# Patient Record
Sex: Female | Born: 1965
Health system: Southern US, Community
[De-identification: ages and names within clinical notes are randomized; demographics above are authoritative.]

## PROBLEM LIST (undated history)

## (undated) DIAGNOSIS — C50919 Malignant neoplasm of unspecified site of unspecified female breast: Secondary | ICD-10-CM

## (undated) DIAGNOSIS — D649 Anemia, unspecified: Secondary | ICD-10-CM

## (undated) DIAGNOSIS — F419 Anxiety disorder, unspecified: Secondary | ICD-10-CM

## (undated) DIAGNOSIS — R232 Flushing: Secondary | ICD-10-CM

## (undated) DIAGNOSIS — N760 Acute vaginitis: Principal | ICD-10-CM

## (undated) DIAGNOSIS — C801 Malignant (primary) neoplasm, unspecified: Secondary | ICD-10-CM

## (undated) DIAGNOSIS — T7840XA Allergy, unspecified, initial encounter: Secondary | ICD-10-CM

## (undated) DIAGNOSIS — Z9889 Other specified postprocedural states: Secondary | ICD-10-CM

## (undated) DIAGNOSIS — Z8619 Personal history of other infectious and parasitic diseases: Secondary | ICD-10-CM

## (undated) DIAGNOSIS — B019 Varicella without complication: Secondary | ICD-10-CM

## (undated) DIAGNOSIS — N390 Urinary tract infection, site not specified: Principal | ICD-10-CM

## (undated) DIAGNOSIS — Z8709 Personal history of other diseases of the respiratory system: Secondary | ICD-10-CM

## (undated) DIAGNOSIS — Z1501 Genetic susceptibility to malignant neoplasm of breast: Secondary | ICD-10-CM

## (undated) DIAGNOSIS — R112 Nausea with vomiting, unspecified: Secondary | ICD-10-CM

## (undated) DIAGNOSIS — Z0000 Encounter for general adult medical examination without abnormal findings: Secondary | ICD-10-CM

## (undated) DIAGNOSIS — G4700 Insomnia, unspecified: Secondary | ICD-10-CM

## (undated) DIAGNOSIS — Z1509 Genetic susceptibility to other malignant neoplasm: Secondary | ICD-10-CM

## (undated) DIAGNOSIS — Z9221 Personal history of antineoplastic chemotherapy: Secondary | ICD-10-CM

## (undated) DIAGNOSIS — M810 Age-related osteoporosis without current pathological fracture: Secondary | ICD-10-CM

## (undated) HISTORY — DX: Personal history of other diseases of the respiratory system: Z87.09

## (undated) HISTORY — DX: Other specified postprocedural states: R11.2

## (undated) HISTORY — DX: Nausea with vomiting, unspecified: Z98.890

## (undated) HISTORY — PX: VARICOSE VEIN SURGERY: SHX832

## (undated) HISTORY — DX: Flushing: R23.2

## (undated) HISTORY — DX: Insomnia, unspecified: G47.00

## (undated) HISTORY — DX: Anemia, unspecified: D64.9

## (undated) HISTORY — DX: Genetic susceptibility to other malignant neoplasm: Z15.09

## (undated) HISTORY — DX: Personal history of other infectious and parasitic diseases: Z86.19

## (undated) HISTORY — PX: POLYPECTOMY: SHX149

## (undated) HISTORY — PX: COLONOSCOPY: SHX174

## (undated) HISTORY — DX: Allergy, unspecified, initial encounter: T78.40XA

## (undated) HISTORY — DX: Age-related osteoporosis without current pathological fracture: M81.0

## (undated) HISTORY — DX: Personal history of antineoplastic chemotherapy: Z92.21

## (undated) HISTORY — DX: Acute vaginitis: N76.0

## (undated) HISTORY — DX: Genetic susceptibility to malignant neoplasm of breast: Z15.01

## (undated) HISTORY — DX: Urinary tract infection, site not specified: N39.0

## (undated) HISTORY — DX: Encounter for general adult medical examination without abnormal findings: Z00.00

## (undated) HISTORY — DX: Varicella without complication: B01.9

## (undated) HISTORY — PX: TUBAL LIGATION: SHX77

## (undated) HISTORY — DX: Anxiety disorder, unspecified: F41.9

## (undated) HISTORY — DX: Malignant neoplasm of unspecified site of unspecified female breast: C50.919

---

## 1995-01-29 HISTORY — PX: MOUTH SURGERY: SHX715

## 1997-11-04 ENCOUNTER — Inpatient Hospital Stay (HOSPITAL_COMMUNITY): Admission: AD | Admit: 1997-11-04 | Discharge: 1997-11-08 | Payer: Self-pay | Admitting: Obstetrics and Gynecology

## 2000-07-07 ENCOUNTER — Other Ambulatory Visit: Admission: RE | Admit: 2000-07-07 | Discharge: 2000-07-07 | Payer: Self-pay | Admitting: Obstetrics and Gynecology

## 2001-07-09 ENCOUNTER — Other Ambulatory Visit: Admission: RE | Admit: 2001-07-09 | Discharge: 2001-07-09 | Payer: Self-pay | Admitting: Obstetrics and Gynecology

## 2001-07-28 ENCOUNTER — Ambulatory Visit (HOSPITAL_COMMUNITY): Admission: RE | Admit: 2001-07-28 | Discharge: 2001-07-28 | Payer: Self-pay | Admitting: Obstetrics and Gynecology

## 2001-07-28 ENCOUNTER — Encounter: Payer: Self-pay | Admitting: Obstetrics and Gynecology

## 2002-07-13 ENCOUNTER — Other Ambulatory Visit: Admission: RE | Admit: 2002-07-13 | Discharge: 2002-07-13 | Payer: Self-pay | Admitting: Obstetrics and Gynecology

## 2003-11-28 ENCOUNTER — Other Ambulatory Visit: Admission: RE | Admit: 2003-11-28 | Discharge: 2003-11-28 | Payer: Self-pay | Admitting: Obstetrics and Gynecology

## 2004-11-29 ENCOUNTER — Other Ambulatory Visit: Admission: RE | Admit: 2004-11-29 | Discharge: 2004-11-29 | Payer: Self-pay | Admitting: Obstetrics and Gynecology

## 2005-12-03 ENCOUNTER — Other Ambulatory Visit: Admission: RE | Admit: 2005-12-03 | Discharge: 2005-12-03 | Payer: Self-pay | Admitting: Obstetrics and Gynecology

## 2006-10-02 ENCOUNTER — Ambulatory Visit (HOSPITAL_COMMUNITY): Admission: RE | Admit: 2006-10-02 | Discharge: 2006-10-02 | Payer: Self-pay | Admitting: Obstetrics and Gynecology

## 2006-12-04 ENCOUNTER — Other Ambulatory Visit: Admission: RE | Admit: 2006-12-04 | Discharge: 2006-12-04 | Payer: Self-pay | Admitting: Obstetrics and Gynecology

## 2007-01-29 LAB — HM MAMMOGRAPHY

## 2007-12-11 ENCOUNTER — Other Ambulatory Visit: Admission: RE | Admit: 2007-12-11 | Discharge: 2007-12-11 | Payer: Self-pay | Admitting: Obstetrics and Gynecology

## 2008-01-19 ENCOUNTER — Ambulatory Visit (HOSPITAL_COMMUNITY): Admission: RE | Admit: 2008-01-19 | Discharge: 2008-01-19 | Payer: Self-pay | Admitting: Obstetrics and Gynecology

## 2008-10-21 ENCOUNTER — Ambulatory Visit: Payer: Self-pay | Admitting: Family Medicine

## 2008-10-21 DIAGNOSIS — N3000 Acute cystitis without hematuria: Secondary | ICD-10-CM | POA: Insufficient documentation

## 2008-10-22 ENCOUNTER — Encounter: Payer: Self-pay | Admitting: Family Medicine

## 2008-12-11 ENCOUNTER — Other Ambulatory Visit: Admission: RE | Admit: 2008-12-11 | Discharge: 2008-12-11 | Payer: Self-pay | Admitting: Obstetrics and Gynecology

## 2010-11-17 ENCOUNTER — Encounter: Payer: Self-pay | Admitting: Emergency Medicine

## 2010-11-17 ENCOUNTER — Inpatient Hospital Stay (INDEPENDENT_AMBULATORY_CARE_PROVIDER_SITE_OTHER)
Admission: RE | Admit: 2010-11-17 | Discharge: 2010-11-17 | Disposition: A | Discharge status: Home / Self Care | Payer: BC Managed Care – PPO | Source: Ambulatory Visit | Attending: Emergency Medicine | Admitting: Emergency Medicine

## 2010-11-17 DIAGNOSIS — L2089 Other atopic dermatitis: Secondary | ICD-10-CM

## 2010-11-19 ENCOUNTER — Encounter: Payer: Self-pay | Admitting: Family Medicine

## 2010-12-03 ENCOUNTER — Encounter: Payer: Self-pay | Admitting: Family Medicine

## 2010-12-03 ENCOUNTER — Ambulatory Visit (INDEPENDENT_AMBULATORY_CARE_PROVIDER_SITE_OTHER): Payer: BC Managed Care – PPO | Admitting: Family Medicine

## 2010-12-03 DIAGNOSIS — T7840XA Allergy, unspecified, initial encounter: Secondary | ICD-10-CM

## 2010-12-03 DIAGNOSIS — Z0000 Encounter for general adult medical examination without abnormal findings: Secondary | ICD-10-CM

## 2010-12-03 DIAGNOSIS — L509 Urticaria, unspecified: Secondary | ICD-10-CM

## 2010-12-03 DIAGNOSIS — Z8709 Personal history of other diseases of the respiratory system: Secondary | ICD-10-CM

## 2010-12-03 DIAGNOSIS — Z8619 Personal history of other infectious and parasitic diseases: Secondary | ICD-10-CM | POA: Insufficient documentation

## 2010-12-03 NOTE — Patient Instructions (Signed)

## 2010-12-04 ENCOUNTER — Encounter: Payer: Self-pay | Admitting: Family Medicine

## 2010-12-04 DIAGNOSIS — T7840XA Allergy, unspecified, initial encounter: Secondary | ICD-10-CM

## 2010-12-04 DIAGNOSIS — Z0000 Encounter for general adult medical examination without abnormal findings: Secondary | ICD-10-CM

## 2010-12-04 HISTORY — DX: Encounter for general adult medical examination without abnormal findings: Z00.00

## 2010-12-04 HISTORY — DX: Allergy, unspecified, initial encounter: T78.40XA

## 2010-12-04 NOTE — Progress Notes (Signed)
Abigail [name] [id number] Oct 01, 1965 12/04/2010      Progress Note New Patient  Subjective  Chief Complaint  Chief Complaint  Patient presents with  . Establish Care    new patient    HPI  Patient is a 45 year old Caucasian female who is in today for new patient appointment. She has recently had an episode of significant urticaria. She was seen in urgent care and ultimately by an allergist. Was treated with Benadryl, Claritin, Zantac and ultimately high doses of prednisone taper before symptoms resolved. She denies having had any new foods or products. They have chosen not to proceed with allergy workup unless symptoms become recurrent. At present no rash or pruritus is noted. Part of this episode she reports having been in good health and has not had any recent fevers, chills, headache, chest pain, palpitations, shortness of breath, GI or GU complaints. She notes as a teen young adult she had several episodes of mononucleosis and strep throat but has not had any recent difficulties.  Past Medical History  Diagnosis Date  . Chicken pox as a child  . Allergy     seasonal- spring  . Urticaria   . History of streptococcal pharyngitis   . History of mononucleosis   . Anemia     mild  . Allergic state 12/04/2010  . Preventative health care 12/04/2010    Past Surgical History  Procedure Date  . Mouth surgery 1997    gum surgery  . Cesarean section 1999    X 1  . Varicose vein surgery     [place] [facility]    Family History  Problem Relation Age of Onset  . Adopted: Yes    History   Social History  . Marital Status: Married    Spouse Name: N/A    Number of Children: N/A  . Years of Education: N/A   Occupational History  . Not on file.   Social History Main Topics  . Smoking status: Never Smoker   . Smokeless tobacco: Never Used  . Alcohol Use: Yes     occasionally- social  . Drug Use: No  . Sexually Active: Not on file   Other Topics Concern  . Not  on file   Social History Narrative  . No narrative on file     Allergies  Allergen Reactions  . Penicillins     Review of Systems  Review of Systems  Constitutional: Negative for fever, chills and malaise/fatigue.  HENT: Negative for hearing loss, nosebleeds and congestion.   Eyes: Negative for discharge.  Respiratory: Negative for cough, sputum production, shortness of breath and wheezing.   Cardiovascular: Negative for chest pain, palpitations and leg swelling.  Gastrointestinal: Negative for heartburn, nausea, vomiting, abdominal pain, diarrhea, constipation and blood in stool.  Genitourinary: Negative for dysuria, urgency, frequency and hematuria.  Musculoskeletal: Negative for myalgias, back pain and falls.  Skin: Positive for itching and rash.  Neurological: Negative for dizziness, tremors, sensory change, focal weakness, loss of consciousness, weakness and headaches.  Endo/Heme/Allergies: Negative for polydipsia. Does not bruise/bleed easily.  Psychiatric/Behavioral: Negative for depression and suicidal ideas. The patient is not nervous/anxious and does not have insomnia.     Objective  BP 112/74  Pulse 67  Temp(Src) 98 F (36.7 C) (Oral)  Ht 5\' 5"  (1.651 m)  Wt 132 lb (59.875 kg)  BMI 21.97 kg/m2  SpO2 100%  LMP 11/19/2010  Physical Exam  Physical Exam  Constitutional: She is oriented to person, place,  and time and well-developed, well-nourished, and in no distress. No distress.  HENT:  Head: Normocephalic and atraumatic.  Right Ear: External ear normal.  Left Ear: External ear normal.  Nose: Nose normal.  Mouth/Throat: Oropharynx is clear and moist. No oropharyngeal exudate.  Eyes: Conjunctivae are normal. Pupils are equal, round, and reactive to light. Right eye exhibits no discharge. Left eye exhibits no discharge. No scleral icterus.  Neck: Normal range of motion. Neck supple. No thyromegaly present.  Cardiovascular: Normal rate, regular rhythm, normal  heart sounds and intact distal pulses.   No murmur heard. Pulmonary/Chest: Effort normal and breath sounds normal. No respiratory distress. She has no wheezes. She has no rales.  Abdominal: Soft. Bowel sounds are normal. She exhibits no distension and no mass. There is no tenderness.  Musculoskeletal: Normal range of motion. She exhibits no edema and no tenderness.  Lymphadenopathy:    She has no cervical adenopathy.  Neurological: She is alert and oriented to person, place, and time. She has normal reflexes. No cranial nerve deficit. Coordination normal.  Skin: Skin is warm and dry. No rash noted. She is not diaphoretic.  Psychiatric: Mood, memory and affect normal.       Assessment & Plan  Allergic state Previous history of mild seasonal allergies, encouraged to use OTC nonsedating antihistamines prn  Urticaria Had a recent episode of difficult to treat urticaria, requiring a hi dose of steroids before resolving. No concerns today. She did see a allergist in [facility] for this but they decided not to proceed with allergy testing unless this becomes recurrent. She has antihistamines on hand if symptoms recur. She denies any new products or foods before this occurred.  History of streptococcal pharyngitis Has not had any recent episodes but has had some episodes in adulthood.  Preventative health care We will request old records and and have her return in 2 months for her annual pap and fasting labs.

## 2010-12-04 NOTE — Assessment & Plan Note (Signed)
Had a recent episode of difficult to treat urticaria, requiring a hi dose of steroids before resolving. No concerns today. She did see a allergist in [facility] for this but they decided not to proceed with allergy testing unless this becomes recurrent. She has antihistamines on hand if symptoms recur. She denies any new products or foods before this occurred.

## 2010-12-04 NOTE — Assessment & Plan Note (Signed)
Previous history of mild seasonal allergies, encouraged to use OTC nonsedating antihistamines prn

## 2010-12-04 NOTE — Assessment & Plan Note (Signed)
Has not had any recent episodes but has had some episodes in adulthood.

## 2010-12-04 NOTE — Assessment & Plan Note (Signed)
We will request old records and and have her return in 2 months for her annual pap and fasting labs.

## 2010-12-31 NOTE — Progress Notes (Signed)
Summary: ALLERGIC REACTION(rm 3)   Vital Signs:  Patient Profile:   45 Years Old Female CC:      allergic reaction Height:     66 inches Weight:      132 pounds O2 Sat:      100 % O2 treatment:    Room Air Temp:     98.3 degrees F oral Pulse rate:   74 / minute Resp:     20 per minute BP sitting:   107 / 73  (left arm) Cuff size:   regular  Vitals Entered By: Linton Flemings RN (November 17, 2010 12:47 PM)                  Updated Prior Medication List: MULTIVITAMINS  CAPS (MULTIPLE VITAMIN) 1 qd Calcium Iron Current Allergies: ! PENICILLINHistory of Present Illness Chief Complaint: allergic reaction History of Present Illness: Allergic reaction and redness on skin all over body.  No new soaps, shampoos, detergents.  No spicy foods.  She is allergic to animals but hasn't touched any recently.  She is allergic to PCN but hasn't taken any.  Very itchy mostly on legs and torso.  REVIEW OF SYSTEMS Constitutional Symptoms      Denies fever, chills, night sweats, weight loss, weight gain, and fatigue.  Eyes       Denies change in vision, eye pain, eye discharge, glasses, contact lenses, and eye surgery. Ear/Nose/Throat/Mouth       Denies hearing loss/aids, change in hearing, ear pain, ear discharge, dizziness, frequent runny nose, frequent nose bleeds, sinus problems, sore throat, hoarseness, and tooth pain or bleeding.  Respiratory       Denies dry cough, productive cough, wheezing, shortness of breath, asthma, bronchitis, and emphysema/COPD.  Cardiovascular       Denies murmurs, chest pain, and tires easily with exhertion.    Gastrointestinal       Denies stomach pain, nausea/vomiting, diarrhea, constipation, blood in bowel movements, and indigestion. Genitourniary       Denies painful urination, kidney stones, and loss of urinary control. Neurological       Denies paralysis, seizures, and fainting/blackouts. Musculoskeletal       Denies muscle pain, joint pain, joint  stiffness, decreased range of motion, redness, swelling, muscle weakness, and gout.  Skin       Denies bruising, unusual mles/lumps or sores, and hair/skin or nail changes.  Psych       Denies mood changes, temper/anger issues, anxiety/stress, speech problems, depression, and sleep problems. Other Comments: rash noted last night   Family History: Reviewed history from 10/21/2008 and no changes required. Adopted, Family HX unk  Social History: Reviewed history from 10/21/2008 and no changes required. 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 Patient Level III [phone] Solumedrol up to 125mg  [J2930] Admin of Therapeutic Inj  intramuscular or subcutaneous [96372] Planning Comments:   Hypoallergenic soaps, etc.  Cool compresses.  Solumedrol 125 now, then pred pack starting tomorrow.  OTC antihistamines as well.  Follow-up with your primary care physician or allergist  if not improving or if getting worse.   The patient and/or caregiver has been counseled thoroughly with regard to medications prescribed including dosage, schedule, interactions, rationale for use, and possible side effects and they verbalize understanding.  Diagnoses and expected course of recovery discussed and will return if not improved as expected or if the condition worsens. Patient and/or caregiver verbalized understanding.  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 Patient Level III [56213] 2)  Solumedrol up to 125mg  [J2930] 3)  Admin of Therapeutic Inj  intramuscular or subcutaneous [08657]

## 2010-12-31 NOTE — Progress Notes (Signed)
Summary: Persistent Hives  Patient complains of persistent hives, not controlled with Benadryl and Pepcid.Does have appt. today with Allergy/Immunology in [place]. Reports taking Rx for Prednisone as directed.  Plan:   Switch to VISTARIL 50 MG CAPS (HYDROXYZINE PAMOATE) One by mouth q6 to 8hr as needed itching.  May continue Pepcid. Arrange referral to dermatologist. Donna Christen MD  November 19, 2010 9:18 AM      Prescriptions: VISTARIL 50 MG CAPS (HYDROXYZINE PAMOATE) One by mouth q6 to 8hr for itching  #30 x 1   Entered and Authorized by:   Donna Christen MD   Signed by:   Donna Christen MD on 11/19/2010   Method used:   Electronically to        [facility] (retail)       [address]       [place], [place]  [address]       Ph: [phone]       Fax: [phone]   RxID:   [id number]

## 2011-01-31 ENCOUNTER — Other Ambulatory Visit: Payer: Self-pay | Admitting: Family Medicine

## 2011-01-31 DIAGNOSIS — Z1231 Encounter for screening mammogram for malignant neoplasm of breast: Secondary | ICD-10-CM

## 2011-02-01 ENCOUNTER — Ambulatory Visit (HOSPITAL_COMMUNITY)
Admission: RE | Admit: 2011-02-01 | Discharge: 2011-02-01 | Disposition: A | Discharge status: Home / Self Care | Payer: BC Managed Care – PPO | Source: Ambulatory Visit | Attending: Family Medicine | Admitting: Family Medicine

## 2011-02-01 DIAGNOSIS — Z1231 Encounter for screening mammogram for malignant neoplasm of breast: Secondary | ICD-10-CM

## 2011-02-08 ENCOUNTER — Other Ambulatory Visit: Payer: Self-pay | Admitting: Family Medicine

## 2011-02-08 DIAGNOSIS — R928 Other abnormal and inconclusive findings on diagnostic imaging of breast: Secondary | ICD-10-CM

## 2011-02-11 ENCOUNTER — Encounter: Payer: Self-pay | Admitting: Family Medicine

## 2011-02-11 ENCOUNTER — Ambulatory Visit (INDEPENDENT_AMBULATORY_CARE_PROVIDER_SITE_OTHER): Payer: BC Managed Care – PPO | Admitting: Family Medicine

## 2011-02-11 ENCOUNTER — Other Ambulatory Visit (HOSPITAL_COMMUNITY)
Admission: RE | Admit: 2011-02-11 | Discharge: 2011-02-11 | Disposition: A | Discharge status: Home / Self Care | Payer: BC Managed Care – PPO | Source: Ambulatory Visit | Attending: Family Medicine | Admitting: Family Medicine

## 2011-02-11 VITALS — BP 104/68 | HR 64 | Temp 97.5°F | Ht 65.0 in | Wt 132.8 lb

## 2011-02-11 DIAGNOSIS — N760 Acute vaginitis: Secondary | ICD-10-CM | POA: Insufficient documentation

## 2011-02-11 DIAGNOSIS — F419 Anxiety disorder, unspecified: Secondary | ICD-10-CM

## 2011-02-11 DIAGNOSIS — Z0000 Encounter for general adult medical examination without abnormal findings: Secondary | ICD-10-CM

## 2011-02-11 DIAGNOSIS — Z23 Encounter for immunization: Secondary | ICD-10-CM

## 2011-02-11 DIAGNOSIS — G4700 Insomnia, unspecified: Secondary | ICD-10-CM | POA: Insufficient documentation

## 2011-02-11 DIAGNOSIS — G4726 Circadian rhythm sleep disorder, shift work type: Secondary | ICD-10-CM

## 2011-02-11 DIAGNOSIS — Z01419 Encounter for gynecological examination (general) (routine) without abnormal findings: Secondary | ICD-10-CM | POA: Insufficient documentation

## 2011-02-11 DIAGNOSIS — F411 Generalized anxiety disorder: Secondary | ICD-10-CM

## 2011-02-11 HISTORY — DX: Anxiety disorder, unspecified: F41.9

## 2011-02-11 HISTORY — DX: Insomnia, unspecified: G47.00

## 2011-02-11 LAB — RENAL FUNCTION PANEL
Creatinine, Ser: 0.8 mg/dL (ref 0.4–1.2)
GFR: 86.12 mL/min (ref >60.00)
Glucose, Bld: 90 mg/dL (ref 70–99)
Phosphorus: 3.1 mg/dL (ref 2.3–4.6)
Sodium: 141 mEq/L (ref 135–145)

## 2011-02-11 LAB — CBC
HCT: 36.9 % (ref 36.0–46.0)
MCV: 95.6 fl (ref 78.0–100.0)
RDW: 12.3 % (ref 11.5–14.6)
WBC: 4.6 10*3/uL (ref 4.5–10.5)

## 2011-02-11 LAB — LIPID PANEL
Cholesterol: 137 mg/dL (ref 0–200)
HDL: 63.7 mg/dL (ref >39.00)
VLDL: 10.2 mg/dL (ref 0.0–40.0)

## 2011-02-11 LAB — TSH: TSH: 0.44 u[IU]/mL (ref 0.35–5.50)

## 2011-02-11 LAB — HEPATIC FUNCTION PANEL: Total Bilirubin: 1 mg/dL (ref 0.3–1.2)

## 2011-02-11 MED ORDER — ZOLPIDEM TARTRATE 10 MG PO TABS: 10.0000 mg | ORAL_TABLET | Freq: Every evening | ORAL | Status: DC | PRN

## 2011-02-11 MED ORDER — LORAZEPAM 0.5 MG PO TABS: 0.5000 mg | ORAL_TABLET | Freq: Two times a day (BID) | ORAL | Status: AC | PRN

## 2011-02-11 MED ORDER — LORAZEPAM 0.5 MG PO TABS: 0.5000 mg | ORAL_TABLET | Freq: Two times a day (BID) | ORAL | Status: DC | PRN

## 2011-02-11 NOTE — Patient Instructions (Signed)

## 2011-02-11 NOTE — Assessment & Plan Note (Signed)
Given a small amount of Lorazepam to use prn for overwhelming episodes, is presently working, caring for her kids and taking care of her ailing mother, feels overwhelmed at times. If she finds the symptoms are worsening she may come back in for further evaluation

## 2011-02-11 NOTE — Assessment & Plan Note (Signed)
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

## 2011-02-11 NOTE — Progress Notes (Signed)
Patient ID: Abigail [name], female   DOB: Nov 04, 1965, 46 y.o.   MRN: [id number] Abigail [name] [id number] 1965/10/05 02/11/2011      Progress Note New Patient  Subjective  Chief Complaint  Chief Complaint  Patient presents with  . Annual Exam    physical  . Gynecologic Exam    pap    HPI  Patient is a 46 year old Caucasian female in today for annual GYN exam. She is generally in good health but does not appear concerned. this she had her mammogram on January 4 and has yet to hear her the results. She is notified them up today. No breast complaints, pain, discharge, skin changes or masses  She tried it at her first child but had that bout of mastitis early on and had to stop. Never nursed after that. Denies any history of abnormal Paps. He is under a great deal of stress as the uterus and also with 2 teenagers at home and now caring for an ailing mother. She's having some episodes occasionally of palpitations and diaphoresis she is aware these are anxiety related but they are occurring with some frequency. No chest pain, palpitations, GI or GU complaints. No fevers, chills or recent illness, no GU or GI c/o.  Past Medical History  Diagnosis Date  . Chicken pox as a child  . Allergy     seasonal- spring  . Urticaria   . History of streptococcal pharyngitis   . History of mononucleosis   . Anemia     mild  . Allergic state 12/04/2010  . Preventative health care 12/04/2010  . Anxiety 02/11/2011  . Insomnia 02/11/2011    Past Surgical History  Procedure Date  . Mouth surgery 1997    gum surgery  . Cesarean section 1999    X 1  . Varicose vein surgery     [facility]    Family History  Problem Relation Age of Onset  . Adopted: Yes    History   Social History  . Marital Status: Married    Spouse Name: N/A    Number of Children: N/A  . Years of Education: N/A   Occupational History  . Not on file.   Social History Main Topics  . Smoking status:  Never Smoker   . Smokeless tobacco: Never Used  . Alcohol Use: Yes     occasionally- social  . Drug Use: No  . Sexually Active: Not on file   Other Topics Concern  . Not on file   Social History Narrative  . No narrative on file    Current Outpatient Prescriptions on File Prior to Visit  Medication Sig Dispense Refill  . Calcium Carbonate-Vitamin D (CALCIUM + D PO) Take 1 tablet by mouth 2 (two) times daily.        . Multiple Vitamin (MULTIVITAMIN) tablet Take 1 tablet by mouth daily.          Allergies  Allergen Reactions  . Penicillins     Review of Systems  Review of Systems  Constitutional: Negative for fever, chills and malaise/fatigue.  HENT: Negative for hearing loss, nosebleeds and congestion.   Eyes: Negative for discharge.  Respiratory: Negative for cough, sputum production, shortness of breath and wheezing.   Cardiovascular: Negative for chest pain, palpitations and leg swelling.  Gastrointestinal: Negative for heartburn, nausea, vomiting, abdominal pain, diarrhea, constipation and blood in stool.  Genitourinary: Negative for dysuria, urgency, frequency and hematuria.  Musculoskeletal: Negative for myalgias, back pain  and falls.  Skin: Positive for rash.       Left cheek in rectal area occurs around her menses monthly  Neurological: Negative for dizziness, tremors, sensory change, focal weakness, loss of consciousness, weakness and headaches.  Endo/Heme/Allergies: Negative for polydipsia. Does not bruise/bleed easily.  Psychiatric/Behavioral: Negative for depression and suicidal ideas. The patient is nervous/anxious and has insomnia.     Objective  BP 104/68  Pulse 64  Temp(Src) 97.5 F (36.4 C) (Temporal)  Ht 5\' 5"  (1.651 m)  Wt 132 lb 12.8 oz (60.238 kg)  BMI 22.10 kg/m2  SpO2 98%  LMP 01/30/2011  Physical Exam  Physical Exam  Constitutional: She is oriented to person, place, and time and well-developed, well-nourished, and in no distress. No  distress.  HENT:  Head: Normocephalic and atraumatic.  Right Ear: External ear normal.  Left Ear: External ear normal.  Nose: Nose normal.  Mouth/Throat: Oropharynx is clear and moist. No oropharyngeal exudate.  Eyes: Conjunctivae are normal. Pupils are equal, round, and reactive to light. Right eye exhibits no discharge. Left eye exhibits no discharge. No scleral icterus.  Neck: Normal range of motion. Neck supple. No thyromegaly present.  Cardiovascular: Normal rate, regular rhythm, normal heart sounds and intact distal pulses.   No murmur heard. Pulmonary/Chest: Effort normal and breath sounds normal. No respiratory distress. She has no wheezes. She has no rales.  Abdominal: Soft. Bowel sounds are normal. She exhibits no distension and no mass. There is no tenderness.  Genitourinary: Uterus normal, cervix normal, right adnexa normal and left adnexa normal. Vaginal discharge found.       Thin, grayish vaginal discharge. Skin breakdown left rectal cheek, scabbing, no surrounding fluctuance or erythema  Musculoskeletal: Normal range of motion. She exhibits no edema and no tenderness.  Lymphadenopathy:    She has no cervical adenopathy.  Neurological: She is alert and oriented to person, place, and time. She has normal reflexes. No cranial nerve deficit. Coordination normal.  Skin: Skin is warm and dry. No rash noted. She is not diaphoretic.  Psychiatric: Mood, memory and affect normal.       Assessment & Plan  Preventative health care Patient in today for pap, did have MGM which was mildly abnormal on the left with some calcifications, patient needs further imaging and she is made aware, she works at [employer] and agrees to set up further imaging herself. Did have some rectal irritation that tends to occur each month at a certain time with chafing she is encouraged to cleanse area with [facility] Astringent and apply desitin with Zinc. If no resolution may need further  evaluation Tdap given today. 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 If she finds the symptoms are worsening she may come back in for further evaluation

## 2011-02-11 NOTE — Assessment & Plan Note (Addendum)
Patient in today for pap, did have MGM which was mildly abnormal on the left with some calcifications, patient needs further imaging and she is made aware, she works at [employer] and agrees to set up further imaging herself. Did have some rectal irritation that tends to occur each month at a certain time with chafing she is encouraged to cleanse area with [facility] Astringent and apply desitin with Zinc. If no resolution may need further evaluation Tdap given today. Fasting labs drawn at visit

## 2011-02-21 ENCOUNTER — Ambulatory Visit
Admission: RE | Admit: 2011-02-21 | Discharge: 2011-02-21 | Disposition: A | Discharge status: Home / Self Care | Payer: BC Managed Care – PPO | Source: Ambulatory Visit | Attending: Family Medicine | Admitting: Family Medicine

## 2011-02-21 ENCOUNTER — Other Ambulatory Visit: Payer: Self-pay | Admitting: Diagnostic Radiology

## 2011-02-21 ENCOUNTER — Other Ambulatory Visit: Payer: Self-pay | Admitting: Family Medicine

## 2011-02-21 DIAGNOSIS — R928 Other abnormal and inconclusive findings on diagnostic imaging of breast: Secondary | ICD-10-CM

## 2011-02-21 DIAGNOSIS — C801 Malignant (primary) neoplasm, unspecified: Secondary | ICD-10-CM

## 2011-02-21 HISTORY — DX: Malignant (primary) neoplasm, unspecified: C80.1

## 2011-02-22 ENCOUNTER — Ambulatory Visit
Admission: RE | Admit: 2011-02-22 | Discharge: 2011-02-22 | Disposition: A | Discharge status: Home / Self Care | Payer: BC Managed Care – PPO | Source: Ambulatory Visit | Attending: Family Medicine | Admitting: Family Medicine

## 2011-02-22 ENCOUNTER — Other Ambulatory Visit: Payer: Self-pay | Admitting: Family Medicine

## 2011-02-22 DIAGNOSIS — R928 Other abnormal and inconclusive findings on diagnostic imaging of breast: Secondary | ICD-10-CM

## 2011-02-22 DIAGNOSIS — C50912 Malignant neoplasm of unspecified site of left female breast: Secondary | ICD-10-CM

## 2011-02-25 ENCOUNTER — Telehealth: Payer: Self-pay | Admitting: *Deleted

## 2011-02-25 ENCOUNTER — Other Ambulatory Visit: Payer: Self-pay | Admitting: *Deleted

## 2011-02-25 DIAGNOSIS — C50219 Malignant neoplasm of upper-inner quadrant of unspecified female breast: Secondary | ICD-10-CM

## 2011-02-25 NOTE — Telephone Encounter (Signed)
Confirmed [facility] for 02/27/11 at 1215 .  Instructions and contact information given.  

## 2011-02-26 ENCOUNTER — Ambulatory Visit
Admission: RE | Admit: 2011-02-26 | Discharge: 2011-02-26 | Disposition: A | Discharge status: Home / Self Care | Payer: BC Managed Care – PPO | Source: Ambulatory Visit | Attending: Family Medicine | Admitting: Family Medicine

## 2011-02-26 DIAGNOSIS — C50912 Malignant neoplasm of unspecified site of left female breast: Secondary | ICD-10-CM

## 2011-02-26 MED ORDER — GADOBENATE DIMEGLUMINE 529 MG/ML IV SOLN: 12.0000 mL | Freq: Once | INTRAVENOUS | Status: AC | PRN

## 2011-02-27 ENCOUNTER — Other Ambulatory Visit (HOSPITAL_BASED_OUTPATIENT_CLINIC_OR_DEPARTMENT_OTHER): Payer: BC Managed Care – PPO

## 2011-02-27 ENCOUNTER — Encounter: Payer: Self-pay | Admitting: *Deleted

## 2011-02-27 ENCOUNTER — Ambulatory Visit (HOSPITAL_BASED_OUTPATIENT_CLINIC_OR_DEPARTMENT_OTHER): Payer: BC Managed Care – PPO | Admitting: Oncology

## 2011-02-27 ENCOUNTER — Ambulatory Visit: Payer: BC Managed Care – PPO | Attending: General Surgery | Admitting: Physical Therapy

## 2011-02-27 ENCOUNTER — Ambulatory Visit
Admission: RE | Admit: 2011-02-27 | Discharge: 2011-02-27 | Disposition: A | Discharge status: Home / Self Care | Payer: BC Managed Care – PPO | Source: Ambulatory Visit | Attending: Radiation Oncology | Admitting: Radiation Oncology

## 2011-02-27 ENCOUNTER — Encounter (INDEPENDENT_AMBULATORY_CARE_PROVIDER_SITE_OTHER): Payer: Self-pay | Admitting: General Surgery

## 2011-02-27 ENCOUNTER — Encounter: Payer: Self-pay | Admitting: Radiation Oncology

## 2011-02-27 ENCOUNTER — Ambulatory Visit (HOSPITAL_BASED_OUTPATIENT_CLINIC_OR_DEPARTMENT_OTHER): Payer: BC Managed Care – PPO | Admitting: General Surgery

## 2011-02-27 ENCOUNTER — Ambulatory Visit: Payer: BC Managed Care – PPO

## 2011-02-27 DIAGNOSIS — IMO0001 Reserved for inherently not codable concepts without codable children: Secondary | ICD-10-CM | POA: Insufficient documentation

## 2011-02-27 DIAGNOSIS — G5600 Carpal tunnel syndrome, unspecified upper limb: Secondary | ICD-10-CM | POA: Insufficient documentation

## 2011-02-27 DIAGNOSIS — C50219 Malignant neoplasm of upper-inner quadrant of unspecified female breast: Secondary | ICD-10-CM

## 2011-02-27 DIAGNOSIS — C50919 Malignant neoplasm of unspecified site of unspecified female breast: Secondary | ICD-10-CM | POA: Insufficient documentation

## 2011-02-27 DIAGNOSIS — R209 Unspecified disturbances of skin sensation: Secondary | ICD-10-CM | POA: Insufficient documentation

## 2011-02-27 LAB — COMPREHENSIVE METABOLIC PANEL
ALT: 14 U/L (ref 0–35)
BUN: 21 mg/dL (ref 6–23)
CO2: 30 mEq/L (ref 19–32)
Creatinine, Ser: 0.79 mg/dL (ref 0.50–1.10)
Total Bilirubin: 0.3 mg/dL (ref 0.3–1.2)

## 2011-02-27 LAB — CBC WITH DIFFERENTIAL/PLATELET
BASO%: 0.3 % (ref 0.0–2.0)
Basophils Absolute: 0 10*3/uL (ref 0.0–0.1)
EOS%: 3.2 % (ref 0.0–7.0)
HCT: 34.7 % — ABNORMAL LOW (ref 34.8–46.6)
LYMPH%: 30.5 % (ref 14.0–49.7)
MCH: 32.1 pg (ref 25.1–34.0)
MCHC: 33.7 g/dL (ref 31.5–36.0)
MONO#: 0.3 10*3/uL (ref 0.1–0.9)
NEUT%: 59.1 % (ref 38.4–76.8)
Platelets: 218 10*3/uL (ref 145–400)

## 2011-02-27 NOTE — Progress Notes (Signed)
[facility] Radiation Oncology NEW PATIENT EVALUATION  Name: Abigail [name] MRN: [id number]  Date: 02/27/2011  DOB: 24-Dec-1965  Status: outpatient   CC: Danise Edge, MD, MD  Robyne Askew, MD  GUS MAGRINAT MD   REFERRING PHYSICIAN: Robyne Askew, MD   DIAGNOSIS: The encounter diagnosis was Cancer of upper-inner quadrant of female breast. clinical T2 N0 M0, this is ER/PR positive HER-2/neu negative; Ki-67 30%; grade 2 invasive ductal carcinoma    HISTORY OF PRESENT ILLNESS:  Abigail [name] is a 46 y.o. female who reports that at age 89 she had her first mammogram due to breast engorgement several weeks after she stopped breast-feeding her first child. She had a subsequent mammogram with her second child when she experienced persistent breast engorgement as well. However she started having annual mammograms in her early 61s. She was found on screening mammogram most recently to have a area measuring 1.1 cm, consisting of a cluster of calcifications,in the upper inner quadrant of the left breast.the patient underwent biopsy of this region on 02/21/2011. This demonstrated the pathology as described above. 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 However there was also invasive ductal carcinoma with LVSI. This is ER/PR positive and HER-2/neu negative. Ki 67 was 30%. She subsequently underwent an MRI of her breasts on 02/26/2011 which we also reviewed at our tumor board conference this morning. It demonstrated a 2.7 x 2.4 x 1.7 cm oval enhancing mass was mildly ill-defined margins in the upper inner quadrant of the left breast. There were multiple small bilateral breast cysts. There was no evidence of adenopathy. There is no evidence of malignancy on the right side.  She is otherwise in her usual state of health. She acknowledges anxiety regarding her diagnosis.  PAST MEDICAL HISTORY:  has a past medical history of Chicken pox (as a child); Allergy; Urticaria;  History of streptococcal pharyngitis; History of mononucleosis; Anemia; Allergic state (12/04/2010); Preventative health care (12/04/2010); Anxiety (02/11/2011); and Insomnia (02/11/2011).     PAST SURGICAL HISTORY:  Past Surgical History  Procedure Date  . Mouth surgery 1997    gum surgery  . Cesarean section 1999    X 1  . Varicose vein surgery     [place] [facility]   Gynecologic history: The patient had her first menstrual period at age 24. She is still menstruating and her last period was on 02/23/2011. She has 2 children to term. Her age at first live birth was 23. She was on birth control pills from age [age]. She reports that she had problems with breast engorgement and mastitis with her first child and was miserable. She had her first mammogram at age 16 because of these issues.  FAMILY HISTORY: family history is not on file.  She is adopted.   SOCIAL HISTORY:  reports that she has never smoked. She has never used smokeless tobacco. She reports that she drinks about .6 ounces of alcohol per week. She reports that she does not use illicit drugs.   ALLERGIES: Penicillins   MEDICATIONS:  Current Outpatient Prescriptions  Medication Sig Dispense Refill  . Calcium Carbonate-Vitamin D (CALCIUM + D PO) Take 1 tablet by mouth 2 (two) times daily.        Marland Kitchen LORazepam (ATIVAN) 0.5 MG tablet       . Melatonin 3 MG TABS Take 1 tablet by mouth at bedtime as needed.      . Multiple Vitamin (MULTIVITAMIN) tablet Take 1 tablet  by mouth daily.        Marland Kitchen zolpidem (AMBIEN) 10 MG tablet Take 1 tablet (10 mg total) by mouth at bedtime as needed for sleep. Needs Ambien, did not respond to generic Zolpidem  30 tablet  1   No current facility-administered medications for this encounter.   Facility-Administered Medications Ordered in Other Encounters  Medication Dose Route Frequency Provider Last Rate Last Dose  . gadobenate dimeglumine (MULTIHANCE) injection 12 mL  12 mL Intravenous Once PRN  Medication Radiologist, MD          REVIEW OF SYSTEMS:  A 14 point review of systems as obtained and reviewed with the patient and placed in the patient's chart. It is notable for reading glasses gastric reflux anxiety heart palpitations poor appetite and a small rash on her buttocks.    PHYSICAL EXAM:  Wt Readings from Last 3 Encounters:  02/27/11 131 lb 3.2 oz (59.512 kg)  02/11/11 132 lb 12.8 oz (60.238 kg)  12/03/10 132 lb (59.875 kg)   Temp Readings from Last 3 Encounters:  02/27/11 98.4 F (36.9 C)   02/11/11 97.5 F (36.4 C) Temporal  12/03/10 98 F (36.7 C) Oral   BP Readings from Last 3 Encounters:  02/27/11 118/80  02/11/11 104/68  12/03/10 112/74   Pulse Readings from Last 3 Encounters:  02/27/11 80  02/11/11 64  12/03/10 67    General: Alert and oriented, in no acute distress HEENT: Head is normocephalic. Pupils are equally round and reactive to light. Extraocular movements are intact. Oropharynx is clear. Neck: Neck is supple, no palpable cervical or supraclavicular lymphadenopathy. Heart: Regular in rate and rhythm with no murmurs, rubs, or gallops. Chest: Clear to auscultation bilaterally, with no rhonchi, wheezes, or rales. Abdomen: Soft, nontender, nondistended, with no rigidity or guarding. Extremities: No cyanosis or edema. Lymphatics: No concerning lymphadenopathy. Skin: No concerning lesions. Musculoskeletal: symmetric strength and muscle tone throughout. Neurologic: Cranial nerves II through XII are grossly intact. No obvious focalities. Speech is fluent. Coordination is intact. Psychiatric: Judgment and insight are intact. Affect is appropriate. Breasts: Her breasts are pendulous. The left upper inner quadrant of the breast is notable for a palpable hematoma with overlying ecchymoses. Otherwise I cannot any palpable axillary adenopathy. Both breasts are diffusely lumpy consistent with the small cysts found on her MRI.     LABORATORY DATA:  Lab  Results  Component Value Date   WBC 4.2 02/27/2011   HGB 11.7 02/27/2011   HCT 34.7* 02/27/2011   MCV 95.3 02/27/2011   PLT 218 02/27/2011   CMP     Component Value Date/Time   NA 143 02/27/2011 1236   K 3.5 02/27/2011 1236   CL 104 02/27/2011 1236   CO2 30 02/27/2011 1236   GLUCOSE 74 02/27/2011 1236   BUN 21 02/27/2011 1236   CREATININE 0.79 02/27/2011 1236   CALCIUM 9.9 02/27/2011 1236   PROT 7.3 02/27/2011 1236   ALBUMIN 4.3 02/27/2011 1236   AST 19 02/27/2011 1236   ALT 14 02/27/2011 1236   ALKPHOS 44 02/27/2011 1236   BILITOT 0.3 02/27/2011 1236    PATHOLOGY: As above   RADIOLOGY: As above    IMPRESSION/PLAN: This is a very pleasant 46 year old woman. Because of her age we will refer her to genetic counseling. She has a clinical T2, N0, M0 left breast cancer. She is going to undergo neoadjuvant chemotherapy. Hopefully this will shrink down the size of her breast mass and allow her to pursue breast conservation.  I explained her that if she pursues breast conservation she would be a good candidate for adjuvant radiation to decrease her risk of local regional recurrence. I explained that radiotherapy would take place over about 6 weeks. We discussed the risks, benefits, and side effects of radiotherapy. No guarantees of treatment were given. I look forward to participating in the patient's care.

## 2011-02-27 NOTE — Progress Notes (Signed)
Mailed after appt letter to pt. 

## 2011-02-27 NOTE — Progress Notes (Signed)
Subjective:     Patient ID: Abigail [name], female   DOB: 11/15/1965, 46 y.o.   MRN: [id number]  HPI We're asked to see the patient in consultation by Dr. Jean Rosenthal to evaluate her for a left breast cancer. The patient is a 46 year old white female who recently went for a routine screening mammogram. Prior to this she was not having any breast pain or discharge from her nipple. She has otherwise been in good health. Her mammogram showed some abnormal calcifications. These were biopsied and the pathology showed an invasive ductal cancer. Her MRI estimated the size of the lesion to be 2.7 cm in the upper inner quadrant. She was ER PR positive and HER-2/neu negative. She otherwise has no complaints today.  Review of Systems  Constitutional: Negative.   HENT: Negative.   Eyes: Negative.   Respiratory: Negative.   Cardiovascular: Negative.   Gastrointestinal: Negative.   Genitourinary: Negative.   Musculoskeletal: Negative.   Skin: Negative.   Neurological: Negative.   Hematological: Negative.   Psychiatric/Behavioral: Negative.        Objective:   Physical Exam  Constitutional: She is oriented to person, place, and time. She appears well-developed and well-nourished.  HENT:  Head: Normocephalic and atraumatic.  Eyes: Conjunctivae and EOM are normal. Pupils are equal, round, and reactive to light.  Neck: Normal range of motion. Neck supple.  Cardiovascular: Normal rate, regular rhythm and normal heart sounds.   Pulmonary/Chest: Effort normal and breath sounds normal.       The patient has a palpable bruise in the upper portion of the left breast. No other palpable mass in either breast. No axillary supraclavicular or cervical lymphadenopathy  Abdominal: Soft. Bowel sounds are normal. She exhibits no mass. There is no tenderness.  Musculoskeletal: Normal range of motion.  Lymphadenopathy:    She has no cervical adenopathy.  Neurological: She is alert and oriented to person, place, and  time.  Skin: Skin is warm and dry.  Psychiatric: She has a normal mood and affect. Her behavior is normal.       Assessment:     The patient has a 2.7 cm invasive breast cancer in the upper inner left breast. At this point I think it would be difficult to do breast conservation and leave her with a fairly normal appearing breast because of the size of the lesion. I do think she would be a good candidate for neoadjuvant chemotherapy. If she has a good response then she may be able to have breast conservation versus mastectomy, whichever she would prefer. I've discussed this with her in detail and she is in agreement with this treatment plan. She will need a Port-A-Cath. I've discussed with her in detail the risks and benefits of the operation to place a port as well as some of the technical aspects and she understands and wishes to proceed    Plan:     Plan for Port-A-Cath placement and neoadjuvant chemotherapy

## 2011-02-28 NOTE — Progress Notes (Signed)
Abigail [name]  DOB: [birth year]  MR#: [id number]  CSN#: [id number]    History of present illness:   The patient is a 46 year old [place] woman who had screening mammography at [facility] 02/01/2011, showing calcifications in the left breast. She returned for diagnostic left mammography 02/21/2011, and this showed a 1.1 cm cluster of pleomorphic microcalcifications in the upper inner aspect of the left breast. Biopsy of this area was performed the same day, and showed ([id number])  An invasive ductal carcinoma, grade 2, which was estrogen receptor positive at 89%, progesterone receptor positive at 99%, with an MIB-1 of 30% and no HER-2 amplification. The patient was then set up for bilateral breast MRIs, performed 02/26/2011. There were multiple small cysts in both breasts. In the upper inner quadrant of the left breast there was a 2.7 cm enhancing mass with ill-defined margins. This contained a biopsy clip. There was a hematoma superior to this mass, measuring 2.2 cm. There were no other suspicious areas in either breast and no abnormal appearing lymph nodes.  With this information the patient's case was presented at the multidisciplinary breast cancer conference 02/27/2011, and she was seen in the [facility] clinic the same day.  Past medical history:      Past Medical History  Diagnosis Date  . Chicken pox as a child  . Allergy     seasonal- spring  . Urticaria   . History of streptococcal pharyngitis   . History of mononucleosis   . Anemia     mild  . Allergic state 12/04/2010  . Preventative health care 12/04/2010  . Anxiety 02/11/2011  . Insomnia 02/11/2011  GERD  Past surgical history:      Past Surgical History  Procedure Date  . Mouth surgery 1997    gum surgery  . Cesarean section 1999    X 1  . Varicose vein surgery     [place] [facility]    Family history:     Family History  Problem Relation Age of Onset  . Adopted: Yes   the patient has no information  regarding her biological family.  Gynecologic history: She had menarche age 22, is still having regular periods, most recently 02/23/2011. She used birth control pills for approximately 10 years, with no clotting or other complications. She is GX P2. Age at first live birth was 80.    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 Her husband [name] is in accounting. Son [name] (pronounced with a hard "g") is 15 and in the ninth grade, daughter [name] is 13-1/7 grade.     ADVANCED DIRECTIVES: Not in place  Health maintenance:       History  Substance Use Topics  . Smoking status: Never Smoker   . Smokeless tobacco: Never Used  . Alcohol Use: 0.6 oz/week    1 Glasses of wine per week     occasionally- social      Colonoscopy: Never  PAP: January 2013  Bone density: Never  Cholesterol  Review of systems:  She was entirely asymptomatic at the time of the mammography. She works nights a month and has to use Ambien to help her sleep. She recently started using reading glasses. She has had poor appetite, palpitations, and some reflux symptoms and she feels, likely correctly, that these may be related to stress associated with her adoptive father's death from pancreatic cancer 08-May-2010. In October of 2012 she had an  episode of pruritus, evaluated by an allergist, treated initially with a Medrol Dosepak with little effect, then with "a bucket full of medications," which resolved the issue within 48 hours. This has not recurred. A detailed review of systems was otherwise noncontributory.  Allergies:     Allergies  Allergen Reactions  . Penicillins     Medications:      Current Outpatient Prescriptions  Medication Sig Dispense Refill  . Calcium Carbonate-Vitamin D (CALCIUM + D PO) Take 1 tablet by mouth 2 (two) times daily.        Marland Kitchen LORazepam (ATIVAN) 0.5 MG tablet       . Melatonin 3 MG TABS Take 1 tablet by mouth at bedtime as needed.      .  Multiple Vitamin (MULTIVITAMIN) tablet Take 1 tablet by mouth daily.        Marland Kitchen zolpidem (AMBIEN) 10 MG tablet Take 1 tablet (10 mg total) by mouth at bedtime as needed for sleep. Needs Ambien, did not respond to generic Zolpidem  30 tablet  1    Physical exam:  Young appearing middle aged white woman who appears anxious    Filed Vitals:   02/27/11 1246  BP: 118/80  Pulse: 80  Temp: 98.4 F (36.9 C)     Body mass index is 21.18 kg/(m^2).  ECOG PS: 0  Sclerae unicteric Oropharynx clear No peripheral adenopathy Lungs no rales or rhonchi Heart regular rate and rhythm Abd benign MSK no focal spinal tenderness, no peripheral edema Neuro: nonfocal Breasts: The right breast is unremarkable. The left breast is status post recent biopsy, with a moderate ecchymosis present. There is no nipple retraction or evidence of skin involvement. I do not palpate a mass in the breast or in the left axilla.   Lab results:      CBC  Lab 02/27/11 1236  WBC 4.2  HGB 11.7  HCT 34.7*  PLT 218  MCV 95.3  MCH 32.1  MCHC 33.7  RDW 12.3  LYMPHSABS 1.3  MONOABS 0.3  EOSABS 0.1  BASOSABS 0.0  BANDABS --    Chemistries   Lab 02/27/11 1236  NA 143  K 3.5  CL 104  CO2 30  GLUCOSE 74  BUN 21  CREATININE 0.79  CALCIUM 9.9  MG --    GFR Estimated Creatinine Clearance: 83.1 ml/min (by C-G formula based on Cr of 0.79).  Coagulation profile No results found for this basename: INR:5,PROTIME:5 in the last 168 hours  Urine Studies No results found for this basename: UACOL:2,UAPR:2,USPG:2,UPH:2,UTP:2,UGL:2,UKET:2,UBIL:2,UHGB:2,UNIT:2,UROB:2,ULEU:2,UEPI:2,UWBC:2,URBC:2,UBAC:2,CAST:2,CRYS:2,UCOM:2,BILUA:2 in the last 72 hours  Studies:    02/26/2011  BILATERAL BREAST MRI WITH AND WITHOUT CONTRAST  Technique: Multiplanar, multisequence MR images of both breasts were obtained prior to and following the intravenous administration of 12ml of MultiHance.  Three dimensional images were evaluated at the  independent DynaCad workstation.  Comparison:  Recent mammogram and biopsy examinations.  Findings: Mild to moderate background nodular parenchymal enhancement in both breasts.  Multiple small cysts in both breasts.  2.7 x 2.4 x 1.7 cm oval enhancing mass with mildly ill-defined margins deep in the upper inner quadrant of the left breast.  This contains a biopsy marker clip artifact superiorly.  The mass has a mixture of plateau and persistent enhancement kinetics.  Superior to this mass is a post biopsy hematoma measuring 2.2 x 1.3 x 1.1 cm.  No additional masses or areas of enhancement suspicious for malignancy in either breast.  No abnormal appearing lymph nodes.  IMPRESSION:  1.  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 2.  2.2 cm postbiopsy hematoma superior to the malignancy in the upper inner left breast. 3.  Multiple small bilateral breast cysts. 4.  No evidence of malignancy on the right and no adenopathy.  THREE-DIMENSIONAL MR IMAGE RENDERING ON INDEPENDENT WORKSTATION:  Three-dimensional MR images were rendered by post-processing of the original MR data on an independent workstation.  The three- dimensional MR images were interpreted, and findings were reported in the accompanying complete MRI report for this study.  BI-RADS CATEGORY 6:  Known biopsy-proven malignancy - appropriate action should be taken.  Recommendation:  Treatment plan.  Original Report Authenticated By: Darrol Angel, M.D.   Assessment: 46 year old [place] woman status post left breast biopsy 10 or 24th 2013, for a clinical T2 N0, stage IIA invasive ductal carcinoma, grade 2, strongly estrogen and progesterone receptor positive, HER-2 not amplified, with an MIB-1 of 30%.        Plan:  NCCN guidelines suggest an Oncotype study in patients like this, or, if that is not obtained, endocrine therapy with consideration of chemotherapy.   Given the patient's young age, and the grade and proliferation fraction of her tumor, I think chemotherapy is reasonable. For reasons of cosmesis, and her desire to keep her breast, we will do this neo-adjuvantly.  The plan will be for 4 cycles of cyclophosphamide and docetaxel, starting mid February, after the patient has a port placed. She will also come to "chemotherapy school". A second advantage of treating her neo-adjuvantly is that it will allow her to get the results of her genetics testing before she makes a definitive decision regarding her breast surgery. She has been scheduled for genetics counseling February 4, and will start her chemotherapy February 21.  At the completion of chemotherapy, the patient will have her definitive surgery, and at that point a decision will be made whether or not she will benefit from radiation treatment. In any case she will eventually start endocrine therapy, which will continue for 5-10 years.   MAGRINAT,GUSTAV C 02/28/2011

## 2011-03-01 ENCOUNTER — Other Ambulatory Visit: Payer: Self-pay | Admitting: Oncology

## 2011-03-04 ENCOUNTER — Ambulatory Visit: Payer: BC Managed Care – PPO

## 2011-03-04 ENCOUNTER — Other Ambulatory Visit: Payer: Self-pay

## 2011-03-04 MED ORDER — DIAZEPAM 5 MG PO TABS: ORAL_TABLET | ORAL | Status: DC

## 2011-03-04 NOTE — Progress Notes (Signed)
Patient seen for genetic counseling. Blood drawn for BRCA1/2 at [facility]. TAT 2 weeks. Blood also drawn and sent for [facility] CaNext panel - [facility] will hold sample until we have a BRCA result.  ° °

## 2011-03-04 NOTE — Telephone Encounter (Signed)
Pt would like to have Valium low dose because Alprazolam is making patient very sleepy and non functional. Per MD okay to print VAlium 5 mg.

## 2011-03-07 ENCOUNTER — Ambulatory Visit: Payer: BC Managed Care – PPO | Admitting: Oncology

## 2011-03-11 ENCOUNTER — Encounter: Payer: Self-pay | Admitting: *Deleted

## 2011-03-11 ENCOUNTER — Other Ambulatory Visit: Payer: Self-pay | Admitting: Oncology

## 2011-03-11 ENCOUNTER — Telehealth: Payer: Self-pay | Admitting: *Deleted

## 2011-03-11 NOTE — Telephone Encounter (Signed)
Left VM for pt to return call concerning [facility] appt.

## 2011-03-11 NOTE — Telephone Encounter (Signed)
patient confirmed over the phone the new date and time to see amy berry

## 2011-03-12 ENCOUNTER — Ambulatory Visit: Payer: BC Managed Care – PPO | Admitting: Physician Assistant

## 2011-03-15 ENCOUNTER — Encounter (HOSPITAL_BASED_OUTPATIENT_CLINIC_OR_DEPARTMENT_OTHER): Payer: Self-pay | Admitting: *Deleted

## 2011-03-18 ENCOUNTER — Other Ambulatory Visit: Payer: Self-pay | Admitting: Oncology

## 2011-03-19 ENCOUNTER — Telehealth: Payer: Self-pay | Admitting: *Deleted

## 2011-03-19 ENCOUNTER — Ambulatory Visit (HOSPITAL_BASED_OUTPATIENT_CLINIC_OR_DEPARTMENT_OTHER): Payer: BC Managed Care – PPO | Admitting: Physician Assistant

## 2011-03-19 ENCOUNTER — Telehealth: Payer: Self-pay | Admitting: Genetic Counselor

## 2011-03-19 ENCOUNTER — Encounter: Payer: Self-pay | Admitting: Physician Assistant

## 2011-03-19 ENCOUNTER — Other Ambulatory Visit: Payer: BC Managed Care – PPO

## 2011-03-19 ENCOUNTER — Encounter: Payer: Self-pay | Admitting: *Deleted

## 2011-03-19 VITALS — BP 112/71 | HR 64 | Temp 98.7°F | Ht 66.0 in | Wt 132.5 lb

## 2011-03-19 DIAGNOSIS — C50219 Malignant neoplasm of upper-inner quadrant of unspecified female breast: Secondary | ICD-10-CM

## 2011-03-19 MED ORDER — LIDOCAINE-PRILOCAINE 2.5-2.5 % EX CREA: TOPICAL_CREAM | CUTANEOUS | Status: DC

## 2011-03-19 MED ORDER — TOBRAMYCIN-DEXAMETHASONE 0.3-0.1 % OP SUSP: 1.0000 [drp] | Freq: Two times a day (BID) | OPHTHALMIC | Status: AC

## 2011-03-19 MED ORDER — PROCHLORPERAZINE MALEATE 10 MG PO TABS: 10.0000 mg | ORAL_TABLET | Freq: Four times a day (QID) | ORAL | Status: DC | PRN

## 2011-03-19 MED ORDER — ONDANSETRON HCL 8 MG PO TABS: ORAL_TABLET | ORAL | Status: DC

## 2011-03-19 MED ORDER — DEXAMETHASONE 4 MG PO TABS: ORAL_TABLET | ORAL | Status: DC

## 2011-03-19 MED ORDER — LORAZEPAM 0.5 MG PO TABS: 0.5000 mg | ORAL_TABLET | Freq: Four times a day (QID) | ORAL | Status: DC | PRN

## 2011-03-19 NOTE — Progress Notes (Signed)
Abigail [name]  DOB: [birth year]  MR#: [id number]  CSN#: [id number]    History of present illness:   The patient is a 46 year old [place] woman who had screening mammography at [facility] 02/01/2011, showing calcifications in the left breast. She returned for diagnostic left mammography 02/21/2011, and this showed a 1.1 cm cluster of pleomorphic microcalcifications in the upper inner aspect of the left breast. Biopsy of this area was performed the same day, and showed ([id number])  An invasive ductal carcinoma, grade 2, which was estrogen receptor positive at 89%, progesterone receptor positive at 99%, with an MIB-1 of 30% and no HER-2 amplification. The patient was then set up for bilateral breast MRIs, performed 02/26/2011. There were multiple small cysts in both breasts. In the upper inner quadrant of the left breast there was a 2.7 cm enhancing mass with ill-defined margins. This contained a biopsy clip. There was a hematoma superior to this mass, measuring 2.2 cm. There were no other suspicious areas in either breast and no abnormal appearing lymph nodes.  With this information the patient's case was presented at the multidisciplinary breast cancer conference 02/27/2011, and she was seen in the [facility] clinic the same day.  The patient is being treated in the neoadjuvant setting, the plan being to complete 4 q. three-week doses of docetaxel/cyclophosphamide, with Neulasta given on day 2 for granulocyte support. After completing neoadjuvant chemotherapy the patient will undergo definitive surgery, possibly followed by radiation therapy. She will also be on antiestrogen therapy, the plan being to continue for 5-10 years.   Interval History: Abigail [name] returns today for followup of her locally advanced left breast carcinoma. She is due to initiate her neoadjuvant chemotherapy on Thursday, February 21, with her first dose of docetaxel/cyclophosphamide. Over half of our one-hour appointment today was  spent coordinating care, discussing her treatment plan, and going over her antinausea medications.  She scheduled to have her port placed tomorrow, then return the following day for chemotherapy. She is also scheduled to attend chemotherapy school later today.  Physically Abigail [name] has no new complaints. She still a little anxious, but feels that this is improving. She's had no recent illnesses or fevers. At baseline she denies any signs of peripheral neuropathy. She's had no recent skin changes or nail bed changes. No abnormal bleeding. No tearing and no mouth ulcers. Her energy level is good.  A detailed review of systems is otherwise noncontributory as noted below.  Review of Systems: Constitutional:  no weight loss, fever, night sweats and feels well Eyes: negative NWG:[id number] Cardiovascular: positive for - palpitations negative for - chest pain, dyspnea on exertion or edema Respiratory: no cough, shortness of breath, or wheezing Neurological: negative Dermatological: negative Gastrointestinal: no abdominal pain, nausea, change in bowel habits, or black or bloody stools Genito-Urinary: no dysuria, trouble voiding, or hematuria Hematological and Lymphatic: negative Breast: positive for - abnormality in left breast Musculoskeletal: negative Remaining ROS negative.    Past medical history:      Past Medical History  Diagnosis Date  . Chicken pox as a child  . Allergy     seasonal- spring  . Urticaria   . History of streptococcal pharyngitis   . History of mononucleosis   . Anemia     mild  . Allergic state 12/04/2010  . Preventative health care 12/04/2010  . Anxiety 02/11/2011  . Insomnia 02/11/2011  . PONV (postoperative nausea and vomiting)   . Cancer     Lt breast CA  GERD  Past  surgical history:      Past Surgical History  Procedure Date  . Mouth surgery 1997    gum surgery  . Cesarean section 1999    X 1  . Varicose vein surgery     [place] vascular and vein      Family history:     Family History  Problem Relation Age of Onset  . Adopted: Yes   the patient has no information regarding her biological family.  Gynecologic history: She had menarche age 63, is still having regular periods, most recently 02/23/2011. She used birth control pills for approximately 10 years, with no clotting or other complications. She is GX P2. Age at first live birth was 53.    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 Her husband Abigail [name] is in accounting. Son Abigail [name] (pronounced with a hard "g") is 15 and in the ninth grade, daughter Abigail [name] is 13-1/7 grade.     ADVANCED DIRECTIVES: Not in place  Health maintenance:       History  Substance Use Topics  . Smoking status: Never Smoker   . Smokeless tobacco: Never Used  . Alcohol Use: 0.6 oz/week    1 Glasses of wine per week     occasionally- social      Colonoscopy: Never  PAP: January 2013  Bone density: Never  Cholesterol  Allergies:     Allergies  Allergen Reactions  . Penicillins Rash    Medications:      Current Outpatient Prescriptions  Medication Sig Dispense Refill  . Calcium Carbonate-Vitamin D (CALCIUM + D PO) Take 1 tablet by mouth 2 (two) times daily.        Marland Kitchen dexamethasone (DECADRON) 4 MG tablet Take 2 tablets two times a day the day before Taxotere. Then take 2 tabs two times a day starting the day after chemo for 3 days.  30 tablet  1  . diazepam (VALIUM) 5 MG tablet Take 1/2 tablet to 2 tablets daily as needed  20 tablet  0  . LORazepam (ATIVAN) 0.5 MG tablet       . LORazepam (ATIVAN) 0.5 MG tablet Take 1 tablet (0.5 mg total) by mouth every 6 (six) hours as needed (Nausea or vomiting).  30 tablet  0  . Melatonin 3 MG TABS Take 1 tablet by mouth at bedtime as needed.      . Multiple Vitamin (MULTIVITAMIN) tablet Take 1 tablet by mouth daily.        . ondansetron (ZOFRAN) 8 MG tablet Take 1 tablet two times a day starting the day after  chemo for 3 days. Then take 1 tab two times a day as needed for nausea or vomiting.  30 tablet  1  . prochlorperazine (COMPAZINE) 10 MG tablet Take 1 tablet (10 mg total) by mouth every 6 (six) hours as needed (Nausea or vomiting).  30 tablet  1  . tobramycin-dexamethasone (TOBRADEX) ophthalmic solution Place 1 drop into both eyes 2 (two) times daily.  5 mL  0  . zolpidem (AMBIEN) 10 MG tablet Take 5 mg by mouth at bedtime as needed. Takes 1/2 of 10 mg tablet as needed related to shift work.        Physical exam:  Young appearing middle aged white woman who appears anxious    Filed Vitals:   03/19/11 1153  BP: 112/71  Pulse: 64  Temp: 98.7 F (37.1 C)     Body mass index  is 21.39 kg/(m^2).  ECOG PS: 0  HEENT:  Sclerae anicteric, conjunctivae pink.  Oropharynx clear.  No mucositis or candidiasis.   Nodes:  No cervical, supraclavicular, or axillary lymphadenopathy palpated.  Breast Exam:  Right breast is benign with no masses, skin changes, or nipple inversion. Breast tissue is dense. Left breast is status post lumpectomy in the upper inner quadrant, with some edema and nodularity still palpable. Mild ecchymosis over the biopsy site, resolving. No additional skin changes. No nipple inversion. Lungs:  Clear to auscultation bilaterally.  No crackles, rhonchi, or wheezes.   Heart:  Regular rate and rhythm.   Abdomen:  Soft, nontender.  Positive bowel sounds.  No organomegaly or masses palpated.   Musculoskeletal:  No focal spinal tenderness to palpation.  Extremities:  Benign.  No peripheral edema or cyanosis.   Skin:  Benign.   Neuro:  Nonfocal.    Lab results:     No labs were obtained today.  A CBC drawn on 02/27/2011 was unremarkable with a white count of 4.2, ANC of 2.5, hemoglobin 11.7, and platelets of 218,000.  A metabolic panel drawn 02/27/2011 was entirely normal. A CA27.29 was also normal at 23 on 02/27/2011.    Studies:    02/26/2011  BILATERAL BREAST MRI WITH AND WITHOUT  CONTRAST  Technique: Multiplanar, multisequence MR images of both breasts were obtained prior to and following the intravenous administration of 12ml of MultiHance.  Three dimensional images were evaluated at the independent DynaCad workstation.  Comparison:  Recent mammogram and biopsy examinations.  Findings: Mild to moderate background nodular parenchymal enhancement in both breasts.  Multiple small cysts in both breasts.  2.7 x 2.4 x 1.7 cm oval enhancing mass with mildly ill-defined margins deep in the upper inner quadrant of the left breast.  This contains a biopsy marker clip artifact superiorly.  The mass has a mixture of plateau and persistent enhancement kinetics.  Superior to this mass is a post biopsy hematoma measuring 2.2 x 1.3 x 1.1 cm.  No additional masses or areas of enhancement suspicious for malignancy in either breast.  No abnormal appearing lymph nodes.  IMPRESSION:  1.  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 2.  2.2 cm postbiopsy hematoma superior to the malignancy in the upper inner left breast. 3.  Multiple small bilateral breast cysts. 4.  No evidence of malignancy on the right and no adenopathy.  THREE-DIMENSIONAL MR IMAGE RENDERING ON INDEPENDENT WORKSTATION:  Three-dimensional MR images were rendered by post-processing of the original MR data on an independent workstation.  The three- dimensional MR images were interpreted, and findings were reported in the accompanying complete MRI report for this study.  BI-RADS CATEGORY 6:  Known biopsy-proven malignancy - appropriate action should be taken.  Recommendation:  Treatment plan.  Original Report Authenticated By: Darrol Angel, M.D.   Assessment: 46 year old [place] woman  (1)   status post left breast biopsy 10 or 24th 2013, for a clinical T2 N0, stage IIA invasive ductal carcinoma, grade 2, strongly estrogen and progesterone  receptor positive, HER-2 not amplified, with an MIB-1 of 30%.      (2)  being treated in the neoadjuvant setting, the plan being to complete 4 doses of docetaxel/ifosfamide, given every 3 weeks.    Plan:  Lashawnna will proceed to chemotherapy class today. She will have her port placed tomorrow and knows to begin  her oral dexamethasone tomorrow as well. She will return on Thursday, February 21, for day 1, cycle 1 of her neoadjuvant chemotherapy. She'll receive her Neulasta injection on day 2. She has prescriptions as well as written instructions on how to utilize all of her antinausea medications appropriately, which will include dexamethasone, ondansetron, prochlorperazine, and lorazepam. She will also be using TobraDex eyedrops for one week following each dose of chemotherapy.  I will see her next week on February 26 for assessment chemotoxicity.   At the completion of chemotherapy, the patient will have her definitive surgery, and at that point a decision will be made whether or not she will benefit from radiation treatment. In any case she will eventually start endocrine therapy, which will continue for 5-10 years.  This plan has been reviewed with the patient who voices her understanding and agreement with our plan. She will call with any changes or problems.   Cory Rama 03/19/2011

## 2011-03-19 NOTE — Telephone Encounter (Signed)
showed patient to do chemo class room gave patient appointment 03-2011 thru 04-2011 printed out calendar and gave to the patient

## 2011-03-20 ENCOUNTER — Ambulatory Visit (HOSPITAL_BASED_OUTPATIENT_CLINIC_OR_DEPARTMENT_OTHER): Payer: BC Managed Care – PPO | Admitting: Anesthesiology

## 2011-03-20 ENCOUNTER — Ambulatory Visit (HOSPITAL_COMMUNITY): Payer: BC Managed Care – PPO

## 2011-03-20 ENCOUNTER — Ambulatory Visit: Payer: BC Managed Care – PPO | Admitting: Physician Assistant

## 2011-03-20 ENCOUNTER — Encounter (HOSPITAL_BASED_OUTPATIENT_CLINIC_OR_DEPARTMENT_OTHER): Payer: Self-pay | Admitting: Anesthesiology

## 2011-03-20 ENCOUNTER — Encounter (HOSPITAL_BASED_OUTPATIENT_CLINIC_OR_DEPARTMENT_OTHER): Payer: Self-pay | Admitting: *Deleted

## 2011-03-20 ENCOUNTER — Ambulatory Visit (HOSPITAL_BASED_OUTPATIENT_CLINIC_OR_DEPARTMENT_OTHER)
Admission: RE | Admit: 2011-03-20 | Discharge: 2011-03-20 | Disposition: A | Discharge status: Home Health | Payer: BC Managed Care – PPO | Source: Ambulatory Visit | Attending: General Surgery | Admitting: General Surgery

## 2011-03-20 ENCOUNTER — Encounter (HOSPITAL_BASED_OUTPATIENT_CLINIC_OR_DEPARTMENT_OTHER)
Admission: RE | Disposition: A | Discharge status: Home Health | Payer: Self-pay | Source: Ambulatory Visit | Attending: General Surgery

## 2011-03-20 DIAGNOSIS — C50219 Malignant neoplasm of upper-inner quadrant of unspecified female breast: Secondary | ICD-10-CM

## 2011-03-20 DIAGNOSIS — C50919 Malignant neoplasm of unspecified site of unspecified female breast: Secondary | ICD-10-CM

## 2011-03-20 HISTORY — PX: PORTACATH PLACEMENT: SHX2246

## 2011-03-20 HISTORY — DX: Malignant (primary) neoplasm, unspecified: C80.1

## 2011-03-20 SURGERY — INSERTION, TUNNELED CENTRAL VENOUS DEVICE, WITH PORT
Anesthesia: General | Site: Chest | Laterality: Right | Wound class: Clean

## 2011-03-20 MED ORDER — MIDAZOLAM HCL 5 MG/5ML IJ SOLN
INTRAMUSCULAR | Status: DC | PRN
  Administered 2011-03-20: 2 mg via INTRAVENOUS

## 2011-03-20 MED ORDER — ONDANSETRON HCL 4 MG/2ML IJ SOLN
INTRAMUSCULAR | Status: DC | PRN
  Administered 2011-03-20: 4 mg via INTRAVENOUS

## 2011-03-20 MED ORDER — PROPOFOL 10 MG/ML IV EMUL
INTRAVENOUS | Status: DC | PRN
  Administered 2011-03-20: 200 mg via INTRAVENOUS

## 2011-03-20 MED ORDER — CHLORHEXIDINE GLUCONATE 4 % EX LIQD: 1.0000 "application " | Freq: Once | CUTANEOUS | Status: DC

## 2011-03-20 MED ORDER — VANCOMYCIN HCL IN DEXTROSE 1-5 GM/200ML-% IV SOLN
1000.0000 mg | INTRAVENOUS | Status: AC
  Administered 2011-03-20: 1000 mg via INTRAVENOUS

## 2011-03-20 MED ORDER — HYDROCODONE-ACETAMINOPHEN 5-325 MG PO TABS
2.0000 | ORAL_TABLET | Freq: Four times a day (QID) | ORAL | Status: DC | PRN
  Administered 2011-03-20: 2 via ORAL

## 2011-03-20 MED ORDER — FENTANYL CITRATE 0.05 MG/ML IJ SOLN
INTRAMUSCULAR | Status: DC | PRN
  Administered 2011-03-20: 50 ug via INTRAVENOUS

## 2011-03-20 MED ORDER — METOCLOPRAMIDE HCL 5 MG/ML IJ SOLN: 10.0000 mg | Freq: Once | INTRAMUSCULAR | Status: DC | PRN

## 2011-03-20 MED ORDER — LACTATED RINGERS IV SOLN
INTRAVENOUS | Status: DC
  Administered 2011-03-20 (×2): via INTRAVENOUS

## 2011-03-20 MED ORDER — LIDOCAINE HCL (CARDIAC) 20 MG/ML IV SOLN
INTRAVENOUS | Status: DC | PRN
  Administered 2011-03-20: 50 mg via INTRAVENOUS

## 2011-03-20 MED ORDER — MIDAZOLAM HCL 2 MG/2ML IJ SOLN: 0.5000 mg | INTRAMUSCULAR | Status: DC | PRN

## 2011-03-20 MED ORDER — MORPHINE SULFATE 2 MG/ML IJ SOLN: 0.0500 mg/kg | INTRAMUSCULAR | Status: DC | PRN

## 2011-03-20 MED ORDER — FENTANYL CITRATE 0.05 MG/ML IJ SOLN: 50.0000 ug | INTRAMUSCULAR | Status: DC | PRN

## 2011-03-20 MED ORDER — DEXAMETHASONE SODIUM PHOSPHATE 4 MG/ML IJ SOLN
INTRAMUSCULAR | Status: DC | PRN
  Administered 2011-03-20: 10 mg via INTRAVENOUS

## 2011-03-20 MED ORDER — HYDROCODONE-ACETAMINOPHEN 5-325 MG PO TABS: 1.0000 | ORAL_TABLET | ORAL | Status: AC | PRN

## 2011-03-20 MED ORDER — DROPERIDOL 2.5 MG/ML IJ SOLN
INTRAMUSCULAR | Status: DC | PRN
  Administered 2011-03-20: 0.625 mg via INTRAVENOUS

## 2011-03-20 MED ORDER — BUPIVACAINE-EPINEPHRINE 0.25% -1:200000 IJ SOLN
INTRAMUSCULAR | Status: DC | PRN
  Administered 2011-03-20: 10 mL

## 2011-03-20 MED ORDER — FENTANYL CITRATE 0.05 MG/ML IJ SOLN: 25.0000 ug | INTRAMUSCULAR | Status: DC | PRN

## 2011-03-20 MED ORDER — EPHEDRINE SULFATE 50 MG/ML IJ SOLN
INTRAMUSCULAR | Status: DC | PRN
  Administered 2011-03-20 (×2): 10 mg via INTRAVENOUS

## 2011-03-20 SURGICAL SUPPLY — 57 items
ADH SKN CLS APL DERMABOND .7 (GAUZE/BANDAGES/DRESSINGS) ×2
BAG DECANTER FOR FLEXI CONT (MISCELLANEOUS) ×2 IMPLANT
BLADE SURG 15 STRL LF DISP TIS (BLADE) ×1 IMPLANT
BLADE SURG 15 STRL SS (BLADE) ×2
CANISTER SUCTION 1200CC (MISCELLANEOUS) IMPLANT
CHLORAPREP W/TINT 26ML (MISCELLANEOUS) ×2 IMPLANT
CLEANER CAUTERY TIP 5X5 PAD (MISCELLANEOUS) ×1 IMPLANT
CLOTH BEACON ORANGE TIMEOUT ST (SAFETY) ×2 IMPLANT
COVER MAYO STAND STRL (DRAPES) ×2 IMPLANT
COVER TABLE BACK 60X90 (DRAPES) ×2 IMPLANT
DECANTER SPIKE VIAL GLASS SM (MISCELLANEOUS) IMPLANT
DERMABOND ADVANCED (GAUZE/BANDAGES/DRESSINGS) ×2
DERMABOND ADVANCED .7 DNX12 (GAUZE/BANDAGES/DRESSINGS) ×1 IMPLANT
DRAPE C-ARM 42X72 X-RAY (DRAPES) ×2 IMPLANT
DRAPE LAPAROSCOPIC ABDOMINAL (DRAPES) ×2 IMPLANT
DRAPE UTILITY XL STRL (DRAPES) ×2 IMPLANT
DRSG OPSITE 4X5.5 SM (GAUZE/BANDAGES/DRESSINGS) ×1 IMPLANT
ELECT REM PT RETURN 9FT ADLT (ELECTROSURGICAL) ×2
ELECTRODE REM PT RTRN 9FT ADLT (ELECTROSURGICAL) ×1 IMPLANT
GLOVE BIO SURGEON STRL SZ7.5 (GLOVE) ×2 IMPLANT
GLOVE ECLIPSE 6.5 STRL STRAW (GLOVE) ×2 IMPLANT
GOWN PREVENTION PLUS XLARGE (GOWN DISPOSABLE) ×2 IMPLANT
IV HEPARIN 1000UNITS/500ML (IV SOLUTION) ×2 IMPLANT
IV KIT MINILOC 20X1 SAFETY (NEEDLE) IMPLANT
KIT BARDPORT ISP (Port) IMPLANT
KIT POWER CATH 8FR (Catheter) ×2 IMPLANT
NDL BLD COLL 21X1.25 ECL (NEEDLE) IMPLANT
NDL HYPO 25X1 1.5 SAFETY (NEEDLE) ×1 IMPLANT
NDL SAFETY ECLIPSE 18X1.5 (NEEDLE) IMPLANT
NDL SPNL 22GX3.5 QUINCKE BK (NEEDLE) IMPLANT
NDL STIMUQUICK 22GX50MM (NEEDLE) IMPLANT
NEEDLE BLD COLL 21X1.25 ECL (NEEDLE) ×2 IMPLANT
NEEDLE HYPO 18GX1.5 SHARP (NEEDLE)
NEEDLE HYPO 22GX1.5 SAFETY (NEEDLE) IMPLANT
NEEDLE HYPO 25X1 1.5 SAFETY (NEEDLE) ×2 IMPLANT
NEEDLE SPNL 22GX3.5 QUINCKE BK (NEEDLE) IMPLANT
NEEDLE STIMUQUICK 22GX50MM (NEEDLE) ×4 IMPLANT
PACK BASIN DAY SURGERY FS (CUSTOM PROCEDURE TRAY) ×2 IMPLANT
PAD CLEANER CAUTERY TIP 5X5 (MISCELLANEOUS) ×1
PENCIL BUTTON HOLSTER BLD 10FT (ELECTRODE) ×2 IMPLANT
SLEEVE SCD COMPRESS KNEE MED (MISCELLANEOUS) IMPLANT
SPONGE GAUZE 4X4 12PLY (GAUZE/BANDAGES/DRESSINGS) ×1 IMPLANT
STRIP CLOSURE SKIN 1/2X4 (GAUZE/BANDAGES/DRESSINGS) ×1 IMPLANT
SUT MON AB 4-0 PC3 18 (SUTURE) ×2 IMPLANT
SUT PROLENE 2 0 SH DA (SUTURE) ×2 IMPLANT
SUT SILK 2 0 TIES 17X18 (SUTURE)
SUT SILK 2-0 18XBRD TIE BLK (SUTURE) IMPLANT
SUT VIC AB 3-0 SH 27 (SUTURE) ×2
SUT VIC AB 3-0 SH 27X BRD (SUTURE) ×1 IMPLANT
SYR 5ML LL (SYRINGE) ×2 IMPLANT
SYR CONTROL 10ML LL (SYRINGE) ×2 IMPLANT
SYRINGE 10CC LL (SYRINGE) ×1 IMPLANT
TOWEL OR 17X24 6PK STRL BLUE (TOWEL DISPOSABLE) ×4 IMPLANT
TOWEL OR NON WOVEN STRL DISP B (DISPOSABLE) ×2 IMPLANT
TUBE CONNECTING 20X1/4 (TUBING) IMPLANT
WATER STERILE IRR 1000ML POUR (IV SOLUTION) ×1 IMPLANT
YANKAUER SUCT BULB TIP NO VENT (SUCTIONS) IMPLANT

## 2011-03-20 NOTE — Transfer of Care (Signed)
Immediate Anesthesia Transfer of Care Note  Patient: Abigail [name]  Procedure(s) Performed: Procedure(s) (LRB): INSERTION PORT-A-CATH (Right)  Patient Location: PACU  Anesthesia Type: General  Level of Consciousness: awake, alert  and oriented  Airway & Oxygen Therapy: Patient Spontanous Breathing and Patient connected to face mask oxygen  Post-op Assessment: Report given to PACU RN and Post -op Vital signs reviewed and stable  Post vital signs: Reviewed and stable  Complications: No apparent anesthesia complications

## 2011-03-20 NOTE — Op Note (Signed)
03/20/2011  2:53 PM  PATIENT:  Abigail [name]  46 y.o. female  PRE-OPERATIVE DIAGNOSIS:  left breast cancer  POST-OPERATIVE DIAGNOSIS:  Left Breast Cancer  PROCEDURE:  Procedure(s) (LRB): INSERTION PORT-A-CATH (Right)  SURGEON:  Surgeon(s) and Role:    * Robyne Askew, MD - Primary  PHYSICIAN ASSISTANT:   ASSISTANTS: none   ANESTHESIA:   general  EBL:  Total I/O In: 1000 [I.V.:1000] Out: -   BLOOD ADMINISTERED:none  DRAINS: none   LOCAL MEDICATIONS USED:  MARCAINE     SPECIMEN:  No Specimen  DISPOSITION OF SPECIMEN:  N/A  COUNTS:  YES  TOURNIQUET:  * No tourniquets in log *  DICTATION: .Dragon Dictation After informed consent was obtained the patient was brought to the operating room and placed in the supine position on the operating room table. After adequate induction of general anesthesia a roll was placed between the patient's shoulders to extend the shoulder slightly. Her right chest and neck area were then prepped with ChloraPrep, allowed to dry, and draped in usual sterile manner. The area lateral to the bend of the clavicle and the right chest was infiltrated with quarter percent Marcaine. A small stab incision was made lateral to the bend of the clavicle with a 15 blade knife. The patient was placed in Trendelenburg position. Using the large bore finder needle on the port kit we were able to slide beneath the bend of the clavicle heading towards the sternal notch. Unfortunately we were only able to access the subclavian artery. The needle was removed and pressure was held for about 5 minutes until it appeared that the area was completely hemostatic. We tried a second time and this time it appeared as though we were in the subclavian vein with dark nonpulsatile blood. We were able to feed the wire without difficulty but under real time fluoroscopy the wire did not appear to be in the venous system but more in the arterial system. We removed the wire medially and  again held pressure for several minutes until it appeared as the area was hemostatic. We were then able to move up to the neck and accessed the right internal jugular vein using the large bore finder needle without difficulty. The wire was fed through the needle using the Seldinger technique without difficulty. The wire was confirmed in the central venous system using real time fluoroscopy. Next the incision on the right chest wall was extended a little bit. A subcutaneous pocket was made inferior to the incision using a combination of blunt finger dissection and some sharp dissection with the electrocautery. A subcutaneous tract was created between the incision on the chest wall and the wire entry site in the right neck using blunt dissection with the hemostat. The tubing was then brought through this tract. The tubing was placed on the reservoir the reservoir was placed in the subcutaneous pocket. The length of the tubing was estimated using real time fluoroscopy. The tubing was cut to length. Next a sheath and dilator were placed over the wire also using the Seldinger technique without difficulty. The wire and dilator were removed. The tubing was fed through the sheath as far as it can be fed and then held in place while the sheath was gently cracked and separated. Once this was accomplished another real time fluoroscopy image showed the tip of the catheter to be in the distal superior vena cava. The anchor was then used to permanently attach the tubing to the reservoir. The  reservoir was anchored in the subcutaneous pocket using 2 interrupted 2-0 Prolene stitches. The reservoir was then accessed and we were able to easily aspirate blood through it. The reservoir was then flushed initially with a dilute heparin solution there was a more concentrated heparin solution. The incision on the right neck was closed with a single interrupted 4-0 Monocryl subcuticular stitch. The incision on the right chest wall was  closed with a deep layer of interrupted 3-0 Vicryl stitches. The skin incision was then closed with a running 4-0 Monocryl subcuticular stitch. Dermabond dressing was applied. Once the Dermabond was dry the port was accessed and a sterile dressing was applied the patient tolerated the procedure well. At the end of the case the needle sponge and instrument counts are correct. The patient was awakened and taken to recovery in stable condition.  PLAN OF CARE: Discharge to home after PACU  PATIENT DISPOSITION:  PACU - hemodynamically stable.   Delay start of Pharmacological VTE agent (>24hrs) due to surgical blood loss or risk of bleeding: not applicable

## 2011-03-20 NOTE — Anesthesia Preprocedure Evaluation (Signed)
Anesthesia Evaluation  Patient identified by MRN, date of birth, ID band Patient awake    Reviewed: Allergy & Precautions, H&P , NPO status , Patient's Chart, lab work & pertinent test results, reviewed documented beta blocker date and time   History of Anesthesia Complications (+) PONV  Airway Mallampati: II TM Distance: >3 FB Neck ROM: full    Dental   Pulmonary neg pulmonary ROS,          Cardiovascular neg cardio ROS     Neuro/Psych PSYCHIATRIC DISORDERS Negative Neurological ROS     GI/Hepatic negative GI ROS, Neg liver ROS,   Endo/Other  Negative Endocrine ROS  Renal/GU negative Renal ROS  Genitourinary negative   Musculoskeletal   Abdominal   Peds  Hematology negative hematology ROS (+)   Anesthesia Other Findings See surgeon's H&P   Reproductive/Obstetrics negative OB ROS                           Anesthesia Physical Anesthesia Plan  ASA: II  Anesthesia Plan: General   Post-op Pain Management:    Induction: Intravenous  Airway Management Planned: LMA  Additional Equipment:   Intra-op Plan:   Post-operative Plan: Extubation in OR  Informed Consent: I have reviewed the patients History and Physical, chart, labs and discussed the procedure including the risks, benefits and alternatives for the proposed anesthesia with the patient or authorized representative who has indicated his/her understanding and acceptance.     Plan Discussed with: CRNA and Surgeon  Anesthesia Plan Comments:         Anesthesia Quick Evaluation  

## 2011-03-20 NOTE — Anesthesia Procedure Notes (Signed)
Procedure Name: LMA Insertion Date/Time: 03/20/2011 1:45 PM Performed by: Zenia Resides D Pre-anesthesia Checklist: Patient identified, Emergency Drugs available, Suction available, Patient being monitored and Timeout performed Patient Re-evaluated:Patient Re-evaluated prior to inductionOxygen Delivery Method: Circle System Utilized Preoxygenation: Pre-oxygenation with 100% oxygen Intubation Type: IV induction Ventilation: Mask ventilation without difficulty LMA: LMA inserted LMA Size: 3.0 Number of attempts: 1 Airway Equipment and Method: bite block Placement Confirmation: positive ETCO2 and breath sounds checked- equal and bilateral Tube secured with: Tape Dental Injury: Teeth and Oropharynx as per pre-operative assessment

## 2011-03-20 NOTE — Interval H&P Note (Signed)
History and Physical Interval Note:  03/20/2011 7:53 AM  Abigail [name]  has presented today for surgery, with the diagnosis of left breast cancer  The various methods of treatment have been discussed with the patient and family. After consideration of risks, benefits and other options for treatment, the patient has consented to  Procedure(s) (LRB): INSERTION PORT-A-CATH (N/A) as a surgical intervention .  The patients' history has been reviewed, patient examined, no change in status, stable for surgery.  I have reviewed the patients' chart and labs.  Questions were answered to the patient's satisfaction.     TOTH III,[name]

## 2011-03-20 NOTE — Anesthesia Postprocedure Evaluation (Signed)
Anesthesia Post Note  Patient: Abigail [name]  Procedure(s) Performed: Procedure(s) (LRB): INSERTION PORT-A-CATH (Right)  Anesthesia type: General  Patient location: PACU  Post pain: Pain level controlled  Post assessment: Patient's Cardiovascular Status Stable  Last Vitals:  Filed Vitals:   03/20/11 1545  BP: 130/66  Pulse: 76  Temp:   Resp: 12    Post vital signs: Reviewed and stable  Level of consciousness: alert  Complications: No apparent anesthesia complications

## 2011-03-20 NOTE — H&P (View-Only) (Signed)
Subjective:     Patient ID: Abigail [name], female   DOB: 07/18/1965, 45 y.o.   MRN: [id number]  HPI We're asked to see the patient in consultation by Dr. Jackson to evaluate her for a left breast cancer. The patient is a 45-year-old white female who recently went for a routine screening mammogram. Prior to this she was not having any breast pain or discharge from her nipple. She has otherwise been in good health. Her mammogram showed some abnormal calcifications. These were biopsied and the pathology showed an invasive ductal cancer. Her MRI estimated the size of the lesion to be 2.7 cm in the upper inner quadrant. She was ER PR positive and HER-2/neu negative. She otherwise has no complaints today.  Review of Systems  Constitutional: Negative.   HENT: Negative.   Eyes: Negative.   Respiratory: Negative.   Cardiovascular: Negative.   Gastrointestinal: Negative.   Genitourinary: Negative.   Musculoskeletal: Negative.   Skin: Negative.   Neurological: Negative.   Hematological: Negative.   Psychiatric/Behavioral: Negative.        Objective:   Physical Exam  Constitutional: She is oriented to person, place, and time. She appears well-developed and well-nourished.  HENT:  Head: Normocephalic and atraumatic.  Eyes: Conjunctivae and EOM are normal. Pupils are equal, round, and reactive to light.  Neck: Normal range of motion. Neck supple.  Cardiovascular: Normal rate, regular rhythm and normal heart sounds.   Pulmonary/Chest: Effort normal and breath sounds normal.       The patient has a palpable bruise in the upper portion of the left breast. No other palpable mass in either breast. No axillary supraclavicular or cervical lymphadenopathy  Abdominal: Soft. Bowel sounds are normal. She exhibits no mass. There is no tenderness.  Musculoskeletal: Normal range of motion.  Lymphadenopathy:    She has no cervical adenopathy.  Neurological: She is alert and oriented to person, place, and  time.  Skin: Skin is warm and dry.  Psychiatric: She has a normal mood and affect. Her behavior is normal.       Assessment:     The patient has a 2.7 cm invasive breast cancer in the upper inner left breast. At this point I think it would be difficult to do breast conservation and leave her with a fairly normal appearing breast because of the size of the lesion. I do think she would be a good candidate for neoadjuvant chemotherapy. If she has a good response then she may be able to have breast conservation versus mastectomy, whichever she would prefer. I've discussed this with her in detail and she is in agreement with this treatment plan. She will need a Port-A-Cath. I've discussed with her in detail the risks and benefits of the operation to place a port as well as some of the technical aspects and she understands and wishes to proceed    Plan:     Plan for Port-A-Cath placement and neoadjuvant chemotherapy      

## 2011-03-21 ENCOUNTER — Ambulatory Visit: Payer: BC Managed Care – PPO | Admitting: Physician Assistant

## 2011-03-21 ENCOUNTER — Ambulatory Visit (HOSPITAL_BASED_OUTPATIENT_CLINIC_OR_DEPARTMENT_OTHER): Payer: BC Managed Care – PPO

## 2011-03-21 ENCOUNTER — Other Ambulatory Visit (HOSPITAL_BASED_OUTPATIENT_CLINIC_OR_DEPARTMENT_OTHER): Payer: BC Managed Care – PPO | Admitting: Lab

## 2011-03-21 ENCOUNTER — Encounter (HOSPITAL_BASED_OUTPATIENT_CLINIC_OR_DEPARTMENT_OTHER): Payer: Self-pay | Admitting: General Surgery

## 2011-03-21 ENCOUNTER — Other Ambulatory Visit: Payer: Self-pay | Admitting: *Deleted

## 2011-03-21 VITALS — BP 107/66 | HR 67 | Temp 97.6°F

## 2011-03-21 DIAGNOSIS — C50219 Malignant neoplasm of upper-inner quadrant of unspecified female breast: Secondary | ICD-10-CM

## 2011-03-21 DIAGNOSIS — Z5111 Encounter for antineoplastic chemotherapy: Secondary | ICD-10-CM

## 2011-03-21 LAB — CBC WITH DIFFERENTIAL/PLATELET
Eosinophils Absolute: 0 10*3/uL (ref 0.0–0.5)
MCV: 94.9 fL (ref 79.5–101.0)
MONO#: 1 10*3/uL — ABNORMAL HIGH (ref 0.1–0.9)
MONO%: 7.9 % (ref 0.0–14.0)
NEUT#: 10.5 10*3/uL — ABNORMAL HIGH (ref 1.5–6.5)
RBC: 3.7 10*6/uL (ref 3.70–5.45)
RDW: 12.5 % (ref 11.2–14.5)
WBC: 12.9 10*3/uL — ABNORMAL HIGH (ref 3.9–10.3)
nRBC: 0 % (ref 0–0)

## 2011-03-21 MED ORDER — ONDANSETRON 16 MG/50ML IVPB (CHCC)
16.0000 mg | Freq: Once | INTRAVENOUS | Status: AC
  Administered 2011-03-21: 16 mg via INTRAVENOUS

## 2011-03-21 MED ORDER — HEPARIN SOD (PORK) LOCK FLUSH 100 UNIT/ML IV SOLN
500.0000 [IU] | Freq: Once | INTRAVENOUS | Status: AC | PRN
  Administered 2011-03-21: 500 [IU]
  Filled 2011-03-21: qty 5

## 2011-03-21 MED ORDER — DEXAMETHASONE SODIUM PHOSPHATE 4 MG/ML IJ SOLN
20.0000 mg | Freq: Once | INTRAMUSCULAR | Status: AC
  Administered 2011-03-21: 20 mg via INTRAVENOUS

## 2011-03-21 MED ORDER — SODIUM CHLORIDE 0.9 % IV SOLN
600.0000 mg/m2 | Freq: Once | INTRAVENOUS | Status: AC
  Administered 2011-03-21: 1000 mg via INTRAVENOUS
  Filled 2011-03-21: qty 50

## 2011-03-21 MED ORDER — SODIUM CHLORIDE 0.9 % IV SOLN
Freq: Once | INTRAVENOUS | Status: AC
  Administered 2011-03-21: 12:00:00 via INTRAVENOUS

## 2011-03-21 MED ORDER — SODIUM CHLORIDE 0.9 % IJ SOLN
10.0000 mL | INTRAMUSCULAR | Status: DC | PRN
  Administered 2011-03-21: 10 mL
  Filled 2011-03-21: qty 10

## 2011-03-21 MED ORDER — DOCETAXEL CHEMO INJECTION 160 MG/16ML
75.0000 mg/m2 | Freq: Once | INTRAVENOUS | Status: AC
  Administered 2011-03-21: 120 mg via INTRAVENOUS
  Filled 2011-03-21: qty 12

## 2011-03-21 NOTE — Patient Instructions (Signed)
[facility] Discharge Instructions for Patients Receiving Chemotherapy  Today you received the following chemotherapy agents Taxotere and cytoxan  To help prevent nausea and vomiting after your treatment, we encourage you to take your nausea medication as directed by MD   If you develop nausea and vomiting that is not controlled by your nausea medication, call the clinic. If it is after clinic hours your family physician or the after hours number for the clinic or go to the Emergency Department.   BELOW ARE SYMPTOMS THAT SHOULD BE REPORTED IMMEDIATELY:  *FEVER GREATER THAN 100.5 F  *CHILLS WITH OR WITHOUT FEVER  NAUSEA AND VOMITING THAT IS NOT CONTROLLED WITH YOUR NAUSEA MEDICATION  *UNUSUAL SHORTNESS OF BREATH  *UNUSUAL BRUISING OR BLEEDING  TENDERNESS IN MOUTH AND THROAT WITH OR WITHOUT PRESENCE OF ULCERS  *URINARY PROBLEMS  *BOWEL PROBLEMS  UNUSUAL RASH Items with * indicate a potential emergency and should be followed up as soon as possible.  One of the nurses will contact you 24 hours after your first treatment. Please let the nurse know about any problems that you may have experienced. Feel free to call the clinic you have any questions or concerns. The clinic phone number is [phone].   I have been informed and understand all the instructions given to me. I know to contact the clinic, my physician, or go to the Emergency Department if any problems should occur. I do not have any questions at this time, but understand that I may call the clinic during office hours   should I have any questions or need assistance in obtaining follow up care.    __________________________________________  _____________  __________ Signature of Patient or Authorized Representative            Date                   Time    __________________________________________ Nurse's Signature

## 2011-03-22 ENCOUNTER — Telehealth: Payer: Self-pay | Admitting: *Deleted

## 2011-03-22 ENCOUNTER — Ambulatory Visit (HOSPITAL_BASED_OUTPATIENT_CLINIC_OR_DEPARTMENT_OTHER): Payer: BC Managed Care – PPO

## 2011-03-22 DIAGNOSIS — C50219 Malignant neoplasm of upper-inner quadrant of unspecified female breast: Secondary | ICD-10-CM

## 2011-03-22 MED ORDER — PEGFILGRASTIM INJECTION 6 MG/0.6ML
6.0000 mg | Freq: Once | SUBCUTANEOUS | Status: AC
  Administered 2011-03-22: 6 mg via SUBCUTANEOUS
  Filled 2011-03-22: qty 0.6

## 2011-03-22 NOTE — Telephone Encounter (Signed)
Message left on answering machine asking for a return call for chemotherapy follow up.  Patient due for an injection at 11:00 am today.

## 2011-03-22 NOTE — Telephone Encounter (Signed)
Message copied by Augusto Garbe on Fri Mar 22, 2011 10:02 AM ------      Message from: HILL, DESIREE L      Created: Thu Mar 21, 2011  3:29 PM      Regarding: Chemo f/u call      Contact: [phone]       1st time taxotere/cytoxan Dr. Darnelle Catalan

## 2011-03-26 ENCOUNTER — Telehealth: Payer: Self-pay | Admitting: Oncology

## 2011-03-26 ENCOUNTER — Encounter: Payer: Self-pay | Admitting: Physician Assistant

## 2011-03-26 ENCOUNTER — Other Ambulatory Visit (HOSPITAL_BASED_OUTPATIENT_CLINIC_OR_DEPARTMENT_OTHER): Payer: BC Managed Care – PPO | Admitting: Lab

## 2011-03-26 ENCOUNTER — Ambulatory Visit (HOSPITAL_BASED_OUTPATIENT_CLINIC_OR_DEPARTMENT_OTHER): Payer: BC Managed Care – PPO | Admitting: Physician Assistant

## 2011-03-26 VITALS — BP 107/71 | HR 101 | Temp 98.2°F | Ht 66.0 in | Wt 133.6 lb

## 2011-03-26 DIAGNOSIS — C50219 Malignant neoplasm of upper-inner quadrant of unspecified female breast: Secondary | ICD-10-CM

## 2011-03-26 DIAGNOSIS — Z170 Estrogen receptor positive status [ER+]: Secondary | ICD-10-CM

## 2011-03-26 DIAGNOSIS — K219 Gastro-esophageal reflux disease without esophagitis: Secondary | ICD-10-CM

## 2011-03-26 LAB — CBC WITH DIFFERENTIAL/PLATELET
BASO%: 2.2 % — ABNORMAL HIGH (ref 0.0–2.0)
HCT: 34.3 % — ABNORMAL LOW (ref 34.8–46.6)
MCHC: 33.8 g/dL (ref 31.5–36.0)
MONO#: 0.1 10*3/uL (ref 0.1–0.9)
NEUT%: 24.2 % — ABNORMAL LOW (ref 38.4–76.8)
WBC: 0.9 10*3/uL — CL (ref 3.9–10.3)
lymph#: 0.6 10*3/uL — ABNORMAL LOW (ref 0.9–3.3)
nRBC: 0 % (ref 0–0)

## 2011-03-26 MED ORDER — OMEPRAZOLE 20 MG PO CPDR: 20.0000 mg | DELAYED_RELEASE_CAPSULE | Freq: Two times a day (BID) | ORAL | Status: DC

## 2011-03-26 MED ORDER — CIPROFLOXACIN HCL 500 MG PO TABS: 500.0000 mg | ORAL_TABLET | Freq: Two times a day (BID) | ORAL | Status: AC

## 2011-03-26 NOTE — Progress Notes (Signed)
Abigail [name]  DOB: 12-17-1965  MR#: [id number]  CSN#: [id number]    History of present illness:   The patient is a 46 year old [place] woman who had screening mammography at [facility] 02/01/2011, showing calcifications in the left breast. She returned for diagnostic left mammography 02/21/2011, and this showed a 1.1 cm cluster of pleomorphic microcalcifications in the upper inner aspect of the left breast. Biopsy of this area was performed the same day, and showed ([id number])  An invasive ductal carcinoma, grade 2, which was estrogen receptor positive at 89%, progesterone receptor positive at 99%, with an MIB-1 of 30% and no HER-2 amplification. The patient was then set up for bilateral breast MRIs, performed 02/26/2011. There were multiple small cysts in both breasts. In the upper inner quadrant of the left breast there was a 2.7 cm enhancing mass with ill-defined margins. This contained a biopsy clip. There was a hematoma superior to this mass, measuring 2.2 cm. There were no other suspicious areas in either breast and no abnormal appearing lymph nodes.  With this information the patient's case was presented at the multidisciplinary breast cancer conference 02/27/2011, and she was seen in the [facility] clinic the same day.  The patient is being treated in the neoadjuvant setting, the plan being to complete 4 q. three-week doses of docetaxel/cyclophosphamide, with Neulasta given on day 2 for granulocyte support. After completing neoadjuvant chemotherapy the patient will undergo definitive surgery, possibly followed by radiation therapy. She will also be on antiestrogen therapy, the plan being to continue for 5-10 years.   Interval History: Abigail [name] returns today for assessment chemotoxicity on day 6 cycle 1 of 4 planned q. three-week doses of docetaxel/cyclophosphamide given in the neoadjuvant setting for locally advanced left breast carcinoma. She received Neulasta on day 2 for granulocyte  support.  Over all, Abigail [name] tolerated treatment well. She felt well over the weekend, but notes that after completing her course of dexamethasone, the "fatigue kicked in" and she is still extremely weak and tired today on presentation. She has had no fevers, chills, or night sweats. She is eating and drinking well with no nausea but has had increased reflux which has been bothersome. No change in bowel habits. She's had some occasional headaches for which she's been taking Tylenol. No dizziness. She denies any peripheral neuropathy, and has had no skin changes, rashes, abnormal bleeding, tearing, or mouth ulcers.  A detailed review of systems is otherwise noncontributory as noted below.  Review of Systems: Constitutional: fatigue and general weakness, no weight loss, fever, night sweats Eyes: negative [id number] Cardiovascular: positive for - palpitations negative for - chest pain, dyspnea on exertion or edema Respiratory: no cough, shortness of breath Neurological: negative Dermatological: negative Gastrointestinal: positive for heartburn; no abdominal pain, nausea, change in bowel habits, or black or bloody stools Genito-Urinary: no dysuria, trouble voiding, or hematuria Hematological and Lymphatic: negative Breast: positive for - abnormality in left breast Musculoskeletal: negative Remaining ROS negative.    Past medical history:      Past Medical History  Diagnosis Date  . Chicken pox as a child  . Allergy     seasonal- spring  . Urticaria   . History of streptococcal pharyngitis   . History of mononucleosis   . Anemia     mild  . Allergic state 12/04/2010  . Preventative health care 12/04/2010  . Anxiety 02/11/2011  . Insomnia 02/11/2011  . PONV (postoperative nausea and vomiting)   . Cancer     Lt breast  CA  GERD  Past surgical history:      Past Surgical History  Procedure Date  . Mouth surgery 1997    gum surgery  . Cesarean section 1999    X 1  . Varicose  vein surgery     [place] [facility]  . Portacath placement 03/20/2011    Procedure: INSERTION PORT-A-CATH;  Surgeon: Robyne Askew, MD;  Location: [facility];  Service: General;  Laterality: Right;  placement of port -Right Subclavian Vein    Family history:     Family History  Problem Relation Age of Onset  . Adopted: Yes   the patient has no information regarding her biological family.  Gynecologic history: She had menarche age 83, is still having regular periods, most recently 02/23/2011. She used birth control pills for approximately 10 years, with no clotting or other complications. She is GX P2. Age at first live birth was 1.    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 Her husband Abigail [name] is in accounting. Son Abigail [name] (pronounced with a hard "g") is 15 and in the ninth grade, daughter Abigail [name] is 13-1/7 grade.     ADVANCED DIRECTIVES: Not in place  Health maintenance:       History  Substance Use Topics  . Smoking status: Never Smoker   . Smokeless tobacco: Never Used  . Alcohol Use: 0.6 oz/week    1 Glasses of wine per week     occasionally- social      Colonoscopy: Never  PAP: January 2013  Bone density: Never  Cholesterol  Allergies:     Allergies  Allergen Reactions  . Penicillins Rash    Medications:      Current Outpatient Prescriptions  Medication Sig Dispense Refill  . Calcium Carbonate-Vitamin D (CALCIUM + D PO) Take 1 tablet by mouth 2 (two) times daily.        . ciprofloxacin (CIPRO) 500 MG tablet Take 1 tablet (500 mg total) by mouth 2 (two) times daily.  14 tablet  3  . dexamethasone (DECADRON) 4 MG tablet Take 2 tablets two times a day the day before Taxotere. Then take 2 tabs two times a day starting the day after chemo for 3 days.  30 tablet  1  . diazepam (VALIUM) 5 MG tablet Take 1/2 tablet to 2 tablets daily as needed  20 tablet  0  . HYDROcodone-acetaminophen (NORCO) 5-325 MG  per tablet Take 1-2 tablets by mouth every 4 (four) hours as needed for pain.  50 tablet  1  . lidocaine-prilocaine (EMLA) cream Apply as directed to port site 1 hour before tx  30 g  1  . LORazepam (ATIVAN) 0.5 MG tablet       . LORazepam (ATIVAN) 0.5 MG tablet Take 1 tablet (0.5 mg total) by mouth every 6 (six) hours as needed (Nausea or vomiting).  30 tablet  0  . Melatonin 3 MG TABS Take 1 tablet by mouth at bedtime as needed.      . Multiple Vitamin (MULTIVITAMIN) tablet Take 1 tablet by mouth daily.        Marland Kitchen omeprazole (PRILOSEC) 20 MG capsule Take 1 capsule (20 mg total) by mouth 2 (two) times daily.  60 capsule  3  . ondansetron (ZOFRAN) 8 MG tablet Take 1 tablet two times a day starting the day after chemo for 3 days. Then take 1 tab two times a day as  needed for nausea or vomiting.  30 tablet  1  . prochlorperazine (COMPAZINE) 10 MG tablet Take 1 tablet (10 mg total) by mouth every 6 (six) hours as needed (Nausea or vomiting).  30 tablet  1  . tobramycin-dexamethasone (TOBRADEX) ophthalmic solution Place 1 drop into both eyes 2 (two) times daily.  5 mL  0  . zolpidem (AMBIEN) 10 MG tablet Take 5 mg by mouth at bedtime as needed. Takes 1/2 of 10 mg tablet as needed related to shift work.        Physical exam:  Young appearing middle aged white woman who appears anxious    Filed Vitals:   03/26/11 0851  BP: 107/71  Pulse: 101  Temp: 98.2 F (36.8 C)     Body mass index is 21.56 kg/(m^2).  ECOG PS: 0  HEENT:  Sclerae anicteric, conjunctivae pink.  Oropharynx clear.  No mucositis or candidiasis.   Nodes:  No cervical, supraclavicular, or axillary lymphadenopathy palpated.  Breast Exam:  Deferred Lungs:  Clear to auscultation bilaterally.  No crackles, rhonchi, or wheezes.   Heart:  Regular rate and rhythm.   Abdomen:  Soft, nontender.  Positive bowel sounds.  No organomegaly or masses palpated.   Musculoskeletal:  No focal spinal tenderness to palpation.  Extremities:  Benign.  No  peripheral edema or cyanosis.   Skin:  Benign.   Neuro:  Nonfocal.   Lab Results: Lab Results  Component Value Date   WBC 0.9* 03/26/2011   HGB 11.6 03/26/2011   HCT 34.3* 03/26/2011   MCV 92.5 03/26/2011   PLT 158 03/26/2011   NEUTROABS 0.2* 03/26/2011     Chemistry      Component Value Date/Time   NA 143 02/27/2011 1236   K 3.5 02/27/2011 1236   CL 104 02/27/2011 1236   CO2 30 02/27/2011 1236   BUN 21 02/27/2011 1236   CREATININE 0.79 02/27/2011 1236      Component Value Date/Time   CALCIUM 9.9 02/27/2011 1236   ALKPHOS 44 02/27/2011 1236   AST 19 02/27/2011 1236   ALT 14 02/27/2011 1236   BILITOT 0.3 02/27/2011 1236      Studies:     Dg Chest Port 1 View  03/20/2011  *RADIOLOGY REPORT*  Clinical Data: Port-A-Cath insertion  PORTABLE CHEST - 1 VIEW  Comparison: None.  Findings: Power port has been placed on the right, entering the internal jugular vein with its tip just above the SVC/RA junction. Heart size is normal.  No pneumothorax.  Lungs are clear.  IMPRESSION: Power port well positioned with its tip at the SVC/RA junction.  No apparent complication.  Original Report Authenticated By: Thomasenia Sales, M.D.   Dg Fluoro Guide Cv Line-no Report  03/20/2011  CLINICAL DATA: Portcath Insertaion   FLOURO GUIDE CV LINE  Fluoroscopy was utilized by the requesting physician.  No radiographic  interpretation.      Assessment: 46 year old [place] woman  (1)   status post left breast biopsy 10 or 24th 2013, for a clinical T2 N0, stage IIA invasive ductal carcinoma, grade 2, strongly estrogen and progesterone receptor positive, HER-2 not amplified, with an MIB-1 of 30%.      (2)  being treated in the neoadjuvant setting, the plan being to complete 4 doses of docetaxel/ifosfamide, given every 3 weeks.    Plan:  Abigail [name] tolerated treatment well overall, with the exception of a "crash" after coming off the dexamethasone. We discussed a short term dexamethasone taper with cycle  2, and Abigail [name]  was given written instructions accordingly. We also reviewed neutropenic precautions today. I started her on Cipro prophylactically, 500 mg by mouth twice a day for the next 7 days. She knows to call any fevers 100 or above. We will repeat her labs only next week on March 6.  She was started on omeprazole, 20 mg by mouth twice a day, for the increased reflux, likely secondary to the dexamethasone.  Otherwise I will see Abigail [name] again in 2 weeks, March 14, in anticipation of her second dose of chemotherapy.  At the completion of chemotherapy, the patient will have her definitive surgery, and at that point a decision will be made whether or not she will benefit from radiation treatment. In any case she will eventually start endocrine therapy, which will continue for 5-10 years.  This plan has been reviewed with the patient who voices her understanding and agreement with our plan. She will call with any changes or problems.   Amaranta Mehl  PA-C 03/26/2011

## 2011-03-26 NOTE — Telephone Encounter (Signed)
gve the pt her march,april 2013 appt calendar 

## 2011-03-28 ENCOUNTER — Other Ambulatory Visit: Payer: Self-pay | Admitting: Certified Registered Nurse Anesthetist

## 2011-04-03 ENCOUNTER — Other Ambulatory Visit (HOSPITAL_BASED_OUTPATIENT_CLINIC_OR_DEPARTMENT_OTHER): Payer: BC Managed Care – PPO | Admitting: Lab

## 2011-04-03 DIAGNOSIS — C50219 Malignant neoplasm of upper-inner quadrant of unspecified female breast: Secondary | ICD-10-CM

## 2011-04-03 LAB — CBC WITH DIFFERENTIAL/PLATELET
Basophils Absolute: 0 10*3/uL (ref 0.0–0.1)
Eosinophils Absolute: 0 10*3/uL (ref 0.0–0.5)
HGB: 11.7 g/dL (ref 11.6–15.9)
MCV: 95.6 fL (ref 79.5–101.0)
MONO#: 0.4 10*3/uL (ref 0.1–0.9)
NEUT#: 7.6 10*3/uL — ABNORMAL HIGH (ref 1.5–6.5)
RDW: 12.8 % (ref 11.2–14.5)
WBC: 9.5 10*3/uL (ref 3.9–10.3)
lymph#: 1.4 10*3/uL (ref 0.9–3.3)

## 2011-04-11 ENCOUNTER — Other Ambulatory Visit (HOSPITAL_BASED_OUTPATIENT_CLINIC_OR_DEPARTMENT_OTHER): Payer: BC Managed Care – PPO | Admitting: Lab

## 2011-04-11 ENCOUNTER — Ambulatory Visit (HOSPITAL_BASED_OUTPATIENT_CLINIC_OR_DEPARTMENT_OTHER): Payer: BC Managed Care – PPO

## 2011-04-11 ENCOUNTER — Encounter: Payer: Self-pay | Admitting: Physician Assistant

## 2011-04-11 ENCOUNTER — Ambulatory Visit (HOSPITAL_BASED_OUTPATIENT_CLINIC_OR_DEPARTMENT_OTHER): Payer: BC Managed Care – PPO | Admitting: Physician Assistant

## 2011-04-11 ENCOUNTER — Telehealth: Payer: Self-pay | Admitting: *Deleted

## 2011-04-11 VITALS — BP 114/74 | HR 80 | Temp 97.8°F | Ht 66.0 in | Wt 131.2 lb

## 2011-04-11 DIAGNOSIS — Z170 Estrogen receptor positive status [ER+]: Secondary | ICD-10-CM

## 2011-04-11 DIAGNOSIS — Z5111 Encounter for antineoplastic chemotherapy: Secondary | ICD-10-CM

## 2011-04-11 DIAGNOSIS — C50219 Malignant neoplasm of upper-inner quadrant of unspecified female breast: Secondary | ICD-10-CM

## 2011-04-11 LAB — COMPREHENSIVE METABOLIC PANEL
CO2: 27 mEq/L (ref 19–32)
Calcium: 10.1 mg/dL (ref 8.4–10.5)
Chloride: 102 mEq/L (ref 96–112)
Creatinine, Ser: 0.56 mg/dL (ref 0.50–1.10)
Glucose, Bld: 121 mg/dL — ABNORMAL HIGH (ref 70–99)
Sodium: 138 mEq/L (ref 135–145)
Total Bilirubin: 0.3 mg/dL (ref 0.3–1.2)
Total Protein: 7.3 g/dL (ref 6.0–8.3)

## 2011-04-11 LAB — CBC WITH DIFFERENTIAL/PLATELET
Basophils Absolute: 0 10*3/uL (ref 0.0–0.1)
Eosinophils Absolute: 0 10*3/uL (ref 0.0–0.5)
HGB: 12.4 g/dL (ref 11.6–15.9)
LYMPH%: 4.7 % — ABNORMAL LOW (ref 14.0–49.7)
MCV: 93.2 fL (ref 79.5–101.0)
MONO%: 2.5 % (ref 0.0–14.0)
NEUT#: 13.2 10*3/uL — ABNORMAL HIGH (ref 1.5–6.5)
Platelets: 326 10*3/uL (ref 145–400)

## 2011-04-11 MED ORDER — SODIUM CHLORIDE 0.9 % IV SOLN
600.0000 mg/m2 | Freq: Once | INTRAVENOUS | Status: AC
  Administered 2011-04-11: 1000 mg via INTRAVENOUS
  Filled 2011-04-11: qty 50

## 2011-04-11 MED ORDER — SODIUM CHLORIDE 0.9 % IV SOLN: Freq: Once | INTRAVENOUS | Status: DC

## 2011-04-11 MED ORDER — ONDANSETRON 16 MG/50ML IVPB (CHCC)
16.0000 mg | Freq: Once | INTRAVENOUS | Status: AC
  Administered 2011-04-11: 16 mg via INTRAVENOUS

## 2011-04-11 MED ORDER — HEPARIN SOD (PORK) LOCK FLUSH 100 UNIT/ML IV SOLN
500.0000 [IU] | Freq: Once | INTRAVENOUS | Status: AC | PRN
  Administered 2011-04-11: 500 [IU]
  Filled 2011-04-11: qty 5

## 2011-04-11 MED ORDER — DOCETAXEL CHEMO INJECTION 160 MG/16ML
75.0000 mg/m2 | Freq: Once | INTRAVENOUS | Status: AC
  Administered 2011-04-11: 120 mg via INTRAVENOUS
  Filled 2011-04-11: qty 12

## 2011-04-11 MED ORDER — DEXAMETHASONE SODIUM PHOSPHATE 4 MG/ML IJ SOLN
20.0000 mg | Freq: Once | INTRAMUSCULAR | Status: AC
  Administered 2011-04-11: 20 mg via INTRAVENOUS

## 2011-04-11 MED ORDER — SODIUM CHLORIDE 0.9 % IJ SOLN
10.0000 mL | INTRAMUSCULAR | Status: DC | PRN
  Administered 2011-04-11: 10 mL
  Filled 2011-04-11: qty 10

## 2011-04-11 NOTE — Telephone Encounter (Signed)
made patient for 05-23-2011 and 05-24-2011 and 05-30-2011 printed out calendar and gave to the patient emailed michelle to added on treatment for 05-23-2011

## 2011-04-11 NOTE — Progress Notes (Signed)
ID: Abigail [name]   DOB: 1965-10-08  MR#: [id number]  WUJ#:[id number]  HISTORY OF PRESENT ILLNESS: The patient is a 46 year old [place] woman who had screening mammography at [facility] 02/01/2011, showing calcifications in the left breast. She returned for diagnostic left mammography 02/21/2011, and this showed a 1.1 cm cluster of pleomorphic microcalcifications in the upper inner aspect of the left breast. Biopsy of this area was performed the same day, and showed ([id number]) An invasive ductal carcinoma, grade 2, which was estrogen receptor positive at 89%, progesterone receptor positive at 99%, with an MIB-1 of 30% and no Abigail [name]-2 amplification. The patient was then set up for bilateral breast MRIs, performed 02/26/2011. There were multiple small cysts in both breasts. In the upper inner quadrant of the left breast there was a 2.7 cm enhancing mass with ill-defined margins. This contained a biopsy clip. There was a hematoma superior to this mass, measuring 2.2 cm. There were no other suspicious areas in either breast and no abnormal appearing lymph nodes.  With this information the patient's case was presented at the multidisciplinary breast cancer conference 02/27/2011, and she was seen in the [facility] clinic the same day.  The patient is being treated in the neoadjuvant setting, the plan being to complete 4 q. three-week doses of docetaxel/cyclophosphamide prior to definitive surgery.   INTERVAL HISTORY: Abigail [name] returns today accompanied by Abigail [name] husband for followup of Abigail [name] locally advanced left breast carcinoma. She is due for day 1 cycle 2 of 4 planned q. three-week doses of docetaxel/cyclophosphamide today, being given in the neoadjuvant setting.  Abigail [name] recovered from cycle 1 quite well, and is feeling well today. Abigail [name] energy level is very good. 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 She completed Abigail [name] course of prophylactic Cipro, and had no fevers, chills, or night sweats.  She did develop some "pimples" on Abigail [name] face although they are beginning to clear again. She is sleeping better night with the use of some Ambien.  REVIEW OF SYSTEMS: Abigail [name] has had no nausea or emesis, and denies change in bowel habits. No mouth ulcers or oral sensitivity. Abigail [name] reflux has resolved. She's had no signs of peripheral neuropathy in either the upper or lower extremities. No skin changes or rashes. No abnormal bleeding. No tearing.  In fact a detailed review of systems is otherwise unremarkable today.  PAST MEDICAL HISTORY: Past Medical History  Diagnosis Date  . Chicken pox as a child  . Allergy     seasonal- spring  . Urticaria   . History of streptococcal pharyngitis   . History of mononucleosis   . Anemia     mild  . Allergic state 12/04/2010  . Preventative health care 12/04/2010  . Anxiety 02/11/2011  . Insomnia 02/11/2011  . PONV (postoperative nausea and vomiting)   . Cancer     Lt breast CA    PAST SURGICAL HISTORY: Past Surgical History  Procedure Date  . Mouth surgery 1997    gum surgery  . Cesarean section 1999    X 1  . Varicose vein surgery     [place] [facility]  . Portacath placement 03/20/2011    Procedure: INSERTION PORT-A-CATH;  Surgeon: Robyne Askew, MD;  Location: [facility];  Service: General;  Laterality: Right;  placement of port -Right Subclavian Vein    FAMILY HISTORY Family History  Problem Relation Age of Onset  . Adopted: Yes    Gynecologic history: She had  menarche age 31, is still having regular periods, most recently 02/23/2011. She used birth control pills for approximately 10 years, with no clotting or other complications. She is GX P2. Age at first live birth was 34.   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 Abigail [name] husband Abigail [name] is in accounting. Son Abigail [name] (pronounced with a hard "g") is 15 and in the ninth grade, daughter Abigail [name] is 13-1/7 grade.   ADVANCED  DIRECTIVES: Not in place  HEALTH MAINTENANCE: History  Substance Use Topics  . Smoking status: Never Smoker   . Smokeless tobacco: Never Used  . Alcohol Use: 0.6 oz/week    1 Glasses of wine per week     occasionally- social     Colonoscopy:  PAP:  Bone density:  Lipid panel:  Allergies  Allergen Reactions  . Penicillins Rash    Current Outpatient Prescriptions  Medication Sig Dispense Refill  . Calcium Carbonate-Vitamin D (CALCIUM + D PO) Take 1 tablet by mouth 2 (two) times daily.        Marland Kitchen dexamethasone (DECADRON) 4 MG tablet Take 2 tablets two times a day the day before Taxotere. Then take 2 tabs two times a day starting the day after chemo for 3 days.  30 tablet  1  . HYDROcodone-acetaminophen (NORCO) 5-325 MG per tablet       . lidocaine-prilocaine (EMLA) cream Apply as directed to port site 1 hour before tx  30 g  1  . LORazepam (ATIVAN) 0.5 MG tablet Take 1 tablet (0.5 mg total) by mouth every 6 (six) hours as needed (Nausea or vomiting).  30 tablet  0  . Melatonin 3 MG TABS Take 1 tablet by mouth at bedtime as needed.      . Multiple Vitamin (MULTIVITAMIN) tablet Take 1 tablet by mouth daily.        Marland Kitchen omeprazole (PRILOSEC) 20 MG capsule Take 1 capsule (20 mg total) by mouth 2 (two) times daily.  60 capsule  3  . ondansetron (ZOFRAN) 8 MG tablet Take 1 tablet two times a day starting the day after chemo for 3 days. Then take 1 tab two times a day as needed for nausea or vomiting.  30 tablet  1  . prochlorperazine (COMPAZINE) 10 MG tablet Take 1 tablet (10 mg total) by mouth every 6 (six) hours as needed (Nausea or vomiting).  30 tablet  1  . tobramycin-dexamethasone (TOBRADEX) ophthalmic solution       . zolpidem (AMBIEN) 10 MG tablet Take 5 mg by mouth at bedtime as needed. Takes 1/2 of 10 mg tablet as needed related to shift work.      . diazepam (VALIUM) 5 MG tablet Take 1/2 tablet to 2 tablets daily as needed  20 tablet  0    OBJECTIVE: Filed Vitals:   04/11/11  1141  BP: 114/74  Pulse: 80  Temp: 97.8 F (36.6 C)     Body mass index is 21.18 kg/(m^2).    ECOG FS: 0  Physical Exam: HEENT:  Sclerae anicteric, conjunctivae pink.  Oropharynx clear.  No mucositis or candidiasis.   Nodes:  No cervical, supraclavicular, or axillary lymphadenopathy palpated.  Breast Exam:  Right breast is benign, no masses, skin changes, or nipple inversion. Port is intact in the right upper chest wall, with no erythema or edema. No evidence of infection. Left breast, palpable thickening in the upper inner quadrant of the left breast with apparent seroma just underneath  the biopsy scar. No obvious skin changes.   Lungs:  Clear to auscultation bilaterally.  No crackles, rhonchi, or wheezes.   Heart:  Regular rate and rhythm.  No gallops, murmurs, or rubs. Abdomen:  Soft, nontender.  Positive bowel sounds.  No organomegaly or masses palpated.   Musculoskeletal:  No focal spinal tenderness to palpation.  Extremities:  Benign.  No peripheral edema or cyanosis.   Skin:  Benign.   Neuro:  Nonfocal.    LAB RESULTS: Lab Results  Component Value Date   WBC 14.2* 04/11/2011   NEUTROABS 13.2* 04/11/2011   HGB 12.4 04/11/2011   HCT 37.0 04/11/2011   MCV 93.2 04/11/2011   PLT 326 04/11/2011      Chemistry      Component Value Date/Time   NA 143 02/27/2011 1236   K 3.5 02/27/2011 1236   CL 104 02/27/2011 1236   CO2 30 02/27/2011 1236   BUN 21 02/27/2011 1236   CREATININE 0.79 02/27/2011 1236      Component Value Date/Time   CALCIUM 9.9 02/27/2011 1236   ALKPHOS 44 02/27/2011 1236   AST 19 02/27/2011 1236   ALT 14 02/27/2011 1236   BILITOT 0.3 02/27/2011 1236       Lab Results  Component Value Date   LABCA2 23 02/27/2011     STUDIES: Dg Chest Port 1 View  03/20/2011  *RADIOLOGY REPORT*  Clinical Data: Port-A-Cath insertion  PORTABLE CHEST - 1 VIEW  Comparison: None.  Findings: Power port has been placed on the right, entering the internal jugular vein with its tip just  above the SVC/RA junction. Heart size is normal.  No pneumothorax.  Lungs are clear.  IMPRESSION: Power port well positioned with its tip at the SVC/RA junction.  No apparent complication.  Original Report Authenticated By: Thomasenia Sales, M.D.   Dg Fluoro Guide Cv Line-no Report  03/20/2011  CLINICAL DATA: Portcath Insertaion   FLOURO GUIDE CV LINE  Fluoroscopy was utilized by the requesting physician.  No radiographic  interpretation.      ASSESSMENT: 46 year old [place] woman  (1) status post left breast biopsy 10 or 24th 2013, for a clinical T2 N0, stage IIA invasive ductal carcinoma, grade 2, strongly estrogen and progesterone receptor positive, Abigail [name]-2 not amplified, with an MIB-1 of 30%.  (2) being treated in the neoadjuvant setting, the plan being to complete 4 doses of docetaxel/ifosfamide, given every 3 weeks.   PLAN: Abigail [name] will proceed to treatment today as scheduled for Abigail [name] second cycle of docetaxel/cyclophosphamide. She will return tomorrow for Neulasta, and I will see Abigail [name] next week on March 20 for assessment of chemotoxicity. We have scheduled Abigail [name] out through Abigail [name] fourth and final dose which will be on April 25. Following Abigail [name] fourth cycle, we will repeat a breast MRI prior to Abigail [name] an appointment with Dr. Darnelle Catalan on May 2.  Abigail [name] voices understanding and agreement with this plan, and noticed a call with any changes or problems prior to Abigail [name] next scheduled appointment.  Abigail [name]    04/11/2011

## 2011-04-12 ENCOUNTER — Ambulatory Visit (HOSPITAL_BASED_OUTPATIENT_CLINIC_OR_DEPARTMENT_OTHER): Payer: BC Managed Care – PPO

## 2011-04-12 VITALS — BP 102/68 | HR 79 | Temp 98.2°F

## 2011-04-12 DIAGNOSIS — Z5189 Encounter for other specified aftercare: Secondary | ICD-10-CM

## 2011-04-12 DIAGNOSIS — C50219 Malignant neoplasm of upper-inner quadrant of unspecified female breast: Secondary | ICD-10-CM

## 2011-04-12 MED ORDER — PEGFILGRASTIM INJECTION 6 MG/0.6ML
6.0000 mg | Freq: Once | SUBCUTANEOUS | Status: AC
  Administered 2011-04-12: 6 mg via SUBCUTANEOUS
  Filled 2011-04-12: qty 0.6

## 2011-04-17 ENCOUNTER — Telehealth: Payer: Self-pay | Admitting: *Deleted

## 2011-04-17 ENCOUNTER — Ambulatory Visit (HOSPITAL_BASED_OUTPATIENT_CLINIC_OR_DEPARTMENT_OTHER): Payer: BC Managed Care – PPO | Admitting: Physician Assistant

## 2011-04-17 ENCOUNTER — Other Ambulatory Visit (HOSPITAL_BASED_OUTPATIENT_CLINIC_OR_DEPARTMENT_OTHER): Payer: BC Managed Care – PPO | Admitting: Lab

## 2011-04-17 ENCOUNTER — Encounter: Payer: Self-pay | Admitting: Physician Assistant

## 2011-04-17 VITALS — BP 106/67 | HR 85 | Temp 98.6°F | Ht 66.0 in | Wt 131.0 lb

## 2011-04-17 DIAGNOSIS — Z09 Encounter for follow-up examination after completed treatment for conditions other than malignant neoplasm: Secondary | ICD-10-CM

## 2011-04-17 DIAGNOSIS — Z170 Estrogen receptor positive status [ER+]: Secondary | ICD-10-CM

## 2011-04-17 DIAGNOSIS — C50219 Malignant neoplasm of upper-inner quadrant of unspecified female breast: Secondary | ICD-10-CM

## 2011-04-17 LAB — CBC WITH DIFFERENTIAL/PLATELET
Eosinophils Absolute: 0 10*3/uL (ref 0.0–0.5)
MONO#: 2.5 10*3/uL — ABNORMAL HIGH (ref 0.1–0.9)
NEUT#: 2.9 10*3/uL (ref 1.5–6.5)
Platelets: 223 10*3/uL (ref 145–400)
RBC: 3.82 10*6/uL (ref 3.70–5.45)
RDW: 12.5 % (ref 11.2–14.5)
WBC: 7 10*3/uL (ref 3.9–10.3)
nRBC: 0 % (ref 0–0)

## 2011-04-17 NOTE — Progress Notes (Signed)
ID: Abigail [name]   DOB: [birth year]  MR#: [id number]  GEX#:[id number]  HISTORY OF PRESENT ILLNESS: The patient is a 46 year old [place] woman who had screening mammography at [facility] 02/01/2011, showing calcifications in the left breast. She returned for diagnostic left mammography 02/21/2011, and this showed a 1.1 cm cluster of pleomorphic microcalcifications in the upper inner aspect of the left breast. Biopsy of this area was performed the same day, and showed ([id number]) An invasive ductal carcinoma, grade 2, which was estrogen receptor positive at 89%, progesterone receptor positive at 99%, with an MIB-1 of 30% and no HER-2 amplification. The patient was then set up for bilateral breast MRIs, performed 02/26/2011. There were multiple small cysts in both breasts. In the upper inner quadrant of the left breast there was a 2.7 cm enhancing mass with ill-defined margins. This contained a biopsy clip. There was a hematoma superior to this mass, measuring 2.2 cm. There were no other suspicious areas in either breast and no abnormal appearing lymph nodes.  With this information the patient's case was presented at the multidisciplinary breast cancer conference 02/27/2011, and she was seen in the [facility] clinic the same day.  The patient is being treated in the neoadjuvant setting, the plan being to complete 4 q. three-week doses of docetaxel/cyclophosphamide prior to definitive surgery.   INTERVAL HISTORY: Abigail [name] returns today for system of chemotoxicity on day 7 cycle 2 of 4 planned q. three-week doses of docetaxel/cyclophosphamide being given in the neoadjuvant setting for locally advanced left breast carcinoma. She received Neulasta on day 2 for granulocyte support.  Abigail [name] tolerated her second cycle very well, and in fact had fewer side effects than with the first cycle.  REVIEW OF SYSTEMS: She did have some mild constipation which she is treating affectively at home. Her tongue is a little  sensitive, but no oral ulcerations or problems swallowing. She started her period "right one time" last Friday, 04/12/2011. It was lighter than usual. She's had no problems with nausea or emesis. She had a few bony aches following Neulasta, but these were mild and resolved quickly. 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  In fact a detailed review of systems is otherwise unremarkable today.  PAST MEDICAL HISTORY: Past Medical History  Diagnosis Date  . Chicken pox as a child  . Allergy     seasonal- spring  . Urticaria   . History of streptococcal pharyngitis   . History of mononucleosis   . Anemia     mild  . Allergic state 12/04/2010  . Preventative health care 12/04/2010  . Anxiety 02/11/2011  . Insomnia 02/11/2011  . PONV (postoperative nausea and vomiting)   . Cancer     Lt breast CA    PAST SURGICAL HISTORY: Past Surgical History  Procedure Date  . Mouth surgery 1997    gum surgery  . Cesarean section 1999    X 1  . Varicose vein surgery     [place] vascular and vein  . Portacath placement 03/20/2011    Procedure: INSERTION PORT-A-CATH;  Surgeon: Robyne Askew, MD;  Location: [facility];  Service: General;  Laterality: Right;  placement of port -Right Subclavian Vein    FAMILY HISTORY Family History  Problem Relation Age of Onset  . Adopted: Yes    Gynecologic history: She had menarche age 7, is still having regular  periods, most recently 02/23/2011. She used birth control pills for approximately 10 years, with no clotting or other complications. She is GX P2. Age at first live birth was 21.   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 Her husband Abigail [name] is in accounting. Son Abigail [name] (pronounced with a hard "g") is 15 and in the ninth grade, daughter Abigail [name] is 13-1/7 grade.   ADVANCED DIRECTIVES: Not in  place  HEALTH MAINTENANCE: History  Substance Use Topics  . Smoking status: Never Smoker   . Smokeless tobacco: Never Used  . Alcohol Use: 0.6 oz/week    1 Glasses of wine per week     occasionally- social     Colonoscopy:  PAP:  Bone density:  Lipid panel:  Allergies  Allergen Reactions  . Penicillins Rash    Current Outpatient Prescriptions  Medication Sig Dispense Refill  . Calcium Carbonate-Vitamin D (CALCIUM + D PO) Take 1 tablet by mouth 2 (two) times daily.        Marland Kitchen dexamethasone (DECADRON) 4 MG tablet Take 2 tablets two times a day the day before Taxotere. Then take 2 tabs two times a day starting the day after chemo for 3 days.  30 tablet  1  . diazepam (VALIUM) 5 MG tablet Take 1/2 tablet to 2 tablets daily as needed  20 tablet  0  . HYDROcodone-acetaminophen (NORCO) 5-325 MG per tablet       . lidocaine-prilocaine (EMLA) cream Apply as directed to port site 1 hour before tx  30 g  1  . LORazepam (ATIVAN) 0.5 MG tablet Take 1 tablet (0.5 mg total) by mouth every 6 (six) hours as needed (Nausea or vomiting).  30 tablet  0  . Melatonin 3 MG TABS Take 1 tablet by mouth at bedtime as needed.      . Multiple Vitamin (MULTIVITAMIN) tablet Take 1 tablet by mouth daily.        Marland Kitchen omeprazole (PRILOSEC) 20 MG capsule Take 1 capsule (20 mg total) by mouth 2 (two) times daily.  60 capsule  3  . ondansetron (ZOFRAN) 8 MG tablet Take 1 tablet two times a day starting the day after chemo for 3 days. Then take 1 tab two times a day as needed for nausea or vomiting.  30 tablet  1  . prochlorperazine (COMPAZINE) 10 MG tablet Take 1 tablet (10 mg total) by mouth every 6 (six) hours as needed (Nausea or vomiting).  30 tablet  1  . tobramycin-dexamethasone (TOBRADEX) ophthalmic solution       . zolpidem (AMBIEN) 10 MG tablet Take 5 mg by mouth at bedtime as needed. Takes 1/2 of 10 mg tablet as needed related to shift work.        OBJECTIVE: Filed Vitals:   04/17/11 1110  BP: 106/67   Pulse: 85  Temp: 98.6 F (37 C)     Body mass index is 21.14 kg/(m^2).    ECOG FS: 0  Physical Exam: HEENT:  Sclerae anicteric, conjunctivae pink.  Oropharynx clear.  No mucositis or candidiasis.   Nodes:  No cervical, supraclavicular, or axillary lymphadenopathy palpated.  Breast Exam:  Deferred. Lungs:  Clear to auscultation bilaterally.  No crackles, rhonchi, or wheezes.   Heart:  Regular rate and rhythm.  No gallops, murmurs, or rubs. Abdomen:  Soft, nontender.  Positive bowel sounds.  No organomegaly or masses palpated.   Musculoskeletal:  No focal spinal tenderness to palpation.  Extremities:  Benign.  No peripheral edema or cyanosis.   Skin:  Benign.   Neuro:  Nonfocal. Alert and oriented x3.  LAB RESULTS: Lab Results  Component Value Date   WBC 7.0 04/17/2011   NEUTROABS 2.9 04/17/2011   HGB 11.9 04/17/2011   HCT 35.1 04/17/2011   MCV 91.9 04/17/2011   PLT 223 04/17/2011      Chemistry      Component Value Date/Time   NA 138 04/11/2011 1131   K 3.8 04/11/2011 1131   CL 102 04/11/2011 1131   CO2 27 04/11/2011 1131   BUN 25* 04/11/2011 1131   CREATININE 0.56 04/11/2011 1131      Component Value Date/Time   CALCIUM 10.1 04/11/2011 1131   ALKPHOS 102 04/11/2011 1131   AST 23 04/11/2011 1131   ALT 39* 04/11/2011 1131   BILITOT 0.3 04/11/2011 1131       Lab Results  Component Value Date   LABCA2 23 02/27/2011     STUDIES: No recent studies.   ASSESSMENT: 46 year old [place] woman  (1) status post left breast biopsy 10 or 24th 2013, for a clinical T2 N0, stage IIA invasive ductal carcinoma, grade 2, strongly estrogen and progesterone receptor positive, HER-2 not amplified, with an MIB-1 of 30%.  (2) being treated in the neoadjuvant setting, the plan being to complete 4 doses of docetaxel/ifosfamide, given every 3 weeks.   PLAN: Dyann tolerated her second cycle of chemotherapy quite well, and will return in 2 weeks on April 4 for her third scheduled cycle of  docetaxel/cyclophosphamide. She scheduled out through her fourth cycle in late April, and we will schedule her for her repeat breast MRI prior to seeing Dr. Darnelle Catalan on May 2.  Patient voices understanding and agreement with our plan, and as always, knows to call with any changes or problems.Shaasia Odle    04/17/2011

## 2011-04-17 NOTE — Telephone Encounter (Signed)
called Abigail [name] at the breast center to inform her the patient does have an order in for a mri of the breast per Olegario Messier she will call the patient with the date and time

## 2011-05-02 ENCOUNTER — Encounter: Payer: Self-pay | Admitting: Physician Assistant

## 2011-05-02 ENCOUNTER — Other Ambulatory Visit (HOSPITAL_BASED_OUTPATIENT_CLINIC_OR_DEPARTMENT_OTHER): Payer: BC Managed Care – PPO | Admitting: Lab

## 2011-05-02 ENCOUNTER — Ambulatory Visit (HOSPITAL_BASED_OUTPATIENT_CLINIC_OR_DEPARTMENT_OTHER): Payer: BC Managed Care – PPO | Admitting: Physician Assistant

## 2011-05-02 ENCOUNTER — Telehealth: Payer: Self-pay | Admitting: *Deleted

## 2011-05-02 ENCOUNTER — Ambulatory Visit (HOSPITAL_BASED_OUTPATIENT_CLINIC_OR_DEPARTMENT_OTHER): Payer: BC Managed Care – PPO

## 2011-05-02 VITALS — BP 114/72 | HR 78 | Temp 97.6°F | Ht 66.0 in | Wt 131.2 lb

## 2011-05-02 DIAGNOSIS — R5381 Other malaise: Secondary | ICD-10-CM

## 2011-05-02 DIAGNOSIS — C50219 Malignant neoplasm of upper-inner quadrant of unspecified female breast: Secondary | ICD-10-CM

## 2011-05-02 DIAGNOSIS — Z5111 Encounter for antineoplastic chemotherapy: Secondary | ICD-10-CM

## 2011-05-02 DIAGNOSIS — Z170 Estrogen receptor positive status [ER+]: Secondary | ICD-10-CM

## 2011-05-02 DIAGNOSIS — L270 Generalized skin eruption due to drugs and medicaments taken internally: Secondary | ICD-10-CM

## 2011-05-02 LAB — COMPREHENSIVE METABOLIC PANEL
AST: 22 U/L (ref 0–37)
Albumin: 4.4 g/dL (ref 3.5–5.2)
BUN: 25 mg/dL — ABNORMAL HIGH (ref 6–23)
Calcium: 10.7 mg/dL — ABNORMAL HIGH (ref 8.4–10.5)
Chloride: 101 mEq/L (ref 96–112)
Glucose, Bld: 116 mg/dL — ABNORMAL HIGH (ref 70–99)
Potassium: 4.1 mEq/L (ref 3.5–5.3)

## 2011-05-02 LAB — CBC WITH DIFFERENTIAL/PLATELET
Basophils Absolute: 0 10*3/uL (ref 0.0–0.1)
HCT: 36.4 % (ref 34.8–46.6)
HGB: 12.3 g/dL (ref 11.6–15.9)
MONO#: 0.9 10*3/uL (ref 0.1–0.9)
NEUT%: 84 % — ABNORMAL HIGH (ref 38.4–76.8)
WBC: 11.7 10*3/uL — ABNORMAL HIGH (ref 3.9–10.3)
lymph#: 1 10*3/uL (ref 0.9–3.3)

## 2011-05-02 MED ORDER — SODIUM CHLORIDE 0.9 % IV SOLN
600.0000 mg/m2 | Freq: Once | INTRAVENOUS | Status: AC
  Administered 2011-05-02: 1000 mg via INTRAVENOUS
  Filled 2011-05-02: qty 50

## 2011-05-02 MED ORDER — DOCETAXEL CHEMO INJECTION 160 MG/16ML
75.0000 mg/m2 | Freq: Once | INTRAVENOUS | Status: AC
  Administered 2011-05-02: 120 mg via INTRAVENOUS
  Filled 2011-05-02: qty 12

## 2011-05-02 MED ORDER — DOXYCYCLINE HYCLATE 100 MG PO TABS: 100.0000 mg | ORAL_TABLET | Freq: Every day | ORAL | Status: DC

## 2011-05-02 MED ORDER — DEXAMETHASONE SODIUM PHOSPHATE 4 MG/ML IJ SOLN
20.0000 mg | Freq: Once | INTRAMUSCULAR | Status: AC
  Administered 2011-05-02: 20 mg via INTRAVENOUS

## 2011-05-02 MED ORDER — ONDANSETRON 16 MG/50ML IVPB (CHCC)
16.0000 mg | Freq: Once | INTRAVENOUS | Status: AC
  Administered 2011-05-02: 16 mg via INTRAVENOUS

## 2011-05-02 NOTE — Progress Notes (Signed)
ID: Abigail [name]   DOB: 1965/02/13  MR#: [id number]  WUJ#:[id number]  HISTORY OF PRESENT ILLNESS: The patient is a 46 year old [place] woman who had screening mammography at [facility] 02/01/2011, showing calcifications in the left breast. She returned for diagnostic left mammography 02/21/2011, and this showed a 1.1 cm cluster of pleomorphic microcalcifications in the upper inner aspect of the left breast. Biopsy of this area was performed the same day, and showed ([id number]) An invasive ductal carcinoma, grade 2, which was estrogen receptor positive at 89%, progesterone receptor positive at 99%, with an MIB-1 of 30% and no HER-2 amplification. The patient was then set up for bilateral breast MRIs, performed 02/26/2011. There were multiple small cysts in both breasts. In the upper inner quadrant of the left breast there was a 2.7 cm enhancing mass with ill-defined margins. This contained a biopsy clip. There was a hematoma superior to this mass, measuring 2.2 cm. There were no other suspicious areas in either breast and no abnormal appearing lymph nodes.  With this information the patient's case was presented at the multidisciplinary breast cancer conference 02/27/2011, and she was seen in the [facility] clinic the same day.  The patient is being treated in the neoadjuvant setting, the plan being to complete 4 q. three-week doses of docetaxel/cyclophosphamide prior to definitive surgery.   INTERVAL HISTORY: Abigail [name] returns today accompanied by a friend, due for day 1 cycle 3  of 4 planned q. three-week doses of docetaxel/cyclophosphamide being given in the neoadjuvant setting for locally advanced left breast carcinoma. She receives Neulasta on day 2 for granulocyte support.  REVIEW OF SYSTEMS: Abigail [name] is feeling well today, and has only a couple of concerns. 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  Her biggest complaint following her last appointment with the development of  acne which was very uncomfortable beneath the nose and around the mouth. This develops a couple of days of tapering off of the dexamethasone. She's had no additional skin changes or rashes elsewhere. She used some clindamycin cream at home, but this dried her face out, and actually made the rash more painful.  She's had no fevers, chills, or night sweats. No mouth ulcers. No nausea or change in bowel habits other than slight constipation which is manageable with MiraLAX. No signs of numbness or tingling in either the upper or lower extremities. No nailbed changes or nail bed sensitivity.  Detailed review of systems is otherwise noncontributory.  PAST MEDICAL HISTORY: Past Medical History  Diagnosis Date  . Chicken pox as a child  . Allergy     seasonal- spring  . Urticaria   . History of streptococcal pharyngitis   . History of mononucleosis   . Anemia     mild  . Allergic state 12/04/2010  . Preventative health care 12/04/2010  . Anxiety 02/11/2011  . Insomnia 02/11/2011  . PONV (postoperative nausea and vomiting)   . Cancer     Lt breast CA    PAST SURGICAL HISTORY: Past Surgical History  Procedure Date  . Mouth surgery 1997    gum surgery  . Cesarean section 1999    X 1  . Varicose vein surgery      vascular and vein  . Portacath placement 03/20/2011    Procedure: INSERTION PORT-A-CATH;  Surgeon: Robyne Askew, MD;  Location: [facility];  Service: General;  Laterality: Right;  placement of port -Right Subclavian Vein    FAMILY HISTORY  Family History  Problem Relation Age of Onset  . Adopted: Yes    Gynecologic history: She had menarche age 89, is still having regular periods, most recently 02/23/2011. She used birth control pills for approximately 10 years, with no clotting or other complications. She is GX P2. Age at first live birth was 19.   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 Her husband  [name] is in accounting. Son [name] (pronounced with a hard "g") is 15 and in the ninth grade, daughter [name] is 13-1/7 grade.   ADVANCED DIRECTIVES: Not in place  HEALTH MAINTENANCE: History  Substance Use Topics  . Smoking status: Never Smoker   . Smokeless tobacco: Never Used  . Alcohol Use: 0.6 oz/week    1 Glasses of wine per week     occasionally- social     Colonoscopy:  PAP:  Bone density:  Lipid panel:  Allergies  Allergen Reactions  . Penicillins Rash    Current Outpatient Prescriptions  Medication Sig Dispense Refill  . Calcium Carbonate-Vitamin D (CALCIUM + D PO) Take 1 tablet by mouth 2 (two) times daily.        Marland Kitchen dexamethasone (DECADRON) 4 MG tablet Take 2 tablets two times a day the day before Taxotere. Then take 2 tabs two times a day starting the day after chemo for 3 days.  30 tablet  1  . diazepam (VALIUM) 5 MG tablet Take 1/2 tablet to 2 tablets daily as needed  20 tablet  0  . doxycycline (VIBRA-TABS) 100 MG tablet Take 1 tablet (100 mg total) by mouth daily.  30 tablet  1  . HYDROcodone-acetaminophen (NORCO) 5-325 MG per tablet       . lidocaine-prilocaine (EMLA) cream Apply as directed to port site 1 hour before tx  30 g  1  . LORazepam (ATIVAN) 0.5 MG tablet Take 1 tablet (0.5 mg total) by mouth every 6 (six) hours as needed (Nausea or vomiting).  30 tablet  0  . Melatonin 3 MG TABS Take 1 tablet by mouth at bedtime as needed.      . Multiple Vitamin (MULTIVITAMIN) tablet Take 1 tablet by mouth daily.        Marland Kitchen omeprazole (PRILOSEC) 20 MG capsule Take 1 capsule (20 mg total) by mouth 2 (two) times daily.  60 capsule  3  . ondansetron (ZOFRAN) 8 MG tablet Take 1 tablet two times a day starting the day after chemo for 3 days. Then take 1 tab two times a day as needed for nausea or vomiting.  30 tablet  1  . prochlorperazine (COMPAZINE) 10 MG tablet Take 1 tablet (10 mg total) by mouth every 6 (six) hours as needed (Nausea or vomiting).  30 tablet  1  .  tobramycin-dexamethasone (TOBRADEX) ophthalmic solution       . zolpidem (AMBIEN) 10 MG tablet Take 1 tablet (10 mg total) by mouth at bedtime as needed for sleep. Needs Ambien, did not respond to generic Zolpidem  30 tablet  1  . zolpidem (AMBIEN) 10 MG tablet Take 5 mg by mouth at bedtime as needed. Takes 1/2 of 10 mg tablet as needed related to shift work.        OBJECTIVE: Filed Vitals:   05/02/11 1030  BP: 114/72  Pulse: 78  Temp: 97.6 F (36.4 C)     Body mass index is 21.18 kg/(m^2).    ECOG FS: 0  Physical Exam: HEENT:  Sclerae anicteric, conjunctivae pink.  Oropharynx clear.  No mucositis or candidiasis.   Nodes:  No cervical, supraclavicular, or axillary lymphadenopathy palpated.  Breast Exam:  Left breast, unable to palpate a distinct mass. There is a palpable seroma just beneath the biopsy site. No obvious skin changes and no nipple inversion. Port is intact in the right upper chest wall with no erythema, edema, or evidence of infection. Lungs:  Clear to auscultation bilaterally.  No crackles, rhonchi, or wheezes.   Heart:  Regular rate and rhythm.  No gallops, murmurs, or rubs. Abdomen:  Soft, nontender.  Positive bowel sounds.  No organomegaly or masses palpated.   Musculoskeletal:  No focal spinal tenderness to palpation.  Extremities:  Benign.  No peripheral edema or cyanosis.   Skin:  Evidence of acne around the mouth, with pustules resolving.  Neuro:  Nonfocal. Alert and oriented x3.  LAB RESULTS: Lab Results  Component Value Date   WBC 11.7* 05/02/2011   NEUTROABS 9.9* 05/02/2011   HGB 12.3 05/02/2011   HCT 36.4 05/02/2011   MCV 93.6 05/02/2011   PLT 323 05/02/2011      Chemistry      Component Value Date/Time   NA 138 04/11/2011 1131   K 3.8 04/11/2011 1131   CL 102 04/11/2011 1131   CO2 27 04/11/2011 1131   BUN 25* 04/11/2011 1131   CREATININE 0.56 04/11/2011 1131      Component Value Date/Time   CALCIUM 10.1 04/11/2011 1131   ALKPHOS 102 04/11/2011 1131   AST 23  04/11/2011 1131   ALT 39* 04/11/2011 1131   BILITOT 0.3 04/11/2011 1131       Lab Results  Component Value Date   LABCA2 23 02/27/2011     STUDIES: No recent studies.   ASSESSMENT: 46 year old [place] woman  (1) status post left breast biopsy 10 or 24th 2013, for a clinical T2 N0, stage IIA invasive ductal carcinoma, grade 2, strongly estrogen and progesterone receptor positive, HER-2 not amplified, with an MIB-1 of 30%.  (2) being treated in the neoadjuvant setting, the plan being to complete 4 doses of docetaxel/cyclophosphamide, given every 3 weeks.   PLAN: Abigail [name] will proceed to treatment today as scheduled for her third dose of docetaxel/cyclophosphamide and will return tomorrow for Neulasta as scheduled. I will see her again in one week for assessment of chemotoxicity. She is already scheduled out through her fourth and final dose of chemotherapy in late April after which we will repeat an MRI, and she'll see both Dr. Carolynne Edouard and Dr. Darnelle Catalan to discuss definitive surgery.  I am starting sure all on doxycycline 100 mg daily for the acne she develops associated with the steroids. She understands how to take this medication appropriately, and also understands that it will make her more sensitive to sunlight.  Patient voices understanding and agreement with our plan, and as always, knows to call with any changes or problems.Deondrae Mcgrail    05/02/2011

## 2011-05-02 NOTE — Telephone Encounter (Signed)
corrections for the earlier notes patient will see dr. Carolynne Edouard on 06-06-2011 at 4:00pm

## 2011-05-02 NOTE — Telephone Encounter (Signed)
gave patient appointment for dr.toth on 05-07-2011 arrival 4:00 pm printed out calendar and gave to the patient

## 2011-05-03 ENCOUNTER — Ambulatory Visit (HOSPITAL_BASED_OUTPATIENT_CLINIC_OR_DEPARTMENT_OTHER): Payer: BC Managed Care – PPO

## 2011-05-03 VITALS — BP 112/62 | HR 75 | Temp 97.1°F

## 2011-05-03 DIAGNOSIS — C50219 Malignant neoplasm of upper-inner quadrant of unspecified female breast: Secondary | ICD-10-CM

## 2011-05-03 DIAGNOSIS — Z5189 Encounter for other specified aftercare: Secondary | ICD-10-CM

## 2011-05-03 MED ORDER — PEGFILGRASTIM INJECTION 6 MG/0.6ML
6.0000 mg | Freq: Once | SUBCUTANEOUS | Status: AC
  Administered 2011-05-03: 6 mg via SUBCUTANEOUS
  Filled 2011-05-03: qty 0.6

## 2011-05-09 ENCOUNTER — Other Ambulatory Visit (HOSPITAL_BASED_OUTPATIENT_CLINIC_OR_DEPARTMENT_OTHER): Payer: BC Managed Care – PPO | Admitting: Lab

## 2011-05-09 ENCOUNTER — Ambulatory Visit (HOSPITAL_BASED_OUTPATIENT_CLINIC_OR_DEPARTMENT_OTHER): Payer: BC Managed Care – PPO | Admitting: Physician Assistant

## 2011-05-09 ENCOUNTER — Encounter: Payer: Self-pay | Admitting: Physician Assistant

## 2011-05-09 VITALS — BP 102/71 | HR 102 | Temp 98.7°F | Ht 66.0 in | Wt 133.8 lb

## 2011-05-09 DIAGNOSIS — C50219 Malignant neoplasm of upper-inner quadrant of unspecified female breast: Secondary | ICD-10-CM

## 2011-05-09 DIAGNOSIS — Z170 Estrogen receptor positive status [ER+]: Secondary | ICD-10-CM

## 2011-05-09 LAB — CBC WITH DIFFERENTIAL/PLATELET
BASO%: 0.4 % (ref 0.0–2.0)
EOS%: 0.1 % (ref 0.0–7.0)
Eosinophils Absolute: 0 10*3/uL (ref 0.0–0.5)
MCH: 31 pg (ref 25.1–34.0)
MCV: 95.5 fL (ref 79.5–101.0)
MONO%: 9.1 % (ref 0.0–14.0)
NEUT#: 14.2 10*3/uL — ABNORMAL HIGH (ref 1.5–6.5)
RBC: 3.7 10*6/uL (ref 3.70–5.45)
RDW: 13.6 % (ref 11.2–14.5)
nRBC: 0 % (ref 0–0)

## 2011-05-09 MED ORDER — DEXAMETHASONE 4 MG PO TABS: ORAL_TABLET | ORAL | Status: DC

## 2011-05-09 NOTE — Progress Notes (Signed)
ID: Abigail [name]   DOB: 21-Aug-1965  MR#: [id number]  VHQ#:[id number]  HISTORY OF PRESENT ILLNESS: The patient is a 46 year old [place] woman who had screening mammography at [facility] 02/01/2011, showing calcifications in the left breast. She returned for diagnostic left mammography 02/21/2011, and this showed a 1.1 cm cluster of pleomorphic microcalcifications in the upper inner aspect of the left breast. Biopsy of this area was performed the same day, and showed ([id number]) An invasive ductal carcinoma, grade 2, which was estrogen receptor positive at 89%, progesterone receptor positive at 99%, with an MIB-1 of 30% and no HER-2 amplification. The patient was then set up for bilateral breast MRIs, performed 02/26/2011. There were multiple small cysts in both breasts. In the upper inner quadrant of the left breast there was a 2.7 cm enhancing mass with ill-defined margins. This contained a biopsy clip. There was a hematoma superior to this mass, measuring 2.2 cm. There were no other suspicious areas in either breast and no abnormal appearing lymph nodes.  With this information the patient's case was presented at the multidisciplinary breast cancer conference 02/27/2011, and she was seen in the [facility] clinic the same day.  The patient is being treated in the neoadjuvant setting, the plan being to complete 4 q. three-week doses of docetaxel/cyclophosphamide prior to definitive surgery.   INTERVAL HISTORY: Abigail [name] returns today for assessment of chemotoxicity on day 8 cycle 3  of 4 planned q. three-week doses of docetaxel/cyclophosphamide being given in the neoadjuvant setting for locally advanced left breast carcinoma. She receives Neulasta on day 2 for granulocyte support. Interval history is unremarkable, and the patient continues to tolerate treatment well.  REVIEW OF SYSTEMS: Abigail [name] is feeling well today. Her energy level is a little better today. No fevers, chills, or night sweats. She's had  no signs of peripheral neuropathy. No skin changes, rashes, or nail bed changes. She started on her oral dexamethasone for the acne associated with the steroid, and the rash has not recurred. Abigail [name] has had no nausea or emesis, and no change in bowel habits.  No cough or shortness of breath. No abnormal headaches or pain elsewhere.  A detailed review of systems is otherwise noncontributory.  PAST MEDICAL HISTORY: Past Medical History  Diagnosis Date  . Chicken pox as a child  . Allergy     seasonal- spring  . Urticaria   . History of streptococcal pharyngitis   . History of mononucleosis   . Anemia     mild  . Allergic state 12/04/2010  . Preventative health care 12/04/2010  . Anxiety 02/11/2011  . Insomnia 02/11/2011  . PONV (postoperative nausea and vomiting)   . Cancer     Lt breast CA    PAST SURGICAL HISTORY: Past Surgical History  Procedure Date  . Mouth surgery 1997    gum surgery  . Cesarean section 1999    X 1  . Varicose vein surgery      vascular and vein  . Portacath placement 03/20/2011    Procedure: INSERTION PORT-A-CATH;  Surgeon: Robyne Askew, MD;  Location: [facility];  Service: General;  Laterality: Right;  placement of port -Right Subclavian Vein    FAMILY HISTORY Family History  Problem Relation Age of Onset  . Adopted: Yes    Gynecologic history: She had menarche age 85, is still having regular periods, most recently 02/23/2011. She used birth control pills for approximately 10 years, with no clotting or other complications. She is  GX P2. Age at first live birth was 8.   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 Her husband Abigail [name] is in accounting. Son Abigail [name] (pronounced with a hard "g") is 15 and in the ninth grade, daughter Abigail [name] is 13-1/7 grade.   ADVANCED DIRECTIVES: Not in place  HEALTH MAINTENANCE: History  Substance Use Topics  . Smoking status: Never Smoker   .  Smokeless tobacco: Never Used  . Alcohol Use: 0.6 oz/week    1 Glasses of wine per week     occasionally- social     Colonoscopy:  PAP:  Bone density:  Lipid panel:  Allergies  Allergen Reactions  . Penicillins Rash    Current Outpatient Prescriptions  Medication Sig Dispense Refill  . Calcium Carbonate-Vitamin D (CALCIUM + D PO) Take 1 tablet by mouth 2 (two) times daily.        Marland Kitchen dexamethasone (DECADRON) 4 MG tablet Take 2 tablets two times a day the day before Taxotere. Then take 2 tabs two times a day starting the day after chemo for 3 days.  10 tablet  0  . diazepam (VALIUM) 5 MG tablet Take 1/2 tablet to 2 tablets daily as needed  20 tablet  0  . doxycycline (VIBRA-TABS) 100 MG tablet Take 1 tablet (100 mg total) by mouth daily.  30 tablet  1  . HYDROcodone-acetaminophen (NORCO) 5-325 MG per tablet       . lidocaine-prilocaine (EMLA) cream Apply as directed to port site 1 hour before tx  30 g  1  . LORazepam (ATIVAN) 0.5 MG tablet Take 1 tablet (0.5 mg total) by mouth every 6 (six) hours as needed (Nausea or vomiting).  30 tablet  0  . Melatonin 3 MG TABS Take 1 tablet by mouth at bedtime as needed.      . Multiple Vitamin (MULTIVITAMIN) tablet Take 1 tablet by mouth daily.        Marland Kitchen omeprazole (PRILOSEC) 20 MG capsule Take 1 capsule (20 mg total) by mouth 2 (two) times daily.  60 capsule  3  . ondansetron (ZOFRAN) 8 MG tablet Take 1 tablet two times a day starting the day after chemo for 3 days. Then take 1 tab two times a day as needed for nausea or vomiting.  30 tablet  1  . prochlorperazine (COMPAZINE) 10 MG tablet Take 1 tablet (10 mg total) by mouth every 6 (six) hours as needed (Nausea or vomiting).  30 tablet  1  . tobramycin-dexamethasone (TOBRADEX) ophthalmic solution       . zolpidem (AMBIEN) 10 MG tablet Take 1 tablet (10 mg total) by mouth at bedtime as needed for sleep. Needs Ambien, did not respond to generic Zolpidem  30 tablet  1  . zolpidem (AMBIEN) 10 MG tablet  Take 5 mg by mouth at bedtime as needed. Takes 1/2 of 10 mg tablet as needed related to shift work.        OBJECTIVE: Filed Vitals:   05/09/11 1201  BP: 102/71  Pulse: 102  Temp: 98.7 F (37.1 C)     Body mass index is 21.60 kg/(m^2).    ECOG FS: 0  Physical Exam: HEENT:  Sclerae anicteric, conjunctivae pink.  Oropharynx clear.  No mucositis or candidiasis.   Nodes:  No cervical, supraclavicular, or axillary lymphadenopathy palpated.  Breast Exam:  Deferred Lungs:  Clear to auscultation bilaterally.  No crackles, rhonchi, or wheezes.   Heart:  Regular rate and  rhythm.  No gallops, murmurs, or rubs. Abdomen:  Soft, thin, nontender.  Positive bowel sounds.  No organomegaly or masses palpated.   Musculoskeletal:  No focal spinal tenderness to palpation.  Extremities:  Benign.  No peripheral edema or cyanosis.   Skin:  Benign Neuro:  Nonfocal. Alert and oriented x3.  LAB RESULTS: Lab Results  Component Value Date   WBC 18.2* 05/09/2011   NEUTROABS 14.2* 05/09/2011   HGB 11.5* 05/09/2011   HCT 35.4 05/09/2011   MCV 95.5 05/09/2011   PLT 261 05/09/2011      Chemistry      Component Value Date/Time   NA 139 05/02/2011 1021   K 4.1 05/02/2011 1021   CL 101 05/02/2011 1021   CO2 26 05/02/2011 1021   BUN 25* 05/02/2011 1021   CREATININE 0.65 05/02/2011 1021      Component Value Date/Time   CALCIUM 10.7* 05/02/2011 1021   ALKPHOS 106 05/02/2011 1021   AST 22 05/02/2011 1021   ALT 56* 05/02/2011 1021   BILITOT 0.4 05/02/2011 1021       Lab Results  Component Value Date   LABCA2 23 02/27/2011     STUDIES: No recent studies.   ASSESSMENT: 46 year old [place] woman  (1) status post left breast biopsy 10 or 24th 2013, for a clinical T2 N0, stage IIA invasive ductal carcinoma, grade 2, strongly estrogen and progesterone receptor positive, HER-2 not amplified, with an MIB-1 of 30%.  (2) being treated in the neoadjuvant setting, the plan being to complete 4 doses of docetaxel/cyclophosphamide,  given every 3 weeks.   PLAN: Abigail [name] continues to tolerate her neoadjuvant chemotherapy well. She'll return in 2 weeks for her fourth and final dose of docetaxel/cyclophosphamide on April 25. She is already scheduled for repeat breast MRI followed by appointments with Dr. Darnelle Catalan and Dr. Carolynne Edouard in early May to discuss her upcoming surgery.  Jezelle voices understanding and agreement with this plan, and will call with any changes or problems.  Adonte Vanriper    05/09/2011

## 2011-05-17 ENCOUNTER — Encounter: Payer: Self-pay | Admitting: Physician Assistant

## 2011-05-17 ENCOUNTER — Other Ambulatory Visit: Payer: Self-pay | Admitting: Physician Assistant

## 2011-05-23 ENCOUNTER — Ambulatory Visit: Payer: BC Managed Care – PPO

## 2011-05-23 ENCOUNTER — Other Ambulatory Visit (HOSPITAL_BASED_OUTPATIENT_CLINIC_OR_DEPARTMENT_OTHER): Payer: BC Managed Care – PPO | Admitting: Lab

## 2011-05-23 ENCOUNTER — Ambulatory Visit (HOSPITAL_BASED_OUTPATIENT_CLINIC_OR_DEPARTMENT_OTHER): Payer: BC Managed Care – PPO

## 2011-05-23 ENCOUNTER — Ambulatory Visit (HOSPITAL_BASED_OUTPATIENT_CLINIC_OR_DEPARTMENT_OTHER): Payer: BC Managed Care – PPO | Admitting: Physician Assistant

## 2011-05-23 ENCOUNTER — Encounter: Payer: Self-pay | Admitting: Physician Assistant

## 2011-05-23 VITALS — BP 109/70 | HR 92 | Temp 98.0°F | Ht 66.0 in | Wt 135.0 lb

## 2011-05-23 DIAGNOSIS — C50219 Malignant neoplasm of upper-inner quadrant of unspecified female breast: Secondary | ICD-10-CM

## 2011-05-23 DIAGNOSIS — Z5111 Encounter for antineoplastic chemotherapy: Secondary | ICD-10-CM

## 2011-05-23 DIAGNOSIS — Z170 Estrogen receptor positive status [ER+]: Secondary | ICD-10-CM

## 2011-05-23 DIAGNOSIS — Z9221 Personal history of antineoplastic chemotherapy: Secondary | ICD-10-CM

## 2011-05-23 HISTORY — DX: Personal history of antineoplastic chemotherapy: Z92.21

## 2011-05-23 LAB — COMPREHENSIVE METABOLIC PANEL
ALT: 44 U/L — ABNORMAL HIGH (ref 0–35)
AST: 22 U/L (ref 0–37)
Albumin: 4.1 g/dL (ref 3.5–5.2)
Calcium: 10.2 mg/dL (ref 8.4–10.5)
Chloride: 102 mEq/L (ref 96–112)
Creatinine, Ser: 0.56 mg/dL (ref 0.50–1.10)
Potassium: 3.7 mEq/L (ref 3.5–5.3)

## 2011-05-23 LAB — CBC WITH DIFFERENTIAL/PLATELET
BASO%: 0.1 % (ref 0.0–2.0)
MCHC: 33.6 g/dL (ref 31.5–36.0)
MONO#: 0.5 10*3/uL (ref 0.1–0.9)
RBC: 3.62 10*6/uL — ABNORMAL LOW (ref 3.70–5.45)
WBC: 12.2 10*3/uL — ABNORMAL HIGH (ref 3.9–10.3)
lymph#: 0.8 10*3/uL — ABNORMAL LOW (ref 0.9–3.3)
nRBC: 0 % (ref 0–0)

## 2011-05-23 MED ORDER — HEPARIN SOD (PORK) LOCK FLUSH 100 UNIT/ML IV SOLN
500.0000 [IU] | Freq: Once | INTRAVENOUS | Status: AC | PRN
  Administered 2011-05-23: 500 [IU]
  Filled 2011-05-23: qty 5

## 2011-05-23 MED ORDER — DOCETAXEL CHEMO INJECTION 160 MG/16ML
75.0000 mg/m2 | Freq: Once | INTRAVENOUS | Status: AC
  Administered 2011-05-23: 120 mg via INTRAVENOUS
  Filled 2011-05-23: qty 12

## 2011-05-23 MED ORDER — DEXAMETHASONE SODIUM PHOSPHATE 4 MG/ML IJ SOLN
20.0000 mg | Freq: Once | INTRAMUSCULAR | Status: AC
  Administered 2011-05-23: 20 mg via INTRAVENOUS

## 2011-05-23 MED ORDER — DOXYCYCLINE HYCLATE 100 MG PO TABS: 100.0000 mg | ORAL_TABLET | Freq: Every day | ORAL | Status: DC

## 2011-05-23 MED ORDER — SODIUM CHLORIDE 0.9 % IJ SOLN
10.0000 mL | INTRAMUSCULAR | Status: DC | PRN
  Administered 2011-05-23: 10 mL
  Filled 2011-05-23: qty 10

## 2011-05-23 MED ORDER — SODIUM CHLORIDE 0.9 % IV SOLN
Freq: Once | INTRAVENOUS | Status: AC
  Administered 2011-05-23: 12:00:00 via INTRAVENOUS

## 2011-05-23 MED ORDER — SODIUM CHLORIDE 0.9 % IV SOLN
600.0000 mg/m2 | Freq: Once | INTRAVENOUS | Status: AC
  Administered 2011-05-23: 1000 mg via INTRAVENOUS
  Filled 2011-05-23: qty 50

## 2011-05-23 MED ORDER — ONDANSETRON 16 MG/50ML IVPB (CHCC)
16.0000 mg | Freq: Once | INTRAVENOUS | Status: AC
  Administered 2011-05-23: 16 mg via INTRAVENOUS

## 2011-05-23 NOTE — Progress Notes (Signed)
ID: Abigail [name]   DOB: [birth year]  MR#: [id number]  CSN#:[id number]  HISTORY OF PRESENT ILLNESS: The patient is a 46 year old [place] woman who had screening mammography at [facility] 02/01/2011, showing calcifications in the left breast. She returned for diagnostic left mammography 02/21/2011, and this showed a 1.1 cm cluster of pleomorphic microcalcifications in the upper inner aspect of the left breast. Biopsy of this area was performed the same day, and showed ([id number]) An invasive ductal carcinoma, grade 2, which was estrogen receptor positive at 89%, progesterone receptor positive at 99%, with an MIB-1 of 30% and no HER-2 amplification. The patient was then set up for bilateral breast MRIs, performed 02/26/2011. There were multiple small cysts in both breasts. In the upper inner quadrant of the left breast there was a 2.7 cm enhancing mass with ill-defined margins. This contained a biopsy clip. There was a hematoma superior to this mass, measuring 2.2 cm. There were no other suspicious areas in either breast and no abnormal appearing lymph nodes.  With this information the patient's case was presented at the multidisciplinary breast cancer conference 02/27/2011, and she was seen in the [facility] clinic the same day.  The patient is being treated in the neoadjuvant setting, the plan being to complete 4 q. three-week doses of docetaxel/cyclophosphamide prior to definitive surgery.   INTERVAL HISTORY: Abigail [name] returns today in anticipation of day 1 cycle 4 of 4 planned q. three-week doses of docetaxel/cyclophosphamide being given in the neoadjuvant setting for locally advanced left breast carcinoma. She receives Neulasta on day 2 for granulocyte support. Interval history is generally unremarkable, and the patient continues to tolerate treatment well. She continues to work as a Engineer, civil (consulting) in the NICU, but is thinking she needs a little time off to recuperate. She has had increased fatigue over the last  couple weeks, going into this fourth and final cycle of chemotherapy.  REVIEW OF SYSTEMS: With the exception of fatigue as noted above, Abigail [name] is feeling well. No fevers, chills, or night sweats. She's had no signs of peripheral neuropathy. No skin changes, rashes, or nail bed changes. She started on her oral doxycycline for the acne associated with the steroid, and the rash has not recurred. Abigail [name] has had no nausea or emesis, and no change in bowel habits.  No cough or shortness of breath. No abnormal headaches or pain elsewhere.  A detailed review of systems is otherwise noncontributory.  PAST MEDICAL HISTORY: Past Medical History  Diagnosis Date  . Chicken pox as a child  . Allergy     seasonal- spring  . Urticaria   . History of streptococcal pharyngitis   . History of mononucleosis   . Anemia     mild  . Allergic state 12/04/2010  . Preventative health care 12/04/2010  . Anxiety 02/11/2011  . Insomnia 02/11/2011  . PONV (postoperative nausea and vomiting)   . Cancer     Lt breast CA    PAST SURGICAL HISTORY: Past Surgical History  Procedure Date  . Mouth surgery 1997    gum surgery  . Cesarean section 1999    X 1  . Varicose vein surgery     [place] vascular and vein  . Portacath placement 03/20/2011    Procedure: INSERTION PORT-A-CATH;  Surgeon: Robyne Askew, MD;  Location: [facility];  Service: General;  Laterality: Right;  placement of port -Right Subclavian Vein    FAMILY HISTORY Family History  Problem Relation Age of Onset  .  Adopted: Yes    Gynecologic history: She had menarche age 39, is still having regular periods, most recently 02/23/2011. She used birth control pills for approximately 10 years, with no clotting or other complications. She is GX P2. Age at first live birth was 50.   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 Her husband Abigail [name] is in accounting. Son Abigail [name] (pronounced with a hard  "g") is 15 and in the ninth grade, daughter Abigail [name] is 13-1/7 grade.   ADVANCED DIRECTIVES: Not in place  HEALTH MAINTENANCE: History  Substance Use Topics  . Smoking status: Never Smoker   . Smokeless tobacco: Never Used  . Alcohol Use: 0.6 oz/week    1 Glasses of wine per week     occasionally- social     Colonoscopy:  PAP:  Bone density:  Lipid panel:  Allergies  Allergen Reactions  . Penicillins Rash    Current Outpatient Prescriptions  Medication Sig Dispense Refill  . Calcium Carbonate-Vitamin D (CALCIUM + D PO) Take 1 tablet by mouth 2 (two) times daily.        Marland Kitchen dexamethasone (DECADRON) 4 MG tablet Take 2 tablets two times a day the day before Taxotere. Then take 2 tabs two times a day starting the day after chemo for 3 days.  10 tablet  0  . diazepam (VALIUM) 5 MG tablet Take 1/2 tablet to 2 tablets daily as needed  20 tablet  0  . doxycycline (VIBRA-TABS) 100 MG tablet Take 1 tablet (100 mg total) by mouth daily.  30 tablet  1  . HYDROcodone-acetaminophen (NORCO) 5-325 MG per tablet       . lidocaine-prilocaine (EMLA) cream Apply as directed to port site 1 hour before tx  30 g  1  . LORazepam (ATIVAN) 0.5 MG tablet Take 1 tablet (0.5 mg total) by mouth every 6 (six) hours as needed (Nausea or vomiting).  30 tablet  0  . Melatonin 3 MG TABS Take 1 tablet by mouth at bedtime as needed.      . Multiple Vitamin (MULTIVITAMIN) tablet Take 1 tablet by mouth daily.        Marland Kitchen omeprazole (PRILOSEC) 20 MG capsule Take 1 capsule (20 mg total) by mouth 2 (two) times daily.  60 capsule  3  . ondansetron (ZOFRAN) 8 MG tablet Take 1 tablet two times a day starting the day after chemo for 3 days. Then take 1 tab two times a day as needed for nausea or vomiting.  30 tablet  1  . prochlorperazine (COMPAZINE) 10 MG tablet Take 1 tablet (10 mg total) by mouth every 6 (six) hours as needed (Nausea or vomiting).  30 tablet  1  . tobramycin-dexamethasone (TOBRADEX) ophthalmic solution       .  zolpidem (AMBIEN) 10 MG tablet Take 1 tablet (10 mg total) by mouth at bedtime as needed for sleep. Needs Ambien, did not respond to generic Zolpidem  30 tablet  1  . zolpidem (AMBIEN) 10 MG tablet Take 5 mg by mouth at bedtime as needed. Takes 1/2 of 10 mg tablet as needed related to shift work.        OBJECTIVE: Filed Vitals:   05/23/11 1041  BP: 109/70  Pulse: 92  Temp: 98 F (36.7 C)     Body mass index is 21.79 kg/(m^2).    ECOG FS: 0  Physical Exam: HEENT:  Sclerae anicteric, conjunctivae pink.  Oropharynx clear.  No mucositis  or candidiasis.   Nodes:  No cervical, supraclavicular, or axillary lymphadenopathy palpated.  Breast Exam:  Deferred Lungs:  Clear to auscultation bilaterally.  No crackles, rhonchi, or wheezes.   Heart:  Regular rate and rhythm.  No gallops, murmurs, or rubs. Abdomen:  Soft, thin, nontender.  Positive bowel sounds.  No organomegaly or masses palpated.   Musculoskeletal:  No focal spinal tenderness to palpation.  Extremities:  Benign.  No peripheral edema or cyanosis.   Skin:  Benign, no obvious rashes, specifically no acne noted on the face. Neuro:  Nonfocal. Alert and oriented x3 with normal affect  LAB RESULTS: Lab Results  Component Value Date   WBC 12.2* 05/23/2011   NEUTROABS 10.9* 05/23/2011   HGB 11.5* 05/23/2011   HCT 34.2* 05/23/2011   MCV 94.5 05/23/2011   PLT 374 05/23/2011      Chemistry      Component Value Date/Time   NA 139 05/02/2011 1021   K 4.1 05/02/2011 1021   CL 101 05/02/2011 1021   CO2 26 05/02/2011 1021   BUN 25* 05/02/2011 1021   CREATININE 0.65 05/02/2011 1021      Component Value Date/Time   CALCIUM 10.7* 05/02/2011 1021   ALKPHOS 106 05/02/2011 1021   AST 22 05/02/2011 1021   ALT 56* 05/02/2011 1021   BILITOT 0.4 05/02/2011 1021       Lab Results  Component Value Date   LABCA2 23 02/27/2011     STUDIES: No recent studies. The patient is scheduled for repeat breast MRI next week on April 30.  ASSESSMENT: 46 year old [place]  (1) status post left breast biopsy 10 or 24th 2013, for a clinical T2 N0, stage IIA invasive ductal carcinoma, grade 2, strongly estrogen and progesterone receptor positive, HER-2 not amplified, with an MIB-1 of 30%.  (2) being treated in the neoadjuvant setting, the plan being to complete 4 doses of docetaxel/cyclophosphamide, given every 3 weeks.   PLAN: Abigail [name] will proceed to treatment today as scheduled for her fourth and final dose of neoadjuvant chemotherapy. She will receive Neulasta tomorrow. She scheduled for breast MRI next week, after which she will be seeing both Dr. Darnelle Catalan and Dr. Carolynne Edouard to discuss upcoming surgery.  Abigail [name] voices understanding and agreement with this plan, and will call with any changes or problems.  Jaece Ducharme    05/23/2011

## 2011-05-24 ENCOUNTER — Ambulatory Visit (HOSPITAL_BASED_OUTPATIENT_CLINIC_OR_DEPARTMENT_OTHER): Payer: BC Managed Care – PPO

## 2011-05-24 VITALS — BP 99/66 | HR 79 | Temp 97.8°F

## 2011-05-24 DIAGNOSIS — C50219 Malignant neoplasm of upper-inner quadrant of unspecified female breast: Secondary | ICD-10-CM

## 2011-05-24 DIAGNOSIS — Z5189 Encounter for other specified aftercare: Secondary | ICD-10-CM

## 2011-05-24 MED ORDER — PEGFILGRASTIM INJECTION 6 MG/0.6ML
6.0000 mg | Freq: Once | SUBCUTANEOUS | Status: AC
Start: 2011-05-24 — End: 2011-05-24
  Administered 2011-05-24: 6 mg via SUBCUTANEOUS
  Filled 2011-05-24: qty 0.6

## 2011-05-28 ENCOUNTER — Ambulatory Visit
Admission: RE | Admit: 2011-05-28 | Discharge: 2011-05-28 | Disposition: A | Discharge status: Home / Self Care | Payer: BC Managed Care – PPO | Source: Ambulatory Visit | Attending: Physician Assistant | Admitting: Physician Assistant

## 2011-05-28 DIAGNOSIS — C50219 Malignant neoplasm of upper-inner quadrant of unspecified female breast: Secondary | ICD-10-CM

## 2011-05-28 MED ORDER — GADOBENATE DIMEGLUMINE 529 MG/ML IV SOLN
12.0000 mL | Freq: Once | INTRAVENOUS | Status: AC | PRN
  Administered 2011-05-28: 12 mL via INTRAVENOUS

## 2011-05-30 ENCOUNTER — Ambulatory Visit (HOSPITAL_BASED_OUTPATIENT_CLINIC_OR_DEPARTMENT_OTHER): Payer: BC Managed Care – PPO | Admitting: Oncology

## 2011-05-30 ENCOUNTER — Telehealth (INDEPENDENT_AMBULATORY_CARE_PROVIDER_SITE_OTHER): Payer: Self-pay

## 2011-05-30 ENCOUNTER — Other Ambulatory Visit (INDEPENDENT_AMBULATORY_CARE_PROVIDER_SITE_OTHER): Payer: Self-pay | Admitting: General Surgery

## 2011-05-30 ENCOUNTER — Other Ambulatory Visit (HOSPITAL_BASED_OUTPATIENT_CLINIC_OR_DEPARTMENT_OTHER): Payer: BC Managed Care – PPO | Admitting: Lab

## 2011-05-30 ENCOUNTER — Telehealth: Payer: Self-pay | Admitting: *Deleted

## 2011-05-30 VITALS — BP 103/68 | HR 88 | Temp 98.1°F | Ht 66.0 in | Wt 138.1 lb

## 2011-05-30 DIAGNOSIS — C50219 Malignant neoplasm of upper-inner quadrant of unspecified female breast: Secondary | ICD-10-CM

## 2011-05-30 DIAGNOSIS — R928 Other abnormal and inconclusive findings on diagnostic imaging of breast: Secondary | ICD-10-CM

## 2011-05-30 DIAGNOSIS — Z170 Estrogen receptor positive status [ER+]: Secondary | ICD-10-CM

## 2011-05-30 LAB — MANUAL DIFFERENTIAL
ANC (CHCC manual diff): 17.1 10*3/uL — ABNORMAL HIGH (ref 1.5–6.5)
Basophil: 1 % (ref 0–2)
Blasts: 0 % (ref 0–0)
LYMPH: 14 % (ref 14–49)
Metamyelocytes: 7 % — ABNORMAL HIGH (ref 0–0)
Myelocytes: 6 % — ABNORMAL HIGH (ref 0–0)
PLT EST: ADEQUATE

## 2011-05-30 LAB — CBC WITH DIFFERENTIAL/PLATELET
HCT: 33 % — ABNORMAL LOW (ref 34.8–46.6)
MCH: 31.2 pg (ref 25.1–34.0)
MCHC: 32.7 g/dL (ref 31.5–36.0)
RBC: 3.46 10*6/uL — ABNORMAL LOW (ref 3.70–5.45)
RDW: 13.5 % (ref 11.2–14.5)

## 2011-05-30 NOTE — Progress Notes (Signed)
ID: Abigail [name]   DOB: 10-22-1965  MR#: [id number]  CSN#:[id number]  HISTORY OF PRESENT ILLNESS: The patient is a 46 year old [place] woman who had screening mammography at [facility] 02/01/2011, showing calcifications in the left breast. She returned for diagnostic left mammography 02/21/2011, and this showed a 1.1 cm cluster of pleomorphic microcalcifications in the upper inner aspect of the left breast. Biopsy of this area was performed the same day, and showed ([id number]) An invasive ductal carcinoma, grade 2, which was estrogen receptor positive at 89%, progesterone receptor positive at 99%, with an MIB-1 of 30% and no HER-2 amplification. The patient was then set up for bilateral breast MRIs, performed 02/26/2011. There were multiple small cysts in both breasts. In the upper inner quadrant of the left breast there was a 2.7 cm enhancing mass with ill-defined margins. This contained a biopsy clip. There was a hematoma superior to this mass, measuring 2.2 cm. There were no other suspicious areas in either breast and no abnormal appearing lymph nodes.  With this information the patient's case was presented at the multidisciplinary breast cancer conference 02/27/2011, and she was seen in the [facility] clinic the same day. A decision was made to proceed with neoadjuvant treatment. Her subsequent history is as detailed below.  INTERVAL HISTORY: Abigail [name] returns today with her husband Abigail [name] for followup of her breast cancer. She completed her chemotherapy last week. She really has an appointment with Dr. Carolynne Edouard next week at to discuss her surgical options.  REVIEW OF SYSTEMS: She has picked up a little bit of swelling with this last cycle, gaining about 8 pounds, which is about 3 quarts of fluid. 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 This resolves without intervention in most cases. She is also mildly constipated. She never developed any significant peripheral  neuropathy. Her last menstrual period was late February. She has had some hot flashes, worse around the time of each chemotherapy treatment. Otherwise a detailed review of systems was noncontributory. She was able to work throughout her treatment, just now taking off a couple of weeks to recover from chemotherapy prior to her upcoming surgery.  PAST MEDICAL HISTORY: Past Medical History  Diagnosis Date  . Chicken pox as a child  . Allergy     seasonal- spring  . Urticaria   . History of streptococcal pharyngitis   . History of mononucleosis   . Anemia     mild  . Allergic state 12/04/2010  . Preventative health care 12/04/2010  . Anxiety 02/11/2011  . Insomnia 02/11/2011  . PONV (postoperative nausea and vomiting)   . Cancer     Lt breast CA    PAST SURGICAL HISTORY: Past Surgical History  Procedure Date  . Mouth surgery 1997    gum surgery  . Cesarean section 1999    X 1  . Varicose vein surgery     [place] [facility]  . Portacath placement 03/20/2011    Procedure: INSERTION PORT-A-CATH;  Surgeon: Robyne Askew, MD;  Location: [facility];  Service: General;  Laterality: Right;  placement of port -Right Subclavian Vein    FAMILY HISTORY Family History  Problem Relation Age of Onset  . Adopted: Yes    Gynecologic history: She had menarche age 30, was still having regular periods at the time of her diagnosis. She used birth control pills for approximately 10 years, with no clotting or other complications. She is GX P2. Age  at first live birth was 15.   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 Her husband Abigail [name] is in accounting. Son Abigail [name] (pronounced with a hard "g") is 15 and in the ninth grade, daughter Abigail [name] is 13-1/7 grade.   ADVANCED DIRECTIVES: Not in place  HEALTH MAINTENANCE: History  Substance Use Topics  . Smoking status: Never Smoker   . Smokeless tobacco: Never Used  . Alcohol Use: 0.6  oz/week    1 Glasses of wine per week     occasionally- social     Colonoscopy:  PAP:  Bone density:  Lipid panel:  Allergies  Allergen Reactions  . Penicillins Rash    Current Outpatient Prescriptions  Medication Sig Dispense Refill  . diazepam (VALIUM) 5 MG tablet Take 1/2 tablet to 2 tablets daily as needed  20 tablet  0  . doxycycline (VIBRA-TABS) 100 MG tablet Take 1 tablet (100 mg total) by mouth daily.  30 tablet  1  . lidocaine-prilocaine (EMLA) cream Apply as directed to port site 1 hour before tx  30 g  1  . Multiple Vitamin (MULTIVITAMIN) tablet Take 1 tablet by mouth daily.        Marland Kitchen omeprazole (PRILOSEC) 20 MG capsule Take 1 capsule (20 mg total) by mouth 2 (two) times daily.  60 capsule  3  . ondansetron (ZOFRAN) 8 MG tablet Take 1 tablet two times a day starting the day after chemo for 3 days. Then take 1 tab two times a day as needed for nausea or vomiting.  30 tablet  1  . prochlorperazine (COMPAZINE) 10 MG tablet Take 1 tablet (10 mg total) by mouth every 6 (six) hours as needed (Nausea or vomiting).  30 tablet  1  . zolpidem (AMBIEN) 10 MG tablet Take 5 mg by mouth at bedtime as needed. Takes 1/2 of 10 mg tablet as needed related to shift work.      . Calcium Carbonate-Vitamin D (CALCIUM + D PO) Take 1 tablet by mouth 2 (two) times daily.        Marland Kitchen HYDROcodone-acetaminophen (NORCO) 5-325 MG per tablet       . LORazepam (ATIVAN) 0.5 MG tablet Take 1 tablet (0.5 mg total) by mouth every 6 (six) hours as needed (Nausea or vomiting).  30 tablet  0  . Melatonin 3 MG TABS Take 1 tablet by mouth at bedtime as needed.      . tobramycin-dexamethasone (TOBRADEX) ophthalmic solution       . zolpidem (AMBIEN) 10 MG tablet Take 1 tablet (10 mg total) by mouth at bedtime as needed for sleep. Needs Ambien, did not respond to generic Zolpidem  30 tablet  1    OBJECTIVE: Middle-aged white woman in no acute distress Filed Vitals:   05/30/11 1115  BP: 103/68  Pulse: 88  Temp: 98.1  F (36.7 C)     Body mass index is 22.29 kg/(m^2).    ECOG FS: 1  Physical Exam: HEENT:  Sclerae anicteric, conjunctivae pink.  Oropharynx clear.  No mucositis or candidiasis.   Nodes:  No cervical, supraclavicular, or axillary lymphadenopathy palpated.  Breast Exam:  Both breasts are lumpy. I do not palpate a well-defined mass in the left breast. The left axilla is clear.  Lungs:  Clear to auscultation bilaterally.  No crackles, rhonchi, or wheezes.   Heart:  Regular rate and rhythm.  No gallops, murmurs, or rubs. Abdomen:  Soft, thin, nontender.  Positive bowel  sounds.  No organomegaly or masses palpated.   Musculoskeletal:  No focal spinal tenderness to palpation.  Extremities:  No significant edema noted Neuro:  Nonfocal. Alert and oriented x3 with normal affect  LAB RESULTS: Lab Results  Component Value Date   WBC 23.4* 05/30/2011   NEUTROABS 10.9* 05/23/2011   HGB 10.8* 05/30/2011   HCT 33.0* 05/30/2011   MCV 95.4 05/30/2011   PLT 316 05/30/2011      Chemistry      Component Value Date/Time   NA 139 05/23/2011 1018   K 3.7 05/23/2011 1018   CL 102 05/23/2011 1018   CO2 26 05/23/2011 1018   BUN 22 05/23/2011 1018   CREATININE 0.56 05/23/2011 1018      Component Value Date/Time   CALCIUM 10.2 05/23/2011 1018   ALKPHOS 141* 05/23/2011 1018   AST 22 05/23/2011 1018   ALT 44* 05/23/2011 1018   BILITOT 0.3 05/23/2011 1018       Lab Results  Component Value Date   LABCA2 23 02/27/2011     STUDIES: . BILATERAL BREAST MRI WITH AND WITHOUT CONTRAST  Technique: Multiplanar, multisequence MR images of both breasts  were obtained prior to and following the intravenous administration  of 12ml of MultiHance. Three dimensional images were evaluated at  the independent DynaCad workstation.  Comparison: Previous examinations, including the breast MR dated  02/26/2011.  Findings: Mild background nodular parenchymal enhancement with a  significant decrease in enhancement and nodularity since  the  previous examination.  The previously demonstrated 2.7 x 2.4 x 1.7 cm oval enhancing mass  with mildly ill-defined margins deep in the upper inner quadrant of  the left breast demonstrates less enhancement than on the previous  examination. This currently demonstrates minimal low grade  enhancement with persistent kinetics and measures 2.4 x 1.5 x 1.1  cm in maximum dimensions.  Slightly more inferiorly and laterally, in the posterior aspect of  the central left breast, there is a small, oval, mildly irregular  area of similar enhancement, measuring 0.8 x 0.7 x 0.5 cm in  maximum dimensions. This has a somewhat linear configuration in  the sagittal plane.  In the deep 6 o'clock position of the right breast, there is a  clumped linear area of low grade enhancement with persistent  kinetics. This measures 0.8 x 0.7 x 0.2 cm in maximum dimensions.  There are multiple additional small, vague, ill defined areas of  clumped enhancement elsewhere in both breasts which are less  prominent.  Multiple small cysts are again demonstrated in both breasts. No  enlarged lymph nodes are seen.  IMPRESSION:  1. Positive response to chemotherapy with an interval decrease in  size and enhancement of the known malignancy deep in the upper  inner left breast.  2. Interval decrease in background parenchymal enhancement  following chemotherapy with small islands of residual enhancement  in both breasts, as described above. These may represent areas of  focal residual fibrocystic change. The most prominent of these is  a 0.8 x 0.7 x 0.2 cm area of clumped linear low grade enhancement  with persistent kinetics deep in the 6 o'clock position of the  right breast. Unless bilateral mastectomies are planned,  consideration of MR guided core needle biopsy of this area is  recommended to exclude ductal carcinoma in situ.  3. Multiple small bilateral breast cysts.  THREE-DIMENSIONAL MR IMAGE RENDERING ON  INDEPENDENT WORKSTATION:  Three-dimensional MR images were rendered by post-processing of the  original MR  data on an independent workstation. The three-  dimensional MR images were interpreted, and findings were reported  in the accompanying complete MRI report for this study.  BI-RADS CATEGORY 4: Suspicious abnormality - biopsy should be  considered.  Recommendation: Consideration of MR guided core needle biopsy of a  0.8 x 0.7 x 0.2 cm area of clumped linear enhancement deep in the 6  o'clock position of the right breast.  Original Report Authenticated By: Darrol Angel, M.D.        ASSESSMENT: 46 year old [place] woman status post left breast biopsy 01/24/ 2013, for a clinical T2 N0, stage IIA invasive ductal carcinoma, grade 2, strongly estrogen and progesterone receptor positive, HER-2 not amplified, with an MIB-1 of 30%.   (1) status post 4 doses of docetaxel/cyclophosphamide, completed 05/23/2011   PLAN: The left breast mass is no longer apparent to my eye, but of course I do not have all the MRI images. Dr. Carolynne Edouard will be reviewing this with the patient to make a final decision regarding whether lumpectomy versus mastectomy is optimal.  Dr. Azucena Kuba notes multiple small irregularities in both breasts. I think this is at do to the breast parenchyma becoming less dense (because of less estrogen stimulation) in a patchy configuration, but of course I cannot be sure. Dr. Azucena Kuba has suggested biopsy and this is again something that Dr. Carolynne Edouard can discuss with the patient.  [place] also expressed concerns regarding radiation to the heart. I am asking Dr. Michell Heinrich to meet with her to explain R. breast holding and other techniques which largely avoid heart damage in this situation.  I anticipate Kmari is going to be having her definitive surgery within 3 weeks. She will return to see me late June. Whenever she completes radiation, or if she does not have radiation, then we will start on  tamoxifen and today gave her some written information on that medication. Mathis Cashman C    05/30/2011

## 2011-05-30 NOTE — Telephone Encounter (Signed)
Spoke with pt to tell her that Dr. Carolynne Edouard would discuss all options with her at her 06/06/11 appointment.

## 2011-05-30 NOTE — Telephone Encounter (Signed)
Abigail [name] from [facility] called asking about an MR order on this patient.  Pt has already been d/c by Dr. Darnelle Catalan and has called the [facility] trying to set up her own procedure.  Will Dr. Carolynne Edouard order MRI bx?  Please call Abigail [name] at [phone].

## 2011-05-30 NOTE — Telephone Encounter (Signed)
gave patient appointment for dr.squire on 06-05-2011 at 8:30am gave patient appointment for 07-22-2011 at 9:30am printed out calendar and gave to the patient

## 2011-05-31 ENCOUNTER — Other Ambulatory Visit (INDEPENDENT_AMBULATORY_CARE_PROVIDER_SITE_OTHER): Payer: Self-pay | Admitting: General Surgery

## 2011-05-31 ENCOUNTER — Telehealth (INDEPENDENT_AMBULATORY_CARE_PROVIDER_SITE_OTHER): Payer: Self-pay | Admitting: General Surgery

## 2011-05-31 DIAGNOSIS — C50219 Malignant neoplasm of upper-inner quadrant of unspecified female breast: Secondary | ICD-10-CM

## 2011-05-31 DIAGNOSIS — R928 Other abnormal and inconclusive findings on diagnostic imaging of breast: Secondary | ICD-10-CM

## 2011-05-31 NOTE — Telephone Encounter (Signed)
Told pt that Olegario Messier from the [facility] got the order for the MR and MM from Dr Carolynne Edouard and that Olegario Messier will be giving her a call for her appt

## 2011-06-05 ENCOUNTER — Ambulatory Visit: Payer: BC Managed Care – PPO

## 2011-06-05 ENCOUNTER — Ambulatory Visit
Admission: RE | Admit: 2011-06-05 | Discharge: 2011-06-05 | Disposition: A | Discharge status: Home / Self Care | Payer: BC Managed Care – PPO | Source: Ambulatory Visit | Attending: General Surgery | Admitting: General Surgery

## 2011-06-05 ENCOUNTER — Ambulatory Visit: Payer: BC Managed Care – PPO | Admitting: Radiation Oncology

## 2011-06-05 DIAGNOSIS — C50219 Malignant neoplasm of upper-inner quadrant of unspecified female breast: Secondary | ICD-10-CM

## 2011-06-05 DIAGNOSIS — R928 Other abnormal and inconclusive findings on diagnostic imaging of breast: Secondary | ICD-10-CM

## 2011-06-05 DIAGNOSIS — C50919 Malignant neoplasm of unspecified site of unspecified female breast: Secondary | ICD-10-CM

## 2011-06-05 HISTORY — DX: Malignant neoplasm of unspecified site of unspecified female breast: C50.919

## 2011-06-05 MED ORDER — GADOBENATE DIMEGLUMINE 529 MG/ML IV SOLN
12.0000 mL | Freq: Once | INTRAVENOUS | Status: AC | PRN
  Administered 2011-06-05: 12 mL via INTRAVENOUS

## 2011-06-06 ENCOUNTER — Ambulatory Visit (INDEPENDENT_AMBULATORY_CARE_PROVIDER_SITE_OTHER): Payer: BC Managed Care – PPO | Admitting: General Surgery

## 2011-06-06 ENCOUNTER — Other Ambulatory Visit (INDEPENDENT_AMBULATORY_CARE_PROVIDER_SITE_OTHER): Payer: Self-pay | Admitting: General Surgery

## 2011-06-06 ENCOUNTER — Encounter (INDEPENDENT_AMBULATORY_CARE_PROVIDER_SITE_OTHER): Payer: Self-pay | Admitting: General Surgery

## 2011-06-06 VITALS — BP 101/62 | HR 88 | Temp 98.6°F | Resp 14 | Ht 66.0 in | Wt 141.8 lb

## 2011-06-06 DIAGNOSIS — Z421 Encounter for breast reconstruction following mastectomy: Secondary | ICD-10-CM

## 2011-06-06 DIAGNOSIS — C50219 Malignant neoplasm of upper-inner quadrant of unspecified female breast: Secondary | ICD-10-CM

## 2011-06-06 NOTE — Patient Instructions (Signed)
Will refer to plastics

## 2011-06-07 ENCOUNTER — Encounter: Payer: Self-pay | Admitting: Radiation Oncology

## 2011-06-07 NOTE — Progress Notes (Signed)
I spoke with Ms. Goodlow on the phone today. This was after reviewing the results from her recent MRIs and biopsy. She demonstrated partial response to chemotherapy on the MRI. The tumor in the left breast has decreased in size. There is no evidence of lymphadenopathy. The right breast showed a new abnormality which was biopsied demonstrating atypical cells. The patient intends to undergo bilateral mastectomies.   Considering that the patient  has clinical T2 N0 disease, there are no indications for postmastectomy radiation at this time. I think that immediate reconstruction is appropriate. I told the patient that there is no guarantee that she won't show pathologic evidence of lymph node metastases and if that ends up being the case I would recommend that she see me again to discuss postmastectomy radiation. However, considering that yielding positive lymph nodes is not likely, I support the plans for immediate reconstruction. She felt comfortable after our discussion and deferred on further followup with me in person at this time. The patient's scheduled appointment for this coming Monday will be canceled. I told the patient I would make the other specialists in her care aware of our discussion.

## 2011-06-07 NOTE — Progress Notes (Signed)
Subjective:     Patient ID: Abigail [name], female   DOB: July 21, 1965, 46 y.o.   MRN: [id number]  HPI The patient is a 46 year old white female who has a known cancer of her left upper and quadrant of her breast. I believe the original size of the tumor was 2.7 cm. She finished her course of neoadjuvant chemotherapy about 2 weeks ago. Her most recent MRI showed that the tumor did shrink some period the MRI also showed a new area on the right. 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 After much consideration she does not feel as though she could go through this whole process again. She is strongly considering bilateral mastectomies and a left sentinel lymph node biopsy.  Review of Systems  Constitutional: Negative.   HENT: Negative.   Eyes: Negative.   Respiratory: Negative.   Cardiovascular: Negative.   Gastrointestinal: Negative.   Genitourinary: Negative.   Musculoskeletal: Negative.   Skin: Negative.   Neurological: Negative.   Hematological: Negative.   Psychiatric/Behavioral: Negative.        Objective:   Physical Exam  Constitutional: She is oriented to person, place, and time. She appears well-developed and well-nourished.  HENT:  Head: Normocephalic and atraumatic.  Eyes: Conjunctivae and EOM are normal. Pupils are equal, round, and reactive to light.  Neck: Normal range of motion. Neck supple.  Cardiovascular: Normal rate, regular rhythm and normal heart sounds.   Pulmonary/Chest: Effort normal and breath sounds normal.       The mass in the upper inner left breast is no longer palpable. She does have a baseline of diffuse nodular breast tissue bilaterally. No palpable axillary supraclavicular or cervical lymphadenopathy  Abdominal: Soft. Bowel sounds are normal. She exhibits no mass. There is no tenderness.  Musculoskeletal: Normal range of motion.  Lymphadenopathy:    She has no cervical adenopathy.    Neurological: She is alert and oriented to person, place, and time.  Skin: Skin is warm and dry.  Psychiatric: She has a normal mood and affect. Her behavior is normal.       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 We spent a lot of time discussing the different options today. She is strongly favoring bilateral mastectomies and left sentinel lymph node biopsy.    Plan:     At this point we will refer her to Dr. Odis Luster and plastic surgery to talk about options for reconstruction. We will also contact the radiation oncologist to see what the likelihood is that she would need postmastectomy radiation should she choose bilateral mastectomies. Once we have these answers then we will proceed accordingly. I have discussed with her in detail the risks and benefits of the operation to remove both breasts as well as some of the technical aspects and she understands and wishes to proceed

## 2011-06-10 ENCOUNTER — Ambulatory Visit
Admission: RE | Admit: 2011-06-10 | Payer: BC Managed Care – PPO | Source: Ambulatory Visit | Admitting: Radiation Oncology

## 2011-06-10 ENCOUNTER — Ambulatory Visit: Payer: BC Managed Care – PPO

## 2011-06-10 ENCOUNTER — Encounter: Payer: Self-pay | Admitting: Radiation Oncology

## 2011-06-26 ENCOUNTER — Other Ambulatory Visit (INDEPENDENT_AMBULATORY_CARE_PROVIDER_SITE_OTHER): Payer: Self-pay | Admitting: General Surgery

## 2011-06-26 DIAGNOSIS — C50919 Malignant neoplasm of unspecified site of unspecified female breast: Secondary | ICD-10-CM

## 2011-06-27 ENCOUNTER — Telehealth: Payer: Self-pay | Admitting: *Deleted

## 2011-06-27 ENCOUNTER — Other Ambulatory Visit: Payer: Self-pay | Admitting: *Deleted

## 2011-06-27 DIAGNOSIS — C50919 Malignant neoplasm of unspecified site of unspecified female breast: Secondary | ICD-10-CM

## 2011-06-27 NOTE — Telephone Encounter (Signed)
Pt called with concerns of surgery being 11wks post last chemo dosage.  Informed pt that surgery is generally 4-6 wks post last treatment, but that I will investigate reasoning for the delay.  Surgery is currently scheduled for 7/10 with Dr. Carolynne Edouard and Odis Luster.  We will r/s f/u with Dr. Darnelle Catalan if surgery remains 7/10.  Pt denies further needs or concerns at this time.  Encourage pt to call with questions.  Received verbal understanding.  Contact information given.

## 2011-06-27 NOTE — Telephone Encounter (Signed)
Explained to pt that surgery delay was d/t surgeon conflicts.  Per Dr. Darnelle Catalan pt to start taking Tamoxifen 20mg  1 tab po daily.  Gave pt these directions and called into [employer].  Pt denies further needs or concerns.  Encourage pt to call with questions.  Contact information given.

## 2011-06-28 MED ORDER — TAMOXIFEN CITRATE 20 MG PO TABS: 20.0000 mg | ORAL_TABLET | Freq: Every day | ORAL | Status: AC

## 2011-07-22 ENCOUNTER — Ambulatory Visit: Payer: BC Managed Care – PPO | Admitting: Oncology

## 2011-07-25 ENCOUNTER — Telehealth: Payer: Self-pay | Admitting: *Deleted

## 2011-07-25 ENCOUNTER — Ambulatory Visit (HOSPITAL_BASED_OUTPATIENT_CLINIC_OR_DEPARTMENT_OTHER): Payer: BC Managed Care – PPO | Admitting: Oncology

## 2011-07-25 VITALS — BP 95/61 | HR 73 | Temp 98.1°F | Ht 66.0 in | Wt 135.8 lb

## 2011-07-25 DIAGNOSIS — Z170 Estrogen receptor positive status [ER+]: Secondary | ICD-10-CM

## 2011-07-25 DIAGNOSIS — N898 Other specified noninflammatory disorders of vagina: Secondary | ICD-10-CM

## 2011-07-25 DIAGNOSIS — C50219 Malignant neoplasm of upper-inner quadrant of unspecified female breast: Secondary | ICD-10-CM

## 2011-07-25 DIAGNOSIS — N959 Unspecified menopausal and perimenopausal disorder: Secondary | ICD-10-CM

## 2011-07-25 MED ORDER — GABAPENTIN 300 MG PO CAPS: 300.0000 mg | ORAL_CAPSULE | Freq: Three times a day (TID) | ORAL | Status: DC

## 2011-07-25 NOTE — Progress Notes (Signed)
ID: Abigail [name]   DOB: 03/23/1965  MR#: [id number]  GEX#:[id number]  HISTORY OF PRESENT ILLNESS: The patient is a 46 year old [place] woman who had screening mammography at [facility] 02/01/2011, showing calcifications in the left breast. She returned for diagnostic left mammography 02/21/2011, and this showed a 1.1 cm cluster of pleomorphic microcalcifications in the upper inner aspect of the left breast. Biopsy of this area was performed the same day, and showed ([id number]) An invasive ductal carcinoma, grade 2, which was estrogen receptor positive at 89%, progesterone receptor positive at 99%, with an MIB-1 of 30% and no HER-2 amplification. The patient was then set up for bilateral breast MRIs, performed 02/26/2011. There were multiple small cysts in both breasts. In the upper inner quadrant of the left breast there was a 2.7 cm enhancing mass with ill-defined margins. This contained a biopsy clip. There was a hematoma superior to this mass, measuring 2.2 cm. There were no other suspicious areas in either breast and no abnormal appearing lymph nodes.  With this information the patient's case was presented at the multidisciplinary breast cancer conference 02/27/2011, and she was seen in the [facility] clinic the same day. A decision was made to proceed with neoadjuvant treatment. Her subsequent history is as detailed below.  INTERVAL HISTORY: Abigail [name] returns today  followup of her breast cancer. Her surgery has been delayed so that she could have immediate reconstruction, and so she was started on tamoxifen May 2013. In the meantime she has been found to have a BRCA mutation of uncertain significance.   REVIEW OF SYSTEMS: She is having hot flashes from the tamoxifen, worse at night than during the day. She's also having vaginal dryness problems. She has a sticky yellow discharge perhaps once a day. She is having some nail changes of a, and worries that she may lose some of her nails. Otherwise a  detailed review of systems is noncontributory.  PAST MEDICAL HISTORY: Past Medical History  Diagnosis Date  . Chicken pox as a child  . Allergy     seasonal- spring  . Urticaria   . History of streptococcal pharyngitis   . History of mononucleosis   . Anemia     mild  . Allergic state 12/04/2010  . Preventative health care 12/04/2010  . Anxiety 02/11/2011  . Insomnia 02/11/2011  . PONV (postoperative nausea and vomiting)   . 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 S/P chemotherapy, time since less than 4 weeks 05/23/11    completed 4 doses docetaxel/cyclophosphamide  . Hot flashes     PAST SURGICAL HISTORY: Past Surgical History  Procedure Date  . Mouth surgery 1997    gum surgery  . Cesarean section 1999    X 1  . Varicose vein surgery     [place] [facility]  . Portacath placement 03/20/2011    Procedure: INSERTION PORT-A-CATH;  Surgeon: Robyne Askew, MD;  Location: [facility];  Service: General;  Laterality: Right;  placement of port -Right Subclavian Vein  . Colonoscopy     FAMILY HISTORY Family History  Problem Relation Age of Onset  . Adopted: Yes    Gynecologic history: She had menarche age 68, was still having regular periods at the time of her diagnosis, but periods stopped with her chemotherapy. She is s/p BTL.She  used birth control pills for approximately 10 years, with no clotting or other complications. She is GX P2. Age at first live birth was 56.   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 Her husband Abigail [name] is in accounting. Son Abigail [name] (pronounced with a hard "g") is 15 and in the ninth grade, daughter Abigail [name] is 13-1/7 grade.   ADVANCED DIRECTIVES: Not in place  HEALTH MAINTENANCE: History  Substance Use  Topics  . Smoking status: Never Smoker   . Smokeless tobacco: Never Used  . Alcohol Use: 0.6 oz/week    1 Glasses of wine per week     occasionally- social     Colonoscopy:  PAP:  Bone density:  Lipid panel:  Allergies  Allergen Reactions  . Penicillins Rash    Current Outpatient Prescriptions  Medication Sig Dispense Refill  . Calcium Carbonate-Vitamin D (CALCIUM + D PO) Take 1 tablet by mouth 2 (two) times daily.        . diazepam (VALIUM) 5 MG tablet Take 1/2 tablet to 2 tablets daily as needed  20 tablet  0  . Melatonin 3 MG TABS Take 1 tablet by mouth at bedtime as needed.      . Multiple Vitamin (MULTIVITAMIN) tablet Take 1 tablet by mouth daily.        . tamoxifen (NOLVADEX) 20 MG tablet Take 1 tablet (20 mg total) by mouth daily.  30 tablet  11  . zolpidem (AMBIEN) 10 MG tablet Take 5 mg by mouth at bedtime as needed. Takes 1/2 of 10 mg tablet as needed related to shift work.      Marland Kitchen omeprazole (PRILOSEC) 20 MG capsule Take 1 capsule (20 mg total) by mouth 2 (two) times daily.  60 capsule  3    OBJECTIVE: Middle-aged white woman in no acute distress Filed Vitals:   07/25/11 0922  BP: 95/61  Pulse: 73  Temp: 98.1 F (36.7 C)     Body mass index is 21.92 kg/(m^2).    ECOG FS: 0  Sclerae unicteric Oropharynx clear No cervical or supraclavicular adenopathy Lungs no rales or rhonchi Heart regular rate and rhythm Abd benign MSK no focal spinal tenderness, no peripheral edema Neuro: nonfocal Breasts: The right breast doesn't show any masses or skin changes of concern. The left breast is status post lumpectomy. There is no evidence of local recurrence. Both axillae are clear  LAB RESULTS: Lab Results  Component Value Date   WBC 23.4* 05/30/2011   NEUTROABS 10.9* 05/23/2011   HGB 10.8* 05/30/2011   HCT 33.0* 05/30/2011   MCV 95.4 05/30/2011   PLT 316 05/30/2011      Chemistry      Component Value Date/Time   NA 139 05/23/2011 1018   K 3.7 05/23/2011 1018   CL 102  05/23/2011 1018   CO2 26 05/23/2011 1018   BUN 22 05/23/2011 1018   CREATININE 0.56 05/23/2011 1018      Component Value Date/Time   CALCIUM 10.2 05/23/2011 1018   ALKPHOS 141* 05/23/2011 1018   AST 22 05/23/2011 1018   ALT 44* 05/23/2011 1018   BILITOT 0.3 05/23/2011 1018       Lab Results  Component Value Date   LABCA2 23 02/27/2011     STUDIES: No results found.     ASSESSMENT: 46 year old [place] woman with a BRCA mutation of uncertain significance  (1) status post left breast biopsy 01/24/ 2013, for a clinical T2 N0,  stage IIA invasive ductal carcinoma, grade 2, strongly estrogen and progesterone receptor positive, HER-2 not amplified, with an MIB-1 of 30%.   (2) status post 4 doses of docetaxel/cyclophosphamide, completed 05/23/2011  (3) on tamoxifen since May 2013   PLAN: I think Darenda will benefit from trying gabapentin at bedtime. I wrote her the prescription and she will let me know if that works for her. We also talked about using Vagifem suppositories or ESTRING for the vaginal dryness problems, and local estrogen use is fine with me salon she is on tamoxifen. We also discussed the rationale for taking tamoxifen for the next 5 years.  The BRCA mutation of uncertain significance is troubling. It reinforces of her need to have bilateral mastectomies. She may also need bilateral salpingo-oophorectomy. I have encouraged her to establish yourself with a gynecologist and I have given her some suggestions regarding that.  She is tentatively scheduled for definitive of bilateral mastectomies with implant reconstruction July 10. I have made her a return appointment here for a late August, but if everything is going well we can move that to October.  She knows to call for any problems that may develop before the next visit Mandalyn Pasqua C    07/25/2011

## 2011-07-25 NOTE — Telephone Encounter (Signed)
gave patient appointment for 10-2011 per patient request

## 2011-07-28 ENCOUNTER — Encounter: Payer: Self-pay | Admitting: Oncology

## 2011-07-28 ENCOUNTER — Other Ambulatory Visit: Payer: Self-pay | Admitting: Oncology

## 2011-07-31 ENCOUNTER — Encounter (HOSPITAL_COMMUNITY): Payer: Self-pay

## 2011-07-31 ENCOUNTER — Encounter (HOSPITAL_COMMUNITY): Payer: Self-pay | Admitting: Pharmacy Technician

## 2011-07-31 ENCOUNTER — Encounter (HOSPITAL_COMMUNITY)
Admission: RE | Admit: 2011-07-31 | Discharge: 2011-07-31 | Disposition: A | Discharge status: Home / Self Care | Payer: BC Managed Care – PPO | Source: Ambulatory Visit | Attending: General Surgery | Admitting: General Surgery

## 2011-07-31 LAB — BASIC METABOLIC PANEL
Chloride: 101 mEq/L (ref 96–112)
Creatinine, Ser: 0.73 mg/dL (ref 0.50–1.10)
GFR calc Af Amer: >90 mL/min (ref >90)
Potassium: 4.2 mEq/L (ref 3.5–5.1)

## 2011-07-31 LAB — SURGICAL PCR SCREEN: MRSA, PCR: NEGATIVE

## 2011-07-31 LAB — CBC
MCV: 94.9 fL (ref 78.0–100.0)
Platelets: 262 10*3/uL (ref 150–400)
RDW: 11.6 % (ref 11.5–15.5)
WBC: 4.3 10*3/uL (ref 4.0–10.5)

## 2011-07-31 NOTE — Pre-Procedure Instructions (Addendum)
20 Abigail [name]  07/31/2011   Your procedure is scheduled on: Wednesday, July10th.  Report to [facility] at 10:00 AM.  Call this number if you have problems the morning of surgery: [phone]   Remember:   Do not eat food or drink any liquid.:After Midnight.      Take these medicines the morning of surgery with A SIP OF WATER: Gabapentin (Neurotin). Discontinue Aspirin, NSAIDs and Herbal medications.  Do not take Coumadin, Plavix or Effient.  Do not wear jewelry, make-up or nail polish.  Do not wear lotions, powders, or perfumes. You may wear deodorant.  Do not shave 48 hours prior to surgery. Men may shave face and neck.  Do not bring valuables to the hospital.  Contacts, dentures or bridgework may not be worn into surgery.  Leave suitcase in the car. After surgery it may be brought to your room.  For patients admitted to the hospital, checkout time is 11:00 AM the day of discharge.   Patients discharged the day of surgery will not be allowed to drive home.  Name and phone number of your driver: NA  Special Instructions: CHG Shower Use Special Wash: 1/2 bottle night before surgery and 1/2 bottle morning of surgery.   Please read over the following fact sheets that you were given: Pain Booklet, Coughing and Deep Breathing and Surgical Site Infection Prevention

## 2011-08-06 MED ORDER — VANCOMYCIN HCL IN DEXTROSE 1-5 GM/200ML-% IV SOLN
1000.0000 mg | INTRAVENOUS | Status: AC
  Administered 2011-08-07: 1000 mg via INTRAVENOUS
  Filled 2011-08-06: qty 200

## 2011-08-06 NOTE — Progress Notes (Signed)
Chart left out for time change call. Pt. Already instructed to be here at 1000 by preadmission nurse.

## 2011-08-07 ENCOUNTER — Encounter (HOSPITAL_COMMUNITY)
Admission: RE | Disposition: A | Discharge status: Home Health | Payer: Self-pay | Source: Ambulatory Visit | Attending: General Surgery

## 2011-08-07 ENCOUNTER — Encounter (HOSPITAL_COMMUNITY): Payer: Self-pay | Admitting: Anesthesiology

## 2011-08-07 ENCOUNTER — Inpatient Hospital Stay (HOSPITAL_COMMUNITY)
Admission: RE | Admit: 2011-08-07 | Discharge: 2011-08-09 | DRG: 258 | Disposition: A | Discharge status: Home Health | Payer: BC Managed Care – PPO | Source: Ambulatory Visit | Attending: General Surgery | Admitting: General Surgery

## 2011-08-07 ENCOUNTER — Ambulatory Visit (HOSPITAL_COMMUNITY)
Admission: RE | Admit: 2011-08-07 | Discharge: 2011-08-07 | Disposition: A | Discharge status: Home / Self Care | Payer: BC Managed Care – PPO | Source: Ambulatory Visit | Attending: General Surgery | Admitting: General Surgery

## 2011-08-07 ENCOUNTER — Ambulatory Visit (HOSPITAL_COMMUNITY): Payer: BC Managed Care – PPO | Admitting: Anesthesiology

## 2011-08-07 ENCOUNTER — Encounter (HOSPITAL_COMMUNITY): Payer: Self-pay | Admitting: *Deleted

## 2011-08-07 DIAGNOSIS — C50919 Malignant neoplasm of unspecified site of unspecified female breast: Principal | ICD-10-CM | POA: Diagnosis present

## 2011-08-07 DIAGNOSIS — N6089 Other benign mammary dysplasias of unspecified breast: Secondary | ICD-10-CM

## 2011-08-07 DIAGNOSIS — Z452 Encounter for adjustment and management of vascular access device: Secondary | ICD-10-CM

## 2011-08-07 DIAGNOSIS — D0590 Unspecified type of carcinoma in situ of unspecified breast: Secondary | ICD-10-CM

## 2011-08-07 HISTORY — PX: MASTECTOMY W/ SENTINEL NODE BIOPSY: SHX2001

## 2011-08-07 HISTORY — PX: PORT-A-CATH REMOVAL: SHX5289

## 2011-08-07 HISTORY — PX: TISSUE EXPANDER PLACEMENT: SHX2530

## 2011-08-07 SURGERY — REMOVAL PORT-A-CATH
Anesthesia: General | Site: Chest | Laterality: Right | Wound class: Clean

## 2011-08-07 MED ORDER — CHLORHEXIDINE GLUCONATE 4 % EX LIQD: 1.0000 "application " | Freq: Once | CUTANEOUS | Status: DC

## 2011-08-07 MED ORDER — DEXTROSE-NACL 5-0.45 % IV SOLN
INTRAVENOUS | Status: DC
  Administered 2011-08-07: 19:00:00 via INTRAVENOUS
  Administered 2011-08-08 – 2011-08-09 (×2): 1000 mL via INTRAVENOUS
  Administered 2011-08-09: 02:00:00 via INTRAVENOUS

## 2011-08-07 MED ORDER — 0.9 % SODIUM CHLORIDE (POUR BTL) OPTIME
TOPICAL | Status: DC | PRN
  Administered 2011-08-07: 3000 mL

## 2011-08-07 MED ORDER — TAMOXIFEN CITRATE 10 MG PO TABS: 20.0000 mg | ORAL_TABLET | Freq: Every day | ORAL | Status: DC

## 2011-08-07 MED ORDER — METHOCARBAMOL 500 MG PO TABS
500.0000 mg | ORAL_TABLET | Freq: Four times a day (QID) | ORAL | Status: DC | PRN
  Administered 2011-08-07 – 2011-08-09 (×4): 500 mg via ORAL
  Filled 2011-08-07 (×2): qty 1

## 2011-08-07 MED ORDER — SODIUM CHLORIDE 0.9 % IJ SOLN: 9.0000 mL | INTRAMUSCULAR | Status: DC | PRN

## 2011-08-07 MED ORDER — LACTATED RINGERS IV SOLN
INTRAVENOUS | Status: DC | PRN
  Administered 2011-08-07 (×4): via INTRAVENOUS

## 2011-08-07 MED ORDER — LORAZEPAM 2 MG/ML IJ SOLN: 1.0000 mg | Freq: Once | INTRAMUSCULAR | Status: DC | PRN

## 2011-08-07 MED ORDER — SCOPOLAMINE 1 MG/3DAYS TD PT72
1.0000 | MEDICATED_PATCH | Freq: Once | TRANSDERMAL | Status: DC
  Filled 2011-08-07: qty 1

## 2011-08-07 MED ORDER — VANCOMYCIN HCL IN DEXTROSE 1-5 GM/200ML-% IV SOLN
1000.0000 mg | Freq: Two times a day (BID) | INTRAVENOUS | Status: DC
  Administered 2011-08-07 – 2011-08-09 (×4): 1000 mg via INTRAVENOUS
  Filled 2011-08-07 (×5): qty 200

## 2011-08-07 MED ORDER — ONDANSETRON HCL 4 MG/2ML IJ SOLN
INTRAMUSCULAR | Status: DC | PRN
  Administered 2011-08-07 (×2): 4 mg via INTRAVENOUS

## 2011-08-07 MED ORDER — GLYCOPYRROLATE 0.2 MG/ML IJ SOLN
INTRAMUSCULAR | Status: DC | PRN
  Administered 2011-08-07: 1 mg via INTRAVENOUS

## 2011-08-07 MED ORDER — ONDANSETRON HCL 4 MG/2ML IJ SOLN: 4.0000 mg | Freq: Four times a day (QID) | INTRAMUSCULAR | Status: DC | PRN

## 2011-08-07 MED ORDER — DIPHENHYDRAMINE HCL 12.5 MG/5ML PO ELIX
12.5000 mg | ORAL_SOLUTION | Freq: Four times a day (QID) | ORAL | Status: DC | PRN
  Filled 2011-08-07: qty 5

## 2011-08-07 MED ORDER — MIDAZOLAM HCL 2 MG/2ML IJ SOLN
1.0000 mg | INTRAMUSCULAR | Status: DC | PRN
  Administered 2011-08-07: 2 mg via INTRAVENOUS

## 2011-08-07 MED ORDER — NEOSTIGMINE METHYLSULFATE 1 MG/ML IJ SOLN
INTRAMUSCULAR | Status: DC | PRN
  Administered 2011-08-07: 5 mg via INTRAVENOUS

## 2011-08-07 MED ORDER — LACTATED RINGERS IV SOLN
INTRAVENOUS | Status: DC
  Administered 2011-08-07: 12:00:00 via INTRAVENOUS

## 2011-08-07 MED ORDER — ACETAMINOPHEN 10 MG/ML IV SOLN
INTRAVENOUS | Status: AC
  Filled 2011-08-07: qty 100

## 2011-08-07 MED ORDER — SODIUM CHLORIDE 0.9 % IJ SOLN
INTRAMUSCULAR | Status: DC | PRN
  Administered 2011-08-07: 13:00:00

## 2011-08-07 MED ORDER — ROCURONIUM BROMIDE 100 MG/10ML IV SOLN
INTRAVENOUS | Status: DC | PRN
  Administered 2011-08-07: 50 mg via INTRAVENOUS

## 2011-08-07 MED ORDER — DEXTROSE 5 % IV SOLN
INTRAVENOUS | Status: DC | PRN
  Administered 2011-08-07: 12:00:00 via INTRAVENOUS

## 2011-08-07 MED ORDER — HYDROMORPHONE HCL PF 1 MG/ML IJ SOLN: 0.2500 mg | INTRAMUSCULAR | Status: DC | PRN

## 2011-08-07 MED ORDER — METHYLENE BLUE 1 % INJ SOLN
INTRAMUSCULAR | Status: AC
  Filled 2011-08-07: qty 10

## 2011-08-07 MED ORDER — DOCUSATE SODIUM 100 MG PO CAPS
100.0000 mg | ORAL_CAPSULE | Freq: Every day | ORAL | Status: DC
  Administered 2011-08-07 – 2011-08-09 (×3): 100 mg via ORAL
  Filled 2011-08-07 (×3): qty 1

## 2011-08-07 MED ORDER — GABAPENTIN 300 MG PO CAPS
300.0000 mg | ORAL_CAPSULE | ORAL | Status: DC
  Filled 2011-08-07: qty 1

## 2011-08-07 MED ORDER — MIDAZOLAM HCL 2 MG/2ML IJ SOLN
INTRAMUSCULAR | Status: AC
  Filled 2011-08-07: qty 2

## 2011-08-07 MED ORDER — FENTANYL CITRATE 0.05 MG/ML IJ SOLN
INTRAMUSCULAR | Status: DC | PRN
  Administered 2011-08-07: 100 ug via INTRAVENOUS
  Administered 2011-08-07 (×11): 50 ug via INTRAVENOUS

## 2011-08-07 MED ORDER — TAMOXIFEN CITRATE 10 MG PO TABS
20.0000 mg | ORAL_TABLET | Freq: Every day | ORAL | Status: DC
  Administered 2011-08-07 – 2011-08-09 (×3): 20 mg via ORAL
  Filled 2011-08-07 (×3): qty 2

## 2011-08-07 MED ORDER — DEXAMETHASONE SODIUM PHOSPHATE 10 MG/ML IJ SOLN
INTRAMUSCULAR | Status: DC | PRN
  Administered 2011-08-07: 8 mg via INTRAVENOUS

## 2011-08-07 MED ORDER — VECURONIUM BROMIDE 10 MG IV SOLR
INTRAVENOUS | Status: DC | PRN
  Administered 2011-08-07: 1 mg via INTRAVENOUS
  Administered 2011-08-07: 3 mg via INTRAVENOUS
  Administered 2011-08-07: 2 mg via INTRAVENOUS
  Administered 2011-08-07 (×2): 3 mg via INTRAVENOUS

## 2011-08-07 MED ORDER — HYDROMORPHONE 0.3 MG/ML IV SOLN
INTRAVENOUS | Status: AC
  Filled 2011-08-07: qty 25

## 2011-08-07 MED ORDER — HYDROMORPHONE 0.3 MG/ML IV SOLN
INTRAVENOUS | Status: DC
  Administered 2011-08-07: 18:00:00 via INTRAVENOUS
  Administered 2011-08-08: 0.999 mg via INTRAVENOUS
  Administered 2011-08-08: 1.39 mg via INTRAVENOUS
  Administered 2011-08-08: 0.99 mg via INTRAVENOUS
  Administered 2011-08-08: 0.5 mg via INTRAVENOUS

## 2011-08-07 MED ORDER — LIDOCAINE HCL (CARDIAC) 20 MG/ML IV SOLN
INTRAVENOUS | Status: DC | PRN
  Administered 2011-08-07: 100 mg via INTRAVENOUS

## 2011-08-07 MED ORDER — PROMETHAZINE HCL 25 MG/ML IJ SOLN: 6.2500 mg | INTRAMUSCULAR | Status: DC | PRN

## 2011-08-07 MED ORDER — NALOXONE HCL 0.4 MG/ML IJ SOLN: 0.4000 mg | INTRAMUSCULAR | Status: DC | PRN

## 2011-08-07 MED ORDER — TECHNETIUM TC 99M SULFUR COLLOID FILTERED
1.0000 | Freq: Once | INTRAVENOUS | Status: AC | PRN
  Administered 2011-08-07: 1 via INTRADERMAL

## 2011-08-07 MED ORDER — FENTANYL CITRATE 0.05 MG/ML IJ SOLN
INTRAMUSCULAR | Status: AC
  Filled 2011-08-07: qty 2

## 2011-08-07 MED ORDER — ACETAMINOPHEN 10 MG/ML IV SOLN
INTRAVENOUS | Status: DC | PRN
  Administered 2011-08-07: 1000 mg via INTRAVENOUS

## 2011-08-07 MED ORDER — EPHEDRINE SULFATE 50 MG/ML IJ SOLN
INTRAMUSCULAR | Status: DC | PRN
  Administered 2011-08-07: 5 mg via INTRAVENOUS
  Administered 2011-08-07 (×4): 10 mg via INTRAVENOUS

## 2011-08-07 MED ORDER — PROPOFOL 10 MG/ML IV EMUL
INTRAVENOUS | Status: DC | PRN
  Administered 2011-08-07: 150 mg via INTRAVENOUS

## 2011-08-07 MED ORDER — SODIUM CHLORIDE 0.9 % IR SOLN
Status: DC | PRN
  Administered 2011-08-07: 13:00:00

## 2011-08-07 MED ORDER — GABAPENTIN 300 MG PO CAPS
300.0000 mg | ORAL_CAPSULE | ORAL | Status: DC
  Administered 2011-08-07 – 2011-08-08 (×2): 300 mg via ORAL
  Filled 2011-08-07 (×3): qty 1

## 2011-08-07 MED ORDER — DIPHENHYDRAMINE HCL 50 MG/ML IJ SOLN: 12.5000 mg | Freq: Four times a day (QID) | INTRAMUSCULAR | Status: DC | PRN

## 2011-08-07 MED ORDER — FENTANYL CITRATE 0.05 MG/ML IJ SOLN
50.0000 ug | INTRAMUSCULAR | Status: DC | PRN
  Administered 2011-08-07: 100 ug via INTRAVENOUS

## 2011-08-07 MED ORDER — MIDAZOLAM HCL 5 MG/5ML IJ SOLN
INTRAMUSCULAR | Status: DC | PRN
  Administered 2011-08-07: 1 mg via INTRAVENOUS

## 2011-08-07 MED ORDER — PHENYLEPHRINE HCL 10 MG/ML IJ SOLN
INTRAMUSCULAR | Status: DC | PRN
  Administered 2011-08-07 (×2): 80 ug via INTRAVENOUS
  Administered 2011-08-07: 40 ug via INTRAVENOUS
  Administered 2011-08-07: 80 ug via INTRAVENOUS

## 2011-08-07 SURGICAL SUPPLY — 87 items
ADH SKN CLS APL DERMABOND .7 (GAUZE/BANDAGES/DRESSINGS) ×4
ADH SKN CLS LQ APL DERMABOND (GAUZE/BANDAGES/DRESSINGS) ×4
APPLIER CLIP 9.375 MED OPEN (MISCELLANEOUS) ×5
APR CLP MED 9.3 20 MLT OPN (MISCELLANEOUS) ×4
ATCH SMKEVC FLXB CAUT HNDSWH (FILTER) ×4 IMPLANT
BAG DECANTER FOR FLEXI CONT (MISCELLANEOUS) ×7 IMPLANT
BINDER BREAST MEDIUM (GAUZE/BANDAGES/DRESSINGS) ×2 IMPLANT
BIOPATCH RED 1 DISK 7.0 (GAUZE/BANDAGES/DRESSINGS) ×10 IMPLANT
BIOPATCH WHT 1IN DISK W/4.0 H (GAUZE/BANDAGES/DRESSINGS) ×2 IMPLANT
BLADE SURG 15 STRL LF DISP TIS (BLADE) ×4 IMPLANT
BLADE SURG 15 STRL SS (BLADE) ×5
CANISTER SUCTION 2500CC (MISCELLANEOUS) ×10 IMPLANT
CHLORAPREP W/TINT 10.5 ML (MISCELLANEOUS) ×3 IMPLANT
CHLORAPREP W/TINT 26ML (MISCELLANEOUS) ×10 IMPLANT
CLIP APPLIE 9.375 MED OPEN (MISCELLANEOUS) ×4 IMPLANT
CLOTH BEACON ORANGE TIMEOUT ST (SAFETY) ×10 IMPLANT
CONT SPEC 4OZ CLIKSEAL STRL BL (MISCELLANEOUS) ×5 IMPLANT
COVER PROBE W GEL 5X96 (DRAPES) ×2 IMPLANT
COVER SURGICAL LIGHT HANDLE (MISCELLANEOUS) ×10 IMPLANT
DECANTER SPIKE VIAL GLASS SM (MISCELLANEOUS) ×5 IMPLANT
DERMABOND ADHESIVE PROPEN (GAUZE/BANDAGES/DRESSINGS) ×1
DERMABOND ADVANCED (GAUZE/BANDAGES/DRESSINGS) ×1
DERMABOND ADVANCED .7 DNX12 (GAUZE/BANDAGES/DRESSINGS) ×7 IMPLANT
DERMABOND ADVANCED .7 DNX6 (GAUZE/BANDAGES/DRESSINGS) ×1 IMPLANT
DRAIN CHANNEL 19F RND (DRAIN) ×12 IMPLANT
DRAPE BILATERAL SPLIT (DRAPES) ×6 IMPLANT
DRAPE ORTHO SPLIT 77X108 STRL (DRAPES) ×10
DRAPE PED LAPAROTOMY (DRAPES) ×3 IMPLANT
DRAPE PROXIMA HALF (DRAPES) ×13 IMPLANT
DRAPE SURG 17X23 STRL (DRAPES) ×10 IMPLANT
DRAPE SURG ORHT 6 SPLT 77X108 (DRAPES) ×8 IMPLANT
DRAPE UTILITY 15X26 W/TAPE STR (DRAPE) ×10 IMPLANT
DRAPE WARM FLUID 44X44 (DRAPE) ×5 IMPLANT
DRESSING TELFA 8X3 (GAUZE/BANDAGES/DRESSINGS) IMPLANT
DRSG OPSITE 6X11 MED (GAUZE/BANDAGES/DRESSINGS) ×2 IMPLANT
DRSG PAD ABDOMINAL 8X10 ST (GAUZE/BANDAGES/DRESSINGS) ×8 IMPLANT
ELECT BLADE 6.5 EXT (BLADE) ×5 IMPLANT
ELECT CAUTERY BLADE 6.4 (BLADE) ×10 IMPLANT
ELECT REM PT RETURN 9FT ADLT (ELECTROSURGICAL) ×10
ELECTRODE REM PT RTRN 9FT ADLT (ELECTROSURGICAL) ×8 IMPLANT
EVACUATOR SILICONE 100CC (DRAIN) ×10 IMPLANT
EVACUATOR SMOKE ACCUVAC VALLEY (FILTER) ×1
GAUZE SPONGE 4X4 16PLY XRAY LF (GAUZE/BANDAGES/DRESSINGS) ×3 IMPLANT
GAUZE XEROFORM 5X9 LF (GAUZE/BANDAGES/DRESSINGS) IMPLANT
GLOVE BIO SURGEON STRL SZ7 (GLOVE) ×4 IMPLANT
GLOVE BIO SURGEON STRL SZ7.5 (GLOVE) ×13 IMPLANT
GLOVE BIOGEL PI IND STRL 7.5 (GLOVE) ×1 IMPLANT
GLOVE BIOGEL PI IND STRL 8 (GLOVE) ×4 IMPLANT
GLOVE BIOGEL PI INDICATOR 7.5 (GLOVE) ×1
GLOVE BIOGEL PI INDICATOR 8 (GLOVE) ×1
GLOVE EUDERMIC 7 POWDERFREE (GLOVE) ×2 IMPLANT
GOWN PREVENTION PLUS XLARGE (GOWN DISPOSABLE) ×5 IMPLANT
GOWN STRL NON-REIN LRG LVL3 (GOWN DISPOSABLE) ×20 IMPLANT
KIT BASIN OR (CUSTOM PROCEDURE TRAY) ×10 IMPLANT
KIT ROOM TURNOVER OR (KITS) ×10 IMPLANT
NDL HYPO 25GX1X1/2 BEV (NEEDLE) ×3 IMPLANT
NEEDLE HYPO 25GX1X1/2 BEV (NEEDLE) ×5 IMPLANT
NS IRRIG 1000ML POUR BTL (IV SOLUTION) ×15 IMPLANT
PACK GENERAL/GYN (CUSTOM PROCEDURE TRAY) ×10 IMPLANT
PACK SURGICAL SETUP 50X90 (CUSTOM PROCEDURE TRAY) ×5 IMPLANT
PAD ARMBOARD 7.5X6 YLW CONV (MISCELLANEOUS) ×10 IMPLANT
PEN SKIN MARKING BROAD (MISCELLANEOUS) ×5 IMPLANT
PENCIL BUTTON HOLSTER BLD 10FT (ELECTRODE) ×5 IMPLANT
PREFILTER EVAC NS 1 1/3-3/8IN (MISCELLANEOUS) ×5 IMPLANT
SET ASEPTIC TRANSFER (MISCELLANEOUS) ×2 IMPLANT
SPECIMEN JAR X LARGE (MISCELLANEOUS) ×5 IMPLANT
SPONGE GAUZE 4X4 12PLY (GAUZE/BANDAGES/DRESSINGS) ×5 IMPLANT
SPONGE LAP 18X18 X RAY DECT (DISPOSABLE) ×2 IMPLANT
STAPLER VISISTAT 35W (STAPLE) ×2 IMPLANT
STRIP CLOSURE SKIN 1/2X4 (GAUZE/BANDAGES/DRESSINGS) ×5 IMPLANT
SUT MNCRL AB 3-0 PS2 18 (SUTURE) ×7 IMPLANT
SUT MNCRL AB 4-0 PS2 18 (SUTURE) ×10 IMPLANT
SUT PDS AB 3-0 SH 27 (SUTURE) ×4 IMPLANT
SUT PROLENE 3 0 PS 2 (SUTURE) ×10 IMPLANT
SUT VIC AB 3-0 54X BRD REEL (SUTURE) IMPLANT
SUT VIC AB 3-0 BRD 54 (SUTURE)
SUT VIC AB 3-0 SH 18 (SUTURE) ×9 IMPLANT
SUT VIC AB 3-0 SH 27 (SUTURE) ×5
SUT VIC AB 3-0 SH 27X BRD (SUTURE) ×4 IMPLANT
SYR BULB IRRIGATION 50ML (SYRINGE) ×10 IMPLANT
SYR CONTROL 10ML LL (SYRINGE) ×5 IMPLANT
TISSUE EXPANDER 450CC (Prosthesis & Implant Plastic) ×4 IMPLANT
TOWEL OR 17X24 6PK STRL BLUE (TOWEL DISPOSABLE) ×10 IMPLANT
TOWEL OR 17X26 10 PK STRL BLUE (TOWEL DISPOSABLE) ×10 IMPLANT
TRAY FOLEY CATH 14FRSI W/METER (CATHETERS) ×2 IMPLANT
TUBE CONNECTING 12X1/4 (SUCTIONS) ×5 IMPLANT
YANKAUER SUCT BULB TIP NO VENT (SUCTIONS) IMPLANT

## 2011-08-07 NOTE — Anesthesia Preprocedure Evaluation (Addendum)
Anesthesia Evaluation  Patient identified by MRN, date of birth, ID band Patient awake    Reviewed: Allergy & Precautions, H&P , NPO status , Patient's Chart, lab work & pertinent test results  History of Anesthesia Complications (+) PONV  Airway Mallampati: I TM Distance: >3 FB Neck ROM: Full    Dental   Pulmonary    Pulmonary exam normal       Cardiovascular     Neuro/Psych Anxiety    GI/Hepatic   Endo/Other    Renal/GU      Musculoskeletal   Abdominal   Peds  Hematology   Anesthesia Other Findings   Reproductive/Obstetrics                         Anesthesia Physical Anesthesia Plan  ASA: II  Anesthesia Plan: General   Post-op Pain Management:    Induction: Intravenous  Airway Management Planned: Oral ETT  Additional Equipment:   Intra-op Plan:   Post-operative Plan: Extubation in OR  Informed Consent: I have reviewed the patients History and Physical, chart, labs and discussed the procedure including the risks, benefits and alternatives for the proposed anesthesia with the patient or authorized representative who has indicated his/her understanding and acceptance.     Plan Discussed with: CRNA and Surgeon  Anesthesia Plan Comments:         Anesthesia Quick Evaluation

## 2011-08-07 NOTE — Anesthesia Postprocedure Evaluation (Signed)
  Anesthesia Post-op Note  Patient: Abigail [name]  Procedure(s) Performed: Procedure(s) (LRB): TISSUE EXPANDER (Bilateral) REMOVAL PORT-A-CATH (Right) MASTECTOMY WITH SENTINEL LYMPH NODE BIOPSY (Bilateral)  Patient Location: PACU  Anesthesia Type: General  Level of Consciousness: awake, alert  and oriented  Airway and Oxygen Therapy: Patient Spontanous Breathing and Patient connected to nasal cannula oxygen  Post-op Pain: none  Post-op Assessment: Post-op Vital signs reviewed  Post-op Vital Signs: Reviewed  Complications: No apparent anesthesia complications

## 2011-08-07 NOTE — H&P (Signed)
Abigail [name]  Description:  46 year old female  06/06/2011 4:30 PM Office Visit Provider:  Robyne Askew, MD  MRN: [id number] Department:  [facility]            Diagnoses  Reason for Visit    Cancer of upper-inner quadrant of female breast - Primary  Follow-up   174.2            Vitals - Last Recorded       BP  Pulse  Temp  Resp  Ht  Wt    101/62  88  98.6 F (37 C) (Temporal)  14  5\' 6"  (1.676 m)  141 lb 12.8 oz (64.32 kg)          BMI               22.89 kg/m2                   Progress Notes     Robyne Askew, MD 06/07/2011 2:26 PM Signed    Subjective:    Patient ID: Abigail [name], female DOB: [birth year], 46 y.o. MRN: [id number]  HPI  The patient is a 46 year old white female who has a known cancer of her left upper and quadrant of her breast. I believe the original size of the tumor was 2.7 cm. She finished her course of neoadjuvant chemotherapy about 2 weeks ago. Her most recent MRI showed that the tumor did shrink some period the MRI also showed a new area on the right. 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 After much consideration she does not feel as though she could go through this whole process again. She is strongly considering bilateral mastectomies and a left sentinel lymph node biopsy.  Review of Systems  Constitutional: Negative.  HENT: Negative.  Eyes: Negative.  Respiratory: Negative.  Cardiovascular: Negative.  Gastrointestinal: Negative.  Genitourinary: Negative.  Musculoskeletal: Negative.  Skin: Negative.  Neurological: Negative.  Hematological: Negative.  Psychiatric/Behavioral: Negative.      Objective:    Physical Exam  Constitutional: She is oriented to person, place, and time. She appears well-developed and well-nourished.  HENT:  Head: Normocephalic and atraumatic.  Eyes: Conjunctivae and EOM are normal. Pupils are equal, round,  and reactive to light.  Neck: Normal range of motion. Neck supple.  Cardiovascular: Normal rate, regular rhythm and normal heart sounds.  Pulmonary/Chest: Effort normal and breath sounds normal.  The mass in the upper inner left breast is no longer palpable. She does have a baseline of diffuse nodular breast tissue bilaterally. No palpable axillary supraclavicular or cervical lymphadenopathy  Abdominal: Soft. Bowel sounds are normal. She exhibits no mass. There is no tenderness.  Musculoskeletal: Normal range of motion.  Lymphadenopathy:  She has no cervical adenopathy.  Neurological: She is alert and oriented to person, place, and time.  Skin: Skin is warm and dry.  Psychiatric: She has a normal mood and affect. Her behavior is normal.      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 We spent a lot of time discussing the different options today. She is strongly favoring bilateral mastectomies and left sentinel lymph node biopsy.     Plan:  At this point we will refer her to Dr. Odis Luster and plastic surgery to talk about options for reconstruction. We will also contact the radiation oncologist to see what the likelihood is that she would need postmastectomy radiation should she choose bilateral mastectomies. Once we have these answers then we will proceed accordingly. I have discussed with her in detail the risks and benefits of the operation to remove both breasts as well as some of the technical aspects and she understands and wishes to proceed              Not recorded                  Discontinued Medications         Reason for Discontinue    doxycycline (VIBRA-TABS) 100 MG tablet  Error    HYDROcodone-acetaminophen (NORCO) 5-325 MG per tablet  Error    lidocaine-prilocaine (EMLA) cream   Error    LORazepam (ATIVAN) 0.5 MG tablet  Error    ondansetron (ZOFRAN) 8 MG tablet  Error    prochlorperazine (COMPAZINE) 10 MG tablet  Error    tobramycin-dexamethasone (TOBRADEX) ophthalmic solution  Error    zolpidem (AMBIEN) 10 MG tablet  Error                 Patient Instructions     Will refer to plastics          Level of Service     PR OFFICE OUTPATIENT VISIT 15 MINUTES [13086]           All Flowsheet Templates (all recorded)     Encounter Vitals Flowsheet   Custom Formula Data Flowsheet   Anthropometrics Flowsheet                           Referring Provider          Bradd Canary, MD            All Charges for This Encounter       Code  Description  Service Date  Service Provider  Modifiers  Quantity    [phone]  PR OFFICE OUTPATIENT VISIT 15 MINUTES  06/06/2011  Robyne Askew, MD   1                Other Encounter Related Information     Allergies & Medications      Problem List      History      Patient-Entered Questionnaires      Printed AVS Reports       Printed at    Printed by    06/06/2011 5:22 PM  After Visit Summary  Robyne Askew, MD           No data filed

## 2011-08-07 NOTE — OR Nursing (Signed)
Dr. Odis Luster procedure start at 1501

## 2011-08-07 NOTE — Interval H&P Note (Signed)
History and Physical Interval Note:  08/07/2011 11:56 AM  Abigail [name]  has presented today for surgery, with the diagnosis of left breast cancer   The various methods of treatment have been discussed with the patient and family. After consideration of risks, benefits and other options for treatment, the patient has consented to  Procedure(s) (LRB): Bilateral MASTECTOMY  WITH RECONSTRUCTION (Bilateral) Sentinel LYMPH NODE BIOPSY (Left) TISSUE EXPANDER (Bilateral) as a surgical intervention .  The patient's history has been reviewed, patient examined, no change in status, stable for surgery.  I have reviewed the patients' chart and labs.  Questions were answered to the patient's satisfaction.     TOTH III,[name]

## 2011-08-07 NOTE — Anesthesia Procedure Notes (Signed)
Procedure Name: Intubation Date/Time: 08/07/2011 12:39 PM Performed by: Sherie Don Pre-anesthesia Checklist: Patient identified, Emergency Drugs available, Suction available, Patient being monitored and Timeout performed Patient Re-evaluated:Patient Re-evaluated prior to inductionOxygen Delivery Method: Circle system utilized Preoxygenation: Pre-oxygenation with 100% oxygen Intubation Type: IV induction Ventilation: Mask ventilation without difficulty Laryngoscope Size: Mac and 3 Grade View: Grade I Tube size: 7.5 mm Number of attempts: 1 Airway Equipment and Method: Stylet Placement Confirmation: ETT inserted through vocal cords under direct vision,  positive ETCO2 and breath sounds checked- equal and bilateral Secured at: 21 cm Tube secured with: Tape Dental Injury: Teeth and Oropharynx as per pre-operative assessment

## 2011-08-07 NOTE — Transfer of Care (Signed)
Immediate Anesthesia Transfer of Care Note  Patient: Abigail [name]  Procedure(s) Performed: Procedure(s) (LRB): TISSUE EXPANDER (Bilateral) REMOVAL PORT-A-CATH (Right) MASTECTOMY WITH SENTINEL LYMPH NODE BIOPSY (Bilateral)  Patient Location: PACU  Anesthesia Type: General  Level of Consciousness: awake, alert  and oriented  Airway & Oxygen Therapy: Patient Spontanous Breathing and Patient connected to nasal cannula oxygen  Post-op Assessment: Report given to PACU RN and Post -op Vital signs reviewed and stable  Post vital signs: Reviewed and stable  Complications: No apparent anesthesia complications

## 2011-08-07 NOTE — Preoperative (Signed)
Beta Blockers   Reason not to administer Beta Blockers:Not Applicable 

## 2011-08-07 NOTE — OR Nursing (Signed)
1st procedure ended at 1432.

## 2011-08-07 NOTE — Brief Op Note (Signed)
08/07/2011  5:06 PM  PATIENT:  Abigail [name]  46 y.o. female  PRE-OPERATIVE DIAGNOSIS:  left breast cancer   POST-OPERATIVE DIAGNOSIS:  left breast cancer   PROCEDURE:  Procedure(s) (LRB): TISSUE EXPANDER (Bilateral) for breast reconstruction REMOVAL PORT-A-CATH (Right) MASTECTOMY WITH SENTINEL LYMPH NODE BIOPSY (Bilateral)  SURGEON:  Surgeon(s) and Role: Panel 1:    * Robyne Askew, MD - Primary  Panel 2:    * Etter Sjogren, MD - Primary  PHYSICIAN ASSISTANT:   ASSISTANTS: none   ANESTHESIA:   general  EBL:  Total I/O In: 3600 [I.V.:3600] Out: 1700 [Urine:1550; Blood:150]  BLOOD ADMINISTERED:none  DRAINS: (3) Jackson-Pratt drain(s) with closed bulb suction in the mastectomy sites (1 right and 2 left)   LOCAL MEDICATIONS USED:  NONE  SPECIMEN:  No Specimen  DISPOSITION OF SPECIMEN:  N/A  COUNTS:  YES  TOURNIQUET:  * No tourniquets in log *  DICTATION: .Other Dictation: Dictation Number [id number]  PLAN OF CARE: Admit to inpatient   PATIENT DISPOSITION:  PACU - hemodynamically stable.   Delay start of Pharmacological VTE agent (>24hrs) due to surgical blood loss or risk of bleeding: yes

## 2011-08-07 NOTE — Progress Notes (Signed)
ANTIBIOTIC CONSULT NOTE - INITIAL  Pharmacy Consult for Vancomycin Indication: Surgical prophylaxis following bilateral mastectomy.  Allergies  Allergen Reactions  . Penicillins Rash    Patient Measurements: Height: 5\' 6"  (167.6 cm) Weight: 135 lb 12.9 oz (61.6 kg) IBW/kg (Calculated) : 59.3  Adjusted Body Weight:   Vital Signs: Temp: 97.9 F (36.6 C) (07/10 1830) Temp src: Oral (07/10 1830) BP: 135/61 mmHg (07/10 1830) Pulse Rate: 69  (07/10 1830) Intake/Output from previous day:   Intake/Output from this shift:    Labs: No results found for this basename: WBC:3,HGB:3,PLT:3,LABCREA:3,CREATININE:3 in the last 72 hours Estimated Creatinine Clearance: 83.1 ml/min (by C-G formula based on Cr of 0.73). No results found for this basename: VANCOTROUGH:2,VANCOPEAK:2,VANCORANDOM:2,GENTTROUGH:2,GENTPEAK:2,GENTRANDOM:2,TOBRATROUGH:2,TOBRAPEAK:2,TOBRARND:2,AMIKACINPEAK:2,AMIKACINTROU:2,AMIKACIN:2, in the last 72 hours   Microbiology: Recent Results (from the past 720 hour(s))  SURGICAL PCR SCREEN     Status: Normal   Collection Time   07/31/11 12:02 PM      Component Value Range Status Comment   MRSA, PCR NEGATIVE  NEGATIVE Final    Staphylococcus aureus NEGATIVE  NEGATIVE Final     Medical History: Past Medical History  Diagnosis Date  . Chicken pox as a child  . Allergy     seasonal- spring  . Urticaria   . History of streptococcal pharyngitis   . History of mononucleosis   . Anemia     mild  . Allergic state 12/04/2010  . Preventative health care 12/04/2010  . Anxiety 02/11/2011  . Insomnia 02/11/2011  . PONV (postoperative nausea and vomiting)   . 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 S/P chemotherapy, time since less than 4 weeks 05/23/11    completed 4 doses docetaxel/cyclophosphamide  .  Hot flashes   . Family history of anesthesia complication     Pt adopted    Medications:  Scheduled:    . docusate sodium  100 mg Oral Daily  . gabapentin  300 mg Oral Q24H  . HYDROmorphone PCA 0.3 mg/mL   Intravenous Q4H  . HYDROmorphone PCA 0.3 mg/mL      . vancomycin  1,000 mg Intravenous 120 min pre-op  . vancomycin  1,000 mg Intravenous Q12H  . DISCONTD: chlorhexidine  1 application Topical Once  . DISCONTD: chlorhexidine  1 application Topical Once  . DISCONTD: gabapentin  300 mg Oral 1 day or 1 dose  . DISCONTD: scopolamine  1 patch Transdermal Once   Assessment: 46 yr old female admitted for bilateral mastectomy and reconstruction  Following diagnosis of breast cancer with a BRCA mutation. She has been treated with chemotherapy and Tamoxifen.  Goal of Therapy:  Vancomycin trough level 10-15 mcg/ml  Plan:  Pt received Vancomycin 1 Gm in the OR. Will continue with 1 Gm Vancomycin IV q12hrs. MD did not specify length of treatment.  Eugene Garnet 08/07/2011,7:11 PM

## 2011-08-07 NOTE — Op Note (Signed)
08/07/2011  3:06 PM  PATIENT:  Abigail [name]  46 y.o. female  PRE-OPERATIVE DIAGNOSIS:  left breast cancer   POST-OPERATIVE DIAGNOSIS:  left breast cancer   PROCEDURE:  Procedure(s) (LRB): TISSUE EXPANDER (Bilateral) REMOVAL PORT-A-CATH (Right) Bilateral MASTECTOMY WITH Left SENTINEL LYMPH NODE BIOPSY  SURGEON:  Surgeon(s) and Role: Panel 1:    * Robyne Askew, MD - Primary  Panel 2:    * Etter Sjogren, MD - Primary  PHYSICIAN ASSISTANT:   ASSISTANTS: Dr. Jamey Ripa   ANESTHESIA:   general  EBL:  Total I/O In: 3200 [I.V.:3200] Out: 1150 [Urine:1000; Blood:150]  BLOOD ADMINISTERED:none  DRAINS: none   LOCAL MEDICATIONS USED:  NONE  SPECIMEN:  Source of Specimen:  bilateral breast and left sentinel lymph nodes X 2  DISPOSITION OF SPECIMEN:  PATHOLOGY  COUNTS:  YES  TOURNIQUET:  * No tourniquets in log *  DICTATION: .Dragon Dictation After informed consent was obtained the patient was brought to the operating room and placed in the supine position on the operating table. After adequate induction of general anesthesia the patient's bilateral chest, breast, and axillary areas were prepped with DuraPrep, allowed to dry, and draped in the usual sterile manner. Earlier in the day the patient underwent injection of 1 mCi of technetium sulfur colloid in the subareolar position the left breast. At this point, 2 cc of methylene blue and 3 cc of injectable saline were also injected in the subareolar position of the left breast. Elliptical incisions were then mapped out around the nipple areola complex in order to minimize the amount of skin removed. Attention was first turned to the right breast. The incision was made along the mapped lines. The incision was carried through the skin and subcutaneous tissue sharply with the electrocautery. Breast hooks were used to elevate the skin flaps anteriorly towards the ceiling. Thin skin flaps were created between the breast tissue and  subcutaneous fat. This was done circumferentially until the dissection up the chest wall. Once this was accomplished and the breast was removed from the pectoralis muscle the chest wall with the pectoralis fascia. Once this was accomplished the breast was removed from the patient. A stitch was used to mark the lateral aspect of the right breast. Hemostasis was achieved using the Bovie electrocautery. The wound wound was then packed with a moistened lap sponge. Attention was then turned to the left breast. A similar incision was made along the mapped lines with a 10 blade knife. The incision was carried through the skin and subcutaneous tissue sharply with the electrocautery. Breast which were then used to elevate the skin flaps anteriorly towards the ceiling. The dissection was carried out between the breast tissue and subcutaneous fat circumferentially until the dissection at the chest wall. Laterally the neoprobe was used to identify a hot spot in the left axilla. Using blunt hemostat dissection a hot blue lymph node was identified. This was excised sharply with the electrocautery and then the lymphatics were clamped with hemostats, divided, and ligated with 3-0 Vicryl ties. Ex vivo counts on this lymph node were round 2600. A second smaller lymph node was also identified that was hot. It was excised sharply with the electrocautery. Ex vivo counts on this node were around 300. This was sent as sentinel node #2. No other hot blue were palpable lymph nodes were identified in the left axilla. Touch preps of these lymph nodes were negative. At this point the breast was then removed from the pectoralis muscle  the chest wall the pectoralis fascia. This was done sharply with the electrocautery. Stitch was used to mark the lateral aspect of the left breast. The breast was then removed and the patient sent to pathology for further evaluation. Hemostasis was achieved using the Bovie electrocautery. The wound was then  irrigated with copious amounts of saline and packed with a moistened lap sponge. The skin flaps on both incisions were healthy appearing. At this point a small incision was made overlying the port on the upper right chest wall. The incision was then opened with electrocautery until the port was identified. The 2 anchoring stitches were removed sharply with a 15 blade knife and hemostat. The port was then pushed out of its pocket and removed with gentle traction without difficulty. The deep layer the wound was closed with interrupted 3-0 Vicryl stitches. The skin was closed with interrupted 4-0 Monocryl subcuticular stitches. A Dermabond dressing was applied. The patient tolerated the procedure well. At the end of the case the needle sponge and sharp counts were correct. At this point the case was turned over to Dr. Odis Luster for the reconstruction. His portion will be dictated separately  PLAN OF CARE: Admit for overnight observation  PATIENT DISPOSITION:  PACU - hemodynamically stable.   Delay start of Pharmacological VTE agent (>24hrs) due to surgical blood loss or risk of bleeding: yes

## 2011-08-07 NOTE — Anesthesia Postprocedure Evaluation (Signed)
  Anesthesia Post-op Note  Patient: Abigail [name]  Procedure(s) Performed: Procedure(s) (LRB): TISSUE EXPANDER (Bilateral) REMOVAL PORT-A-CATH (Right) MASTECTOMY WITH SENTINEL LYMPH NODE BIOPSY (Bilateral)  Patient Location: PACU  Anesthesia Type: General  Level of Consciousness: awake, alert  and oriented  Airway and Oxygen Therapy: Patient Spontanous Breathing and Patient connected to nasal cannula oxygen  Post-op Pain: none  Post-op Assessment: Post-op Vital signs reviewed  Post-op Vital Signs: Reviewed  Complications: No apparent anesthesia complications 

## 2011-08-08 ENCOUNTER — Encounter (HOSPITAL_COMMUNITY): Payer: Self-pay | Admitting: General Surgery

## 2011-08-08 ENCOUNTER — Telehealth (INDEPENDENT_AMBULATORY_CARE_PROVIDER_SITE_OTHER): Payer: Self-pay | Admitting: General Surgery

## 2011-08-08 MED ORDER — HYDROCODONE-ACETAMINOPHEN 5-325 MG PO TABS
1.0000 | ORAL_TABLET | ORAL | Status: DC | PRN
  Administered 2011-08-08 – 2011-08-09 (×3): 2 via ORAL
  Filled 2011-08-08 (×3): qty 2

## 2011-08-08 MED ORDER — DIPHENHYDRAMINE HCL 25 MG PO CAPS
25.0000 mg | ORAL_CAPSULE | Freq: Four times a day (QID) | ORAL | Status: DC | PRN
  Administered 2011-08-08 – 2011-08-09 (×4): 25 mg via ORAL
  Filled 2011-08-08 (×4): qty 1

## 2011-08-08 MED ORDER — DIPHENHYDRAMINE HCL 50 MG/ML IJ SOLN: 25.0000 mg | Freq: Four times a day (QID) | INTRAMUSCULAR | Status: DC | PRN

## 2011-08-08 MED ORDER — ONDANSETRON HCL 4 MG/2ML IJ SOLN: 4.0000 mg | Freq: Four times a day (QID) | INTRAMUSCULAR | Status: DC | PRN

## 2011-08-08 MED ORDER — DIPHENHYDRAMINE HCL 50 MG/ML IJ SOLN
25.0000 mg | Freq: Four times a day (QID) | INTRAMUSCULAR | Status: DC | PRN
Start: 2011-08-08 — End: 2011-08-08

## 2011-08-08 MED ORDER — METHOCARBAMOL 500 MG PO TABS
500.0000 mg | ORAL_TABLET | Freq: Four times a day (QID) | ORAL | Status: DC
  Administered 2011-08-08 – 2011-08-09 (×2): 500 mg via ORAL
  Filled 2011-08-08 (×8): qty 1

## 2011-08-08 MED ORDER — HYDROMORPHONE HCL 2 MG PO TABS
2.0000 mg | ORAL_TABLET | ORAL | Status: DC | PRN
  Administered 2011-08-08 (×2): 4 mg via ORAL
  Filled 2011-08-08 (×2): qty 2

## 2011-08-08 MED ORDER — ACETAMINOPHEN 500 MG PO TABS
1000.0000 mg | ORAL_TABLET | Freq: Four times a day (QID) | ORAL | Status: DC | PRN
  Administered 2011-08-08: 1000 mg via ORAL
  Filled 2011-08-08: qty 2

## 2011-08-08 NOTE — Telephone Encounter (Signed)
LMOM letting pt know that her first PO appt with Dr. Carolynne Edouard will be on 7/18 at 4:10.  I also sent a reminder card in the mail.

## 2011-08-08 NOTE — Progress Notes (Signed)
UR complete 

## 2011-08-08 NOTE — Op Note (Signed)
NAMELOELLA, HICKLE NO.:  [id number]  MEDICAL RECORD NO.:  [id number]  LOCATION:  [id number]                        FACILITY:  [facility]  PHYSICIAN:  Etter Sjogren, M.D.     DATE OF BIRTH:  [birth year]  DATE OF PROCEDURE:  08/07/2011 DATE OF DISCHARGE:                              OPERATIVE REPORT   PREOPERATIVE DIAGNOSIS:  Breast cancer.  POSTOPERATIVE DIAGNOSIS:  Breast cancer.  PROCEDURE PERFORMED:  Bilateral breast reconstruction with tissue expanders.  SURGEON:  Etter Sjogren, MD  ANESTHESIA:  General.  ESTIMATED BLOOD LOSS:  15 mL.  DRAINS:  One 19-French on the right, two 19-French on the left.  CLINICAL NOTE:  A 46 year old woman has breast cancer.  She is having bilateral mastectomy.  She has understood reconstruction.  Option was discussed and she selected placement of tissue expanders at stage procedure for eventual placement of the implants.  The nature of procedure, risks, possible complications were discussed with her.  These including, but were not limited to bleeding, infection, anesthesia related complications, healing problems, scarring, fluid accumulations, loss of sensation, pneumothorax, pulmonary embolism, failure of the device, capsular contracture, displacement of device, wrinkles, ripples, disappointment, asymmetry, secondary procedures, loss of skin of the mastectomy skin flaps, and she understood all of these and wished to proceed.  DESCRIPTION OF PROCEDURE:  The patient was in the operating room and bowel mastectomy had been completed.  The wounds were irrigated thoroughly with saline.  Hemostasis was achieved using electrocautery. The dissection was begun at the lateral border of the pectoralis major muscle.  Submuscular space was developed including a portion of serratus anterior laterally.  These submuscular spaces were developed down to the level of the inframammary crease that had been marked preoperatively.  A thorough  irrigation with saline.  Meticulous hemostasis was again with the electrocautery.  Great care was taken throughout the dissections to avoid any damage to underlying chest cavity.  As hemostasis having been confirmed, an antibiotic solution placed, the expanders were prepared after thoroughly cleaning gloves.  These were Mentor 450 mL tissue expanders, 50 mL of sterile saline placed using a closed filling system and all the air removed.  The expanders were soaked in antibiotic solution.  The expanders were positioned after removing all the air in them and the suture tab laterally was secured with a 3-0 Vicryl to the chest wall.  This maintained orientation of the tissue expanders. Again, antibiotic solution was placed at the closure of the muscle with 3-0 Vicryl simple interrupted and interrupted figure-of-eight sutures. Irrigation with saline and antibiotic solution.  Meticulous hemostasis of the mastectomy space with electrocautery and then, a 19-French drain was positioned on the right, brought through separate stab wound inferolaterally and secured with a 3-0 Prolene suture, and two drains on the left, one of them placed in the bed where the sentinel lymph node was biopsied and then the other underneath the mastectomy flaps and these also were secured with 3-0 Prolene sutures and were brought out inferolaterally.  The skin flaps continued to have excellent color, bright red bleeding along the periphery consistent with viability.  The skin closures with 3-0 Monocryl interrupted inverted deep dermal sutures and Dermabond.  The drain dressings with Biopatch and Tegaderm, and ABDs and the chest vest placed and she was transferred to the recovery room in stable having tolerated the procedure well.  She will be observed overnight.     Etter Sjogren, M.D.     DB/MEDQ  D:  08/07/2011  T:  08/08/2011  Job:  [id number]

## 2011-08-08 NOTE — Progress Notes (Signed)
Subjective: Sore but good pain control.  Objective: Vital signs in last 24 hours: Temp:  [97.4 F (36.3 C)-99.1 F (37.3 C)] 99.1 F (37.3 C) (07/11 0524) Pulse Rate:  [68-107] 74  (07/11 0524) Resp:  [12-18] 15  (07/11 0524) BP: (113-140)/(53-72) 140/66 mmHg (07/11 0524) SpO2:  [99 %-100 %] 100 % (07/11 0524) Weight:  [135 lb 12.8 oz (61.598 kg)-135 lb 12.9 oz (61.6 kg)] 135 lb 12.9 oz (61.6 kg) (07/10 1900)  Intake/Output from previous day: 07/10 0701 - 07/11 0700 In: 4825 [I.V.:4825] Out: 2943 [Urine:2700; Drains:93; Blood:150] Intake/Output this shift:    Operative sites: Mastectomy flaps viable. Good color bilateral. Tissue expanders in good position. Drains functioning. Drainage thin.  No results found for this basename: WBC:2,HGB:2,HCT:2,PLATELETS:2,NA:2,K:2,CL:2,CO2:2,BUN:2,CREATININE:2,GLU:2 in the last 72 hours  Studies/Results: Nm Sentinel Node Inj-no Rpt (breast)  08/07/2011  CLINICAL DATA: left breast cancer   Sulfur colloid was injected intradermally by the nuclear medicine  technologist for breast cancer sentinel node localization.      Assessment/Plan: D/C Foley. Ambulate.PO pain med.  LOS: 1 day    Monica Codd M 08/08/2011 8:30 AM

## 2011-08-08 NOTE — Progress Notes (Signed)
1 Day Post-Op  Subjective: Still having a lot of pain requiring IV pain meds. Nausea improving. Also has a headache  Objective: Vital signs in last 24 hours: Temp:  [97.4 F (36.3 C)-99.1 F (37.3 C)] 99.1 F (37.3 C) (07/11 0524) Pulse Rate:  [68-107] 74  (07/11 0524) Resp:  [12-18] 15  (07/11 0524) BP: (113-140)/(53-72) 140/66 mmHg (07/11 0524) SpO2:  [99 %-100 %] 100 % (07/11 0524) Weight:  [135 lb 12.8 oz (61.598 kg)-135 lb 12.9 oz (61.6 kg)] 135 lb 12.9 oz (61.6 kg) (07/10 1900) Last BM Date: 08/06/11  Intake/Output from previous day: 07/10 0701 - 07/11 0700 In: 4743.3 [I.V.:4743.3] Out: 2943 [Urine:2700; Drains:93; Blood:150] Intake/Output this shift:    Chest wall: skin flaps look healthy. drain ouput serosanguinous  Lab Results:  No results found for this basename: WBC:2,HGB:2,HCT:2,PLT:2 in the last 72 hours BMET No results found for this basename: NA:2,K:2,CL:2,CO2:2,GLUCOSE:2,BUN:2,CREATININE:2,CALCIUM:2 in the last 72 hours PT/INR No results found for this basename: LABPROT:2,INR:2 in the last 72 hours ABG No results found for this basename: PHART:2,PCO2:2,PO2:2,HCO3:2 in the last 72 hours  Studies/Results: Nm Sentinel Node Inj-no Rpt (breast)  08/07/2011  CLINICAL DATA: left breast cancer   Sulfur colloid was injected intradermally by the nuclear medicine  technologist for breast cancer sentinel node localization.      Anti-infectives: Anti-infectives     Start     Dose/Rate Route Frequency Ordered Stop   08/07/11 2200   vancomycin (VANCOCIN) IVPB 1000 mg/200 mL premix        1,000 mg 200 mL/hr over 60 Minutes Intravenous Every 12 hours 08/07/11 1909     08/07/11 1312   polymyxin B 500,000 Units, bacitracin 50,000 Units in sodium chloride irrigation 0.9 % 500 mL irrigation  Status:  Discontinued          As needed 08/07/11 1313 08/07/11 1701   08/06/11 1431   vancomycin (VANCOCIN) IVPB 1000 mg/200 mL premix        1,000 mg 200 mL/hr over 60 Minutes  Intravenous 120 min pre-op 08/06/11 1431 08/07/11 1220          Assessment/Plan: s/p Procedure(s) (LRB): TISSUE EXPANDER (Bilateral) REMOVAL PORT-A-CATH (Right) MASTECTOMY WITH SENTINEL LYMPH NODE BIOPSY (Bilateral) d/c foley Advance diet Continue to work on pain control Hopefully ready for discharge tomorrow  LOS: 1 day    TOTH III,Shanteria Laye S 08/08/2011

## 2011-08-09 MED ORDER — HYDROCODONE-ACETAMINOPHEN 10-325 MG PO TABS: 1.0000 | ORAL_TABLET | ORAL | Status: AC | PRN

## 2011-08-09 MED ORDER — METHOCARBAMOL 500 MG PO TABS: 500.0000 mg | ORAL_TABLET | Freq: Four times a day (QID) | ORAL | Status: AC | PRN

## 2011-08-09 MED ORDER — HYDROCODONE-ACETAMINOPHEN 10-325 MG PO TABS
1.0000 | ORAL_TABLET | ORAL | Status: DC | PRN
  Administered 2011-08-09: 1 via ORAL
  Administered 2011-08-09: 2 via ORAL
  Filled 2011-08-09 (×2): qty 2

## 2011-08-09 MED ORDER — OXYCODONE-ACETAMINOPHEN 5-325 MG PO TABS
1.0000 | ORAL_TABLET | ORAL | Status: DC | PRN
Start: 2011-08-09 — End: 2011-08-09
  Administered 2011-08-09 (×2): 2 via ORAL
  Filled 2011-08-09 (×2): qty 2

## 2011-08-09 MED ORDER — DSS 100 MG PO CAPS: 100.0000 mg | ORAL_CAPSULE | Freq: Two times a day (BID) | ORAL | Status: AC

## 2011-08-09 NOTE — Discharge Summary (Signed)
Physician Discharge Summary  Patient ID: Abigail [name] MRN: [id number] DOB/AGE: 1965-04-28 45 y.o.  Admit date: 08/07/2011 Discharge date: 08/09/2011  Admission Diagnoses:Breast cancer  Discharge Diagnoses: Same Active Problems:  * No active hospital problems. *    Discharged Condition: good  Hospital Course: On the day of admission the patient was taken to surgery and had bilateral mastectomy, sentinel node on left, and bilateral reconstruction with tissue expanders. The patient tolerated the procedures well. Postoperatively, the mastectomy skin flaps maintained excellent color and capillary refill. The patient was ambulatory and tolerating diet on the first postoperative day. She had a problem with itching from pain meds but had good pain control. Several pain meds tried and Norco 10/325 worked well with less itching.  Treatments: antibiotics: vancomycin, anticoagulation: none and surgery: bilateral mastectomy, left sentinel node, and bilateral breast reconstruction with tissue expanders.  Discharge Exam: Blood pressure 149/72, pulse 84, temperature 98.5 F (36.9 C), temperature source Oral, resp. rate 18, height 5\' 6"  (1.676 m), weight 135 lb 12.9 oz (61.6 kg), SpO2 97.00%.  Operative sites: Mastectomy flaps viable. Excellent color throughout. Tissue expanders in good position. Drains functioning. Drainage thin. No evidence of bleeding or infection.  Disposition: 06-Home-Health Care Svc  Discharge Orders    Future Appointments: Provider: Department: Dept Phone: Center:   08/15/2011 4:10 PM Robyne Askew, MD [facility] [phone] None   10/29/2011 9:30 AM Dava Najjar Idelle Jo [facility] [phone] None   10/29/2011 10:00 AM Lowella Dell, MD [facility] [phone] None     Medication List  As of 08/09/2011  3:39 PM   ASK your doctor about these medications         acetaminophen 500 MG tablet   Commonly known as: TYLENOL   Take 1,000 mg by mouth every 6 (six)  hours as needed. For pain      CALCIUM + D PO   Take 1 tablet by mouth 2 (two) times daily.      gabapentin 300 MG capsule   Commonly known as: NEURONTIN   Take 300 mg by mouth 1 day or 1 dose.      IRON PO   Take 1 tablet by mouth daily.      multivitamin with minerals Tabs   Take 1 tablet by mouth daily.      tamoxifen 20 MG tablet   Commonly known as: NOLVADEX   Take 20 mg by mouth daily.             SignedOdis Luster, Gaelan Glennon M 08/09/2011, 3:39 PM

## 2011-08-09 NOTE — Progress Notes (Signed)
Subjective: Sore. Had itching with Dilaudid and Norco not strong enough. Now on Percocet. She is not sure how that will work. No nausea today.  Objective: Vital signs in last 24 hours: Temp:  [98.2 F (36.8 C)-99 F (37.2 C)] 99 F (37.2 C) (07/12 0611) Pulse Rate:  [76-100] 100  (07/12 0611) Resp:  [16-18] 18  (07/12 0611) BP: (131-153)/(57-93) 153/93 mmHg (07/12 0611) SpO2:  [90 %-100 %] 92 % (07/12 0611)  Intake/Output from previous day: 07/11 0701 - 07/12 0700 In: 823.3 [I.V.:783.3] Out: 2270 [Urine:2250; Drains:20] Intake/Output this shift:    Operative sites: Mastectomy flaps have excellent color and are viable. Tissue expanders are in good position. Drains functioning. Drainage thin. There is no evidence of hematoma or bleeding or infection.  No results found for this basename: WBC:2,HGB:2,HCT:2,PLATELETS:2,NA:2,K:2,CL:2,CO2:2,BUN:2,CREATININE:2,GLU:2 in the last 72 hours  Studies/Results: Nm Sentinel Node Inj-no Rpt (breast)  08/07/2011  CLINICAL DATA: left breast cancer   Sulfur colloid was injected intradermally by the nuclear medicine  technologist for breast cancer sentinel node localization.      Assessment/Plan: See if Percocet provides pain relief without side effects. Saline lock IV. Discharge later today or tomorrow.  LOS: 2 days    Etter Sjogren M 08/09/2011 8:13 AM

## 2011-08-15 ENCOUNTER — Encounter (INDEPENDENT_AMBULATORY_CARE_PROVIDER_SITE_OTHER): Payer: Self-pay | Admitting: General Surgery

## 2011-08-15 ENCOUNTER — Ambulatory Visit (INDEPENDENT_AMBULATORY_CARE_PROVIDER_SITE_OTHER): Payer: BC Managed Care – PPO | Admitting: General Surgery

## 2011-08-15 VITALS — BP 106/72 | HR 88 | Temp 97.4°F | Resp 14 | Ht 66.0 in | Wt 132.6 lb

## 2011-08-15 DIAGNOSIS — C50219 Malignant neoplasm of upper-inner quadrant of unspecified female breast: Secondary | ICD-10-CM

## 2011-08-15 NOTE — Progress Notes (Signed)
Subjective:     Patient ID: Abigail [name], female   DOB: Dec 08, 1965, 46 y.o.   MRN: [id number]  HPI The patient is a 46 year old white female who is 8 days status post bilateral mastectomies and negative left sentinel lymph node biopsy for a T1 C. N0 left breast cancer. She did receive neoadjuvant chemotherapy. She has tolerated the operation well. She has soreness but it is controllable. She has already seen Dr. Odis Luster who is removed a couple of her drains.  Review of Systems     Objective:   Physical Exam On exam both of her mastectomy incisions are healing nicely. Her skin flaps are very healthy. There is no area of necrosis.    Assessment:     8 days status post bilateral mastectomies and negative left sentinel node biopsy    Plan:     At this point she is doing very well. She will continue to follow up closely with Dr. Odis Luster to manage the drains. We will plan to see her back in one month to check her progress.

## 2011-09-19 ENCOUNTER — Ambulatory Visit (INDEPENDENT_AMBULATORY_CARE_PROVIDER_SITE_OTHER): Payer: BC Managed Care – PPO | Admitting: General Surgery

## 2011-09-19 ENCOUNTER — Encounter (INDEPENDENT_AMBULATORY_CARE_PROVIDER_SITE_OTHER): Payer: Self-pay | Admitting: General Surgery

## 2011-09-19 VITALS — BP 98/70 | HR 84 | Temp 96.9°F | Resp 18 | Ht 66.0 in | Wt 134.6 lb

## 2011-09-19 DIAGNOSIS — C50219 Malignant neoplasm of upper-inner quadrant of unspecified female breast: Secondary | ICD-10-CM

## 2011-09-19 NOTE — Progress Notes (Signed)
Subjective:     Patient ID: Abigail [name], female   DOB: [birth year], 46 y.o.   MRN: [id number]  HPI The patient is a 46 year old white female who is about a month and a half out from bilateral mastectomies and a negative left sentinel node biopsy for a T1 C. N0 left breast cancer. She is done over drains out and actually been expanded twice. She is tolerating this very well. She has no complaints today. She is ready to go back to work next week.  Review of Systems     Objective:   Physical Exam On exam both mastectomy incisions are healing nicely. Her skin flaps are very healthy. She has no palpable mass of either reconstructed breast. No palpable axillary or supraclavicular cervical lymphadenopathy.    Assessment:     1-1/2 months status post bilateral mastectomies with reconstruction    Plan:     At this point she is doing very well. We will plan to see her back in about 5 months. She will be seeing Dr. Darnelle Catalan in 2 months.she will continue to follow with Dr. Odis Luster for expansions

## 2011-09-19 NOTE — Patient Instructions (Signed)
Continue regular self exams  

## 2011-09-24 ENCOUNTER — Ambulatory Visit: Payer: BC Managed Care – PPO | Admitting: Oncology

## 2011-10-03 ENCOUNTER — Ambulatory Visit (INDEPENDENT_AMBULATORY_CARE_PROVIDER_SITE_OTHER): Payer: BC Managed Care – PPO | Admitting: Family Medicine

## 2011-10-03 ENCOUNTER — Encounter: Payer: Self-pay | Admitting: Family Medicine

## 2011-10-03 ENCOUNTER — Other Ambulatory Visit (HOSPITAL_COMMUNITY)
Admission: RE | Admit: 2011-10-03 | Discharge: 2011-10-03 | Disposition: A | Discharge status: Home / Self Care | Payer: BC Managed Care – PPO | Source: Ambulatory Visit | Attending: Family Medicine | Admitting: Family Medicine

## 2011-10-03 VITALS — BP 100/71 | HR 89 | Temp 97.4°F | Ht 66.0 in | Wt 134.4 lb

## 2011-10-03 DIAGNOSIS — R300 Dysuria: Secondary | ICD-10-CM

## 2011-10-03 DIAGNOSIS — R5383 Other fatigue: Secondary | ICD-10-CM

## 2011-10-03 DIAGNOSIS — F411 Generalized anxiety disorder: Secondary | ICD-10-CM

## 2011-10-03 DIAGNOSIS — N760 Acute vaginitis: Secondary | ICD-10-CM | POA: Insufficient documentation

## 2011-10-03 DIAGNOSIS — C50219 Malignant neoplasm of upper-inner quadrant of unspecified female breast: Secondary | ICD-10-CM

## 2011-10-03 DIAGNOSIS — F419 Anxiety disorder, unspecified: Secondary | ICD-10-CM

## 2011-10-03 DIAGNOSIS — R5381 Other malaise: Secondary | ICD-10-CM

## 2011-10-03 LAB — RENAL FUNCTION PANEL
BUN: 24 mg/dL — ABNORMAL HIGH (ref 6–23)
CO2: 28 mEq/L (ref 19–32)
Calcium: 9.7 mg/dL (ref 8.4–10.5)
Creatinine, Ser: 0.8 mg/dL (ref 0.4–1.2)

## 2011-10-03 LAB — CBC
HCT: 36.5 % (ref 36.0–46.0)
Hemoglobin: 11.9 g/dL — ABNORMAL LOW (ref 12.0–15.0)
MCV: 91 fl (ref 78.0–100.0)
RDW: 12.9 % (ref 11.5–14.6)

## 2011-10-03 MED ORDER — FLUCONAZOLE 150 MG PO TABS: ORAL_TABLET | ORAL | Status: DC

## 2011-10-03 MED ORDER — METRONIDAZOLE 500 MG PO TABS: 500.0000 mg | ORAL_TABLET | Freq: Three times a day (TID) | ORAL | Status: AC

## 2011-10-03 NOTE — Patient Instructions (Addendum)
Vaginitis Vaginitis is an infection. It causes soreness, swelling, and redness (inflammation) of the vagina. Many of these infections are sexually transmitted diseases (STDs). Having unprotected sex can cause further problems and complications such as:  Chronic pelvic pain.   Infertility.   Unwanted pregnancy.   Abortion.   Tubal pregnancy.   Infection passed on to the newborn.   Cancer.  CAUSES   Monilia. This is a yeast or fungus infection, not an STD.   Bacterial vaginosis. The normal balance of bacteria in the vagina is disrupted and is replaced by an overgrowth of certain bacteria.   Gonorrhea, chlamydia. These are bacterial infections that are STDs.   Vaginal sponges, diaphragms, and intrauterine devices.   Trichomoniasis. This is a STD infection caused by a parasite.   Viruses like herpes and human papillomavirus. Both are STDs.   Pregnancy.   Immunosuppression. This occurs with certain conditions such as HIV infection or cancer.   Using bubble bath.   Taking certain antibiotic medicines.   Sporadic recurrence can occur if you become sick.   Diabetes.   Steroids.   Allergic reaction. If you have an allergy to:   Douches.   Soaps.   Spermicides.   Condoms.   Scented tampons or vaginal sprays.  SYMPTOMS   Abnormal vaginal discharge.   Itching of the vagina.   Pain in the vagina.   Swelling of the vagina.  In some cases, there are no symptoms. TREATMENT  Treatment will vary depending on the type of infection.  Bacteria or trichomonas are usually treated with oral antibiotics and sometimes vaginal cream or suppositories.   Monilia vaginitis is usually treated with vaginal creams, suppositories, or oral antifungal pills.   Viral vaginitis has no cure. However, the symptoms of herpes (a viral vaginitis) can be treated to relieve the discomfort. Human papillomavirus has no symptoms. However, there are treatments for the diseases caused by human  papillomavirus.   With allergic vaginitis, you need to stop using the product that is causing the problem. Vaginal creams can be used to treat the symptoms.   When treating an STD, the sex partner should also be treated.  HOME CARE INSTRUCTIONS   Take all the medicines as directed by your caregiver.   Do not use scented tampons, soaps, or vaginal sprays.   Do not douche.   Tell your sex partner if you have a vaginal infection or an STD.   Do not have sexual intercourse until you have treated the vaginitis.   Practice safe sex by using condoms.  SEEK MEDICAL CARE IF:   You have abdominal pain.   Your symptoms get worse during treatment.  Document Released: 11/11/2006 Document Revised: 01/03/2011 Document Reviewed: 07/07/2008 [facility] Patient Information 2012 [place], [place].   Start a probiotic up the fluids, consider cranberry tabs and after you recover, start distilled white vinegar wipe after shower daily

## 2011-10-05 LAB — URINE CULTURE

## 2011-10-07 NOTE — Assessment & Plan Note (Addendum)
Just saw her breast surgeon and was told she should still be doing self breast exams which she had not been doing. Since going home she has been doing self exams and palpated a firm lesion, mildly tender in her left axilla. She has an appt with her Plastic surgeon next week so they will reexamine and she is advised to discuss this lesion with her surgeon Dr Carolynne Edouard since he has examined her routinely since her surgeries and her spacers were placed. If she has trouble contacting his office she will let us know so we can intervene

## 2011-10-08 ENCOUNTER — Encounter: Payer: Self-pay | Admitting: Family Medicine

## 2011-10-08 DIAGNOSIS — N760 Acute vaginitis: Secondary | ICD-10-CM | POA: Insufficient documentation

## 2011-10-08 HISTORY — DX: Acute vaginitis: N76.0

## 2011-10-08 NOTE — Assessment & Plan Note (Signed)
Considering everything she has been through she is doing well. She will let us know if she begins to feel overwhelmed

## 2011-10-08 NOTE — Assessment & Plan Note (Signed)
Exam c/w vaginitis, started on Metronidazole and fluconazole, start a probiotic, cotton undergarments etc

## 2011-10-08 NOTE — Progress Notes (Signed)
Patient ID: Abigail [name], female   DOB: 1965-12-19, 46 y.o.   MRN: [id number] Abigail [name] [id number] 27-Jan-1966 10/08/2011      Progress Note-Follow Up  Subjective  Chief Complaint  Chief Complaint  Patient presents with  . Vaginitis    burning (during urination and all day long), discharge, back hurting- possible uti    HPI  Patient is a 46 year old Caucasian female who is in today complaining of some vaginal discharge. She has some low-grade dysuria but also just irritation and stinging in the mucosa. Some mild low-back pain as well. No polyuria or polydipsia. No hematuria. Vaginal discharge waxes and wanes but has been present for some time. No fevers or chills. There is some generalized fatigue but she has had a rough year. She feels overall she's handling the stress of the breast cancer relatively well. She's undergone a double mastectomy and now has expanders in place in preparation for reconstruction. She notes since seeing her surgeon recently been told to start self breast exams she has done so and is noting small firm mildly tender lesion at about 2:00 in the left breast which she never noticed before. It does not appear to be growing. It is not inflamed or red. No other complaints noted at today's visit. No fevers, chest pain, palpitations, shortness of breath, GI or GU complaints noted.  Past Medical History  Diagnosis Date  . Chicken pox as a child  . Allergy     seasonal- spring  . Urticaria   . History of streptococcal pharyngitis   . History of mononucleosis   . Anemia     mild  . Allergic state 12/04/2010  . Preventative health care 12/04/2010  . Anxiety 02/11/2011  . Insomnia 02/11/2011  . PONV (postoperative nausea and vomiting)   . 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 S/P  chemotherapy, time since less than 4 weeks 05/23/11    completed 4 doses docetaxel/cyclophosphamide  . Hot flashes   . Family history of anesthesia complication     Pt adopted  . Vaginitis 10/08/2011    Past Surgical History  Procedure Date  . Mouth surgery 1997    gum surgery  . Cesarean section 1999    X 1  . Varicose vein surgery     [place] [facility]  . Portacath placement 03/20/2011    Procedure: INSERTION PORT-A-CATH;  Surgeon: Robyne Askew, MD;  Location: [facility];  Service: General;  Laterality: Right;  placement of port -Right Subclavian Vein  . Tubal ligation     1999  . Port-a-cath removal 08/07/2011    Procedure: REMOVAL PORT-A-CATH;  Surgeon: Robyne Askew, MD;  Location: [facility] OR;  Service: General;  Laterality: Right;  . Mastectomy w/ sentinel node biopsy 08/07/2011    Procedure: MASTECTOMY WITH SENTINEL LYMPH NODE BIOPSY;  Surgeon: Robyne Askew, MD;  Location: [facility] OR;  Service: General;  Laterality: Bilateral;  bilateral mastectomy  . Tissue expander placement 08/07/2011    Procedure: TISSUE EXPANDER;  Surgeon: Etter Sjogren, MD;  Location: [facility] OR;  Service: Plastics;  Laterality: Bilateral;  BILATERAL PLACEMENT OF TISSUE EXPANDERS FOR BREAST RECONSTRUCTION    Family History  Problem Relation Age of Onset  . Adopted: Yes    History   Social History  .  Marital Status: Married    Spouse Name: N/A    Number of Children: 2  . Years of Education: N/A   Occupational History  . [employer] [place]    Social History Main Topics  . Smoking status: Never Smoker   . Smokeless tobacco: Never Used  . Alcohol Use: 0.6 oz/week    1 Glasses of wine per week     occasionally- social  . Drug Use: No  . Sexually Active: Not on file   Other Topics Concern  . Not on file   Social History Narrative  . No narrative on file    Current Outpatient Prescriptions on File Prior to Visit  Medication Sig Dispense Refill  . Calcium  Carbonate-Vitamin D (CALCIUM + D PO) Take 1 tablet by mouth 2 (two) times daily.        Marland Kitchen gabapentin (NEURONTIN) 300 MG capsule Take 300 mg by mouth 1 day or 1 dose.      . IRON PO Take 1 tablet by mouth daily.      . tamoxifen (NOLVADEX) 20 MG tablet Take 20 mg by mouth daily.        Allergies  Allergen Reactions  . Penicillins Rash    Review of Systems  Review of Systems  Constitutional: Negative for fever and malaise/fatigue.  HENT: Negative for congestion.   Eyes: Negative for discharge.  Respiratory: Negative for shortness of breath.   Cardiovascular: Negative for chest pain, palpitations and leg swelling.  Gastrointestinal: Negative for nausea, abdominal pain and diarrhea.  Genitourinary: Negative for dysuria.  Skin: Negative for rash.  Neurological: Negative for loss of consciousness and headaches.  Psychiatric/Behavioral: Negative for depression and suicidal ideas. The patient is not nervous/anxious and does not have insomnia.     Objective  BP 100/71  Pulse 89  Temp 97.4 F (36.3 C) (Temporal)  Ht 5\' 6"  (1.676 m)  Wt 134 lb 6.4 oz (60.963 kg)  BMI 21.69 kg/m2  SpO2 98%  Physical Exam  Physical Exam  Constitutional: She is oriented to person, place, and time and well-developed, well-nourished, and in no distress. No distress.  HENT:  Head: Normocephalic and atraumatic.  Eyes: Conjunctivae are normal.  Neck: Neck supple. No thyromegaly present.  Cardiovascular: Normal rate, regular rhythm and normal heart sounds.   No murmur heard. Pulmonary/Chest: Effort normal and breath sounds normal. She has no wheezes.  Abdominal: She exhibits no distension and no mass.  Genitourinary: Uterus normal, cervix normal, right adnexa normal and left adnexa normal. Vaginal discharge found.       Thick grayish discharge. S/p dbl mastectomy with expanders in place. 1 cm firm, tender nodule at 2 oclock at anterior aspect of left axillae  Musculoskeletal: She exhibits no edema.    Lymphadenopathy:    She has no cervical adenopathy.  Neurological: She is alert and oriented to person, place, and time.  Skin: Skin is warm and dry. No rash noted. She is not diaphoretic.  Psychiatric: Memory, affect and judgment normal.    Lab Results  Component Value Date   TSH 0.44 02/11/2011   Lab Results  Component Value Date   WBC 3.5* 10/03/2011   HGB 11.9* 10/03/2011   HCT 36.5 10/03/2011   MCV 91.0 10/03/2011   PLT 223.0 10/03/2011   Lab Results  Component Value Date   CREATININE 0.8 10/03/2011   BUN 24* 10/03/2011   NA 141 10/03/2011   K 3.6 10/03/2011   CL 105 10/03/2011   CO2 28  10/03/2011   Lab Results  Component Value Date   ALT 44* 05/23/2011   AST 22 05/23/2011   ALKPHOS 141* 05/23/2011   BILITOT 0.3 05/23/2011   Lab Results  Component Value Date   CHOL 137 02/11/2011   Lab Results  Component Value Date   HDL 63.70 02/11/2011   Lab Results  Component Value Date   LDLCALC 63 02/11/2011   Lab Results  Component Value Date   TRIG 51.0 02/11/2011   Lab Results  Component Value Date   CHOLHDL 2 02/11/2011     Assessment & Plan  Cancer of upper-inner quadrant of female breast Just saw her breast surgeon and was told she should still be doing self breast exams which she had not been doing. Since going home she has been doing self exams and palpated a firm lesion, mildly tender in her left axilla. She has an appt with her Plastic surgeon next week so they will reexamine and she is advised to discuss this lesion with her surgeon Dr Carolynne Edouard since he has examined her routinely since her surgeries and her spacers were placed. If she has trouble contacting his office she will let us know so we can intervene  Vaginitis Exam c/w vaginitis, started on Metronidazole and fluconazole, start a probiotic, cotton undergarments etc  Anxiety Considering everything she has been through she is doing well. She will let us know if she begins to feel overwhelmed

## 2011-10-08 NOTE — Progress Notes (Signed)
Quick Note:  Patient Informed and voiced understanding.  Pt states she is not symptomatic anymore ______

## 2011-10-29 ENCOUNTER — Ambulatory Visit (HOSPITAL_BASED_OUTPATIENT_CLINIC_OR_DEPARTMENT_OTHER): Payer: BC Managed Care – PPO | Admitting: Oncology

## 2011-10-29 ENCOUNTER — Telehealth: Payer: Self-pay | Admitting: Oncology

## 2011-10-29 ENCOUNTER — Telehealth: Payer: Self-pay | Admitting: *Deleted

## 2011-10-29 ENCOUNTER — Other Ambulatory Visit (HOSPITAL_BASED_OUTPATIENT_CLINIC_OR_DEPARTMENT_OTHER): Payer: BC Managed Care – PPO | Admitting: Lab

## 2011-10-29 ENCOUNTER — Telehealth: Payer: Self-pay | Admitting: Genetic Counselor

## 2011-10-29 VITALS — BP 116/73 | HR 71 | Temp 98.3°F | Resp 20 | Ht 66.0 in | Wt 135.4 lb

## 2011-10-29 DIAGNOSIS — C50219 Malignant neoplasm of upper-inner quadrant of unspecified female breast: Secondary | ICD-10-CM

## 2011-10-29 DIAGNOSIS — Z170 Estrogen receptor positive status [ER+]: Secondary | ICD-10-CM

## 2011-10-29 LAB — CBC WITH DIFFERENTIAL/PLATELET
BASO%: 1.1 % (ref 0.0–2.0)
Basophils Absolute: 0 10*3/uL (ref 0.0–0.1)
Eosinophils Absolute: 0.2 10*3/uL (ref 0.0–0.5)
HCT: 34.1 % — ABNORMAL LOW (ref 34.8–46.6)
HGB: 11.5 g/dL — ABNORMAL LOW (ref 11.6–15.9)
MONO#: 0.4 10*3/uL (ref 0.1–0.9)
NEUT%: 49.8 % (ref 38.4–76.8)
WBC: 3.5 10*3/uL — ABNORMAL LOW (ref 3.9–10.3)
lymph#: 1.2 10*3/uL (ref 0.9–3.3)

## 2011-10-29 NOTE — Telephone Encounter (Signed)
to see dermatologist at [facility] dermatology next available [phone]  11-04-2011 at 9:00am   Spoke with Raynelle Fanning the patient will be seen at the [place] office

## 2011-10-29 NOTE — Progress Notes (Signed)
ID: Abigail [name]   DOB: 02/06/1965  MR#: [id number]  WUJ#:[id number]  PCP: Abigail Edge, MD SU: Abigail Pretty MD GYN: Abigail Chute MD OTHER:  HISTORY OF PRESENT ILLNESS: The patient is a 46 year old [place] woman who had screening mammography at [facility] 02/01/2011, showing calcifications in the left breast. She returned for diagnostic left mammography 02/21/2011, and this showed a 1.1 cm cluster of pleomorphic microcalcifications in the upper inner aspect of the left breast. Biopsy of this area was performed the same day, and showed ([id number]) An invasive ductal carcinoma, grade 2, which was estrogen receptor positive at 89%, progesterone receptor positive at 99%, with an MIB-1 of 30% and no HER-2 amplification. The patient was then set up for bilateral breast MRIs, performed 02/26/2011. There were multiple small cysts in both breasts. In the upper inner quadrant of the left breast there was a 2.7 cm enhancing mass with ill-defined margins. This contained a biopsy clip. There was a hematoma superior to this mass, measuring 2.2 cm. There were no other suspicious areas in either breast and no abnormal appearing lymph nodes.  With this information the patient's case was presented at the multidisciplinary breast cancer conference 02/27/2011, and she was seen in the [facility] clinic the same day. A decision was made to proceed with neoadjuvant treatment. Her subsequent history is as detailed below.  INTERVAL HISTORY: Abigail [name] returns today  followup of her breast cancer. Since her last visit here she had her definitive surgery, with results as noted below. She is "getting back into life", working part time at [employer], taking care of her teenagers in her home, and doing some coaching on the side  REVIEW OF SYSTEMS: She is tolerating the tamoxifen with some mild hot flashes, much improved on gabapentin. She did well with her surgery, with some pain, initially, but the drains came out  within 9 days and she had no problems with fever, dehiscence, or erythema. A detailed review of systems today is otherwise entirely negative except for 2 things. There is a little spot in the lateral aspect of her left breast reconstruction that she wanted me to check. She also had to "spots" on her back that are of concern to her.  PAST MEDICAL HISTORY: Past Medical History  Diagnosis Date  . Chicken pox as a child  . Allergy     seasonal- spring  . Urticaria   . History of streptococcal pharyngitis   . History of mononucleosis   . Anemia     mild  . Allergic state 12/04/2010  . Preventative health care 12/04/2010  . Anxiety 02/11/2011  . Insomnia 02/11/2011  . PONV (postoperative nausea and vomiting)   . 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 S/P chemotherapy, time since less than 4 weeks 05/23/11    completed 4 doses docetaxel/cyclophosphamide  . Hot flashes   . Family history of anesthesia complication     Pt adopted  . Vaginitis 10/08/2011    PAST SURGICAL HISTORY: Past Surgical History  Procedure Date  . Mouth surgery 1997    gum surgery  . Cesarean section 1999    X 1  . Varicose vein surgery     [place] [facility]  . Portacath placement 03/20/2011    Procedure: INSERTION PORT-A-CATH;  Surgeon: Abigail Askew, MD;  Location:   SURGERY CENTER;  Service: General;  Laterality: Right;  placement of port -Right Subclavian Vein  . Tubal ligation     1999  . Port-a-cath removal 08/07/2011    Procedure: REMOVAL PORT-A-CATH;  Surgeon: Abigail Askew, MD;  Location: [facility] OR;  Service: General;  Laterality: Right;  . Mastectomy w/ sentinel node biopsy 08/07/2011    Procedure: MASTECTOMY WITH SENTINEL LYMPH NODE BIOPSY;  Surgeon: Abigail Askew, MD;  Location: [facility] OR;  Service: General;   Laterality: Bilateral;  bilateral mastectomy  . Tissue expander placement 08/07/2011    Procedure: TISSUE EXPANDER;  Surgeon: Abigail Sjogren, MD;  Location: [facility] OR;  Service: Plastics;  Laterality: Bilateral;  BILATERAL PLACEMENT OF TISSUE EXPANDERS FOR BREAST RECONSTRUCTION    FAMILY HISTORY Family History  Problem Relation Age of Onset  . Adopted: Yes    Gynecologic history: She had menarche age 38, was still having regular periods at the time of her diagnosis, but periods stopped with her chemotherapy. She is s/p BTL.She used birth control pills for approximately 10 years, with no clotting or other complications. She is GX P2. Age at first live birth was 70.   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 Her husband Abigail [name] is in accounting. Son Abigail [name] (pronounced with a hard "g") is 15 and in the ninth grade, daughter Abigail [name] is 13-1/7 grade.   ADVANCED DIRECTIVES: Not in place  HEALTH MAINTENANCE: History  Substance Use Topics  . Smoking status: Never Smoker   . Smokeless tobacco: Never Used  . Alcohol Use: 0.6 oz/week    1 Glasses of wine per week     occasionally- social     Colonoscopy:  PAP:  Bone density:  Lipid panel:  Allergies  Allergen Reactions  . Penicillins Rash    Current Outpatient Prescriptions  Medication Sig Dispense Refill  . Calcium Carbonate-Vitamin D (CALCIUM + D PO) Take 1 tablet by mouth 2 (two) times daily.        . fluconazole (DIFLUCAN) 150 MG tablet 1 tab po x week x 2 weeks  1 tablet  1  . gabapentin (NEURONTIN) 300 MG capsule Take 300 mg by mouth 1 day or 1 dose.      . IRON PO Take 1 tablet by mouth daily.      . tamoxifen (NOLVADEX) 20 MG tablet Take 20 mg by mouth daily.        OBJECTIVE: Middle-aged white woman who appears well Filed Vitals:   10/29/11 0959  BP: 116/73  Pulse: 71  Temp: 98.3 F (36.8 C)  Resp: 20     Body mass index is 21.85 kg/(m^2).    ECOG FS: 0  Sclerae  unicteric Oropharynx clear No cervical or supraclavicular adenopathy Lungs no rales or rhonchi Heart regular rate and rhythm Abd benign MSK no focal spinal tenderness, no peripheral edema Neuro: nonfocal Breasts: She is status post bilateral mastectomies with expander is in place. The right breast and right axilla are unremarkable. In the left breast there is no evidence of disease recurrence. Lab D., almost into the left axilla, there is a small spot that feels like a subjacent stitch. It has not changed since she noticed it shortly after her surgery. She will keep an eye on this. Otherwise the axilla is negative. Skin: She has some seborrheic keratoses on her back, a couple of these are slightly more pigmented and variegated. These probably should come off any  way since they are [name] close to her bra line  LAB RESULTS: Lab Results  Component Value Date   WBC 3.5* 10/29/2011   NEUTROABS 1.8 10/29/2011   HGB 11.5* 10/29/2011   HCT 34.1* 10/29/2011   MCV 92.6 10/29/2011   PLT 213 10/29/2011      Chemistry      Component Value Date/Time   NA 141 10/03/2011 1200   K 3.6 10/03/2011 1200   CL 105 10/03/2011 1200   CO2 28 10/03/2011 1200   BUN 24* 10/03/2011 1200   CREATININE 0.8 10/03/2011 1200      Component Value Date/Time   CALCIUM 9.7 10/03/2011 1200   ALKPHOS 141* 05/23/2011 1018   AST 22 05/23/2011 1018   ALT 44* 05/23/2011 1018   BILITOT 0.3 05/23/2011 1018       Lab Results  Component Value Date   LABCA2 23 02/27/2011     STUDIES: No results found.     ASSESSMENT: 46 year old [place] woman with a BRCA mutation of uncertain significance  (1) status post left breast biopsy 01/24/ 2013, for a clinical T2 N0, stage IIA invasive ductal carcinoma, grade 2, strongly estrogen and progesterone receptor positive, HER-2 not amplified, with an MIB-1 of 30%.   (2) status post 4 doses of docetaxel/cyclophosphamide, completed 05/23/2011  (3) on tamoxifen since May 2013  (4) s/p  bilateral mastectomies 08/07/2011 showing  (a) on the Right, atypical lobular hyperplasia, no malignancy  (b) on the left, a residuakl pT1c pN0 invasive ductal carcinoma, estrogen receptor 97% and progesterone receptor 85% positive, HER-2 not amplified.   PLAN: Hanna did very well with her surgery, and the plan as far as that is concerned is to continue the tamoxifen for at least 2 year or possibly up to 10 years depending on whether she wants to switch to an aromatase inhibitor after 2 years or not. She has a BRCA mutation of uncertain significance, and I would favor of bilateral salpingo-oophorectomy. She is going to be meeting with a gynecologist as soon to discuss that. Otherwise she is planning to complete her reconstruction with the definite implant placement in February. She will see Dr. Carolynne Edouard in January and she will see me again in April. We'll continue to go tachycardia and her" every 3 months until she completes her first 2 years of followup, after which we will brought in the followup interval.  MAGRINAT,GUSTAV C    10/29/2011

## 2011-10-29 NOTE — Telephone Encounter (Signed)
Faxed pt medical records to pt Dermatologist. Per Dr. Darnelle Catalan

## 2011-10-29 NOTE — Telephone Encounter (Signed)
Made patient appointment for 03-23-2012 lab only  03-30-2012 md appointment  Patient aware of all appointments

## 2011-10-29 NOTE — Telephone Encounter (Signed)
to see dermatologist at [facility] dermatology next available [phone]  10-31-2011 at 9:00am

## 2011-11-28 ENCOUNTER — Encounter: Payer: Self-pay | Admitting: Family Medicine

## 2011-11-28 ENCOUNTER — Ambulatory Visit (INDEPENDENT_AMBULATORY_CARE_PROVIDER_SITE_OTHER): Payer: BC Managed Care – PPO | Admitting: Family Medicine

## 2011-11-28 VITALS — BP 115/76 | HR 70 | Temp 97.6°F | Ht 66.0 in | Wt 135.0 lb

## 2011-11-28 DIAGNOSIS — G4700 Insomnia, unspecified: Secondary | ICD-10-CM

## 2011-11-28 DIAGNOSIS — N390 Urinary tract infection, site not specified: Secondary | ICD-10-CM

## 2011-11-28 DIAGNOSIS — N760 Acute vaginitis: Secondary | ICD-10-CM

## 2011-11-28 DIAGNOSIS — R319 Hematuria, unspecified: Secondary | ICD-10-CM

## 2011-11-28 HISTORY — DX: Urinary tract infection, site not specified: N39.0

## 2011-11-28 LAB — POCT URINALYSIS DIPSTICK
Bilirubin, UA: NEGATIVE
Glucose, UA: NEGATIVE
Ketones, UA: NEGATIVE
Nitrite, UA: NEGATIVE
Spec Grav, UA: <=1.005

## 2011-11-28 MED ORDER — PHENAZOPYRIDINE HCL 200 MG PO TABS: 200.0000 mg | ORAL_TABLET | Freq: Three times a day (TID) | ORAL | Status: DC | PRN

## 2011-11-28 MED ORDER — CIPROFLOXACIN HCL 500 MG PO TABS: 500.0000 mg | ORAL_TABLET | Freq: Two times a day (BID) | ORAL | Status: DC

## 2011-11-28 MED ORDER — ZOLPIDEM TARTRATE 10 MG PO TABS: 5.0000 mg | ORAL_TABLET | Freq: Every evening | ORAL | Status: DC | PRN

## 2011-11-28 NOTE — Progress Notes (Signed)
Patient ID: Abigail [name], [name]   DOB: January 13, 1966, 46 y.o.   MRN: [id number] MASSA PE [id number] 09-Feb-1965 11/28/2011      Progress Note-Follow Up  Subjective  Chief Complaint  Chief Complaint  Patient presents with  . Urinary Tract Infection    lower back pain, bloody urine- on and off, burning X 5 days    HPI  Patient is a Abigail [name] who is in today for hematuria. She's been struggling with some intermittent urinary frequency urgency and dysuria that she has some tolerable for about 6 days. Then last night had an episode of hematuria. Acknowledges she has excessive fatigue and some anorexia as well as some vague low back pain as well. Denies any obvious fevers and chills but does note some mild malaise and myalgias. No chest pain or palpitations, shortness of breath, GI complaints are noted today except for some mildly loose stool roughly twice daily  Past Medical History  Diagnosis Date  . Chicken pox as a child  . Allergy     seasonal- spring  . Urticaria   . History of streptococcal pharyngitis   . History of mononucleosis   . Anemia     mild  . Allergic state 12/04/2010  . Preventative health care 12/04/2010  . Anxiety 02/11/2011  . Insomnia 02/11/2011  . PONV (postoperative nausea and vomiting)   . 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 S/P chemotherapy, time since less than 4 weeks 05/23/11    completed 4 doses docetaxel/cyclophosphamide  . Hot flashes   . Family history of anesthesia complication     Pt adopted  . Vaginitis 10/08/2011  . UTI (lower urinary tract infection) 11/28/2011    Past Surgical History  Procedure Date  . Mouth surgery 1997    gum surgery  . Cesarean section 1999    X 1  . Varicose vein surgery     [place] [facility]  .  Portacath placement 03/20/2011    Procedure: INSERTION PORT-A-CATH;  Surgeon: Robyne Askew, MD;  Location: [facility];  Service: General;  Laterality: Right;  placement of port -Right Subclavian Vein  . Tubal ligation     1999  . Port-a-cath removal 08/07/2011    Procedure: REMOVAL PORT-A-CATH;  Surgeon: Robyne Askew, MD;  Location: [facility] OR;  Service: General;  Laterality: Right;  . Mastectomy w/ sentinel node biopsy 08/07/2011    Procedure: MASTECTOMY WITH SENTINEL LYMPH NODE BIOPSY;  Surgeon: Robyne Askew, MD;  Location: [facility] OR;  Service: General;  Laterality: Bilateral;  bilateral mastectomy  . Tissue expander placement 08/07/2011    Procedure: TISSUE EXPANDER;  Surgeon: Etter Sjogren, MD;  Location: [facility] OR;  Service: Plastics;  Laterality: Bilateral;  BILATERAL PLACEMENT OF TISSUE EXPANDERS FOR BREAST RECONSTRUCTION    Family History  Problem Relation Age of Onset  . Adopted: Yes    History   Social History  . Marital Status: Married    Spouse Name: N/A    Number of Children: 2  . Years of Education: N/A   Occupational History  . [employer] [place]   Social History Main Topics  . Smoking status: Never Smoker   . Smokeless tobacco: Never Used  . Alcohol Use: 0.6 oz/week    1 Glasses of wine  per week     occasionally- social  . Drug Use: No  . Sexually Active: Not on file   Other Topics Concern  . Not on file   Social History Narrative  . No narrative on file    Current Outpatient Prescriptions on File Prior to Visit  Medication Sig Dispense Refill  . Calcium Carbonate-Vitamin D (CALCIUM + D PO) Take 1 tablet by mouth 2 (two) times daily.        Marland Kitchen gabapentin (NEURONTIN) 300 MG capsule Take 300 mg by mouth 1 day or 1 dose.      . IRON PO Take 1 tablet by mouth daily.      . tamoxifen (NOLVADEX) 20 MG tablet Take 20 mg by mouth daily.        Allergies  Allergen Reactions  . Penicillins Rash    Review of Systems  Review of Systems    Constitutional: Positive for malaise/fatigue. Negative for fever.  HENT: Negative for congestion.   Eyes: Negative for discharge.  Respiratory: Negative for shortness of breath.   Cardiovascular: Negative for chest pain, palpitations and leg swelling.  Gastrointestinal: Negative for nausea, abdominal pain and diarrhea.  Genitourinary: Positive for dysuria, urgency, frequency and hematuria. Negative for flank pain.  Musculoskeletal: Positive for back pain. Negative for falls.  Skin: Negative for rash.  Neurological: Negative for loss of consciousness and headaches.  Endo/Heme/Allergies: Negative for polydipsia.  Psychiatric/Behavioral: Negative for depression and suicidal ideas. The patient is not nervous/anxious and does not have insomnia.     Objective  BP 115/76  Pulse 70  Temp 97.6 F (36.4 C) (Temporal)  Ht 5\' 6"  (1.676 m)  Wt 135 lb (61.236 kg)  BMI 21.79 kg/m2  SpO2 100%  Physical Exam  Physical Exam  Constitutional: She is oriented to person, place, and time and well-developed, well-nourished, and in no distress. No distress.  HENT:  Head: Normocephalic and atraumatic.  Eyes: Conjunctivae normal are normal.  Neck: Neck supple. No thyromegaly present.  Cardiovascular: Normal rate, regular rhythm and normal heart sounds.   No murmur heard. Pulmonary/Chest: Effort normal and breath sounds normal. She has no wheezes.  Abdominal: She exhibits no distension and no mass.  Musculoskeletal: She exhibits no edema.  Lymphadenopathy:    She has no cervical adenopathy.  Neurological: She is alert and oriented to person, place, and time.  Skin: Skin is warm and dry. No rash noted. She is not diaphoretic.  Psychiatric: Memory, affect and judgment normal.    Lab Results  Component Value Date   TSH 0.44 02/11/2011   Lab Results  Component Value Date   WBC 3.5* 10/29/2011   HGB 11.5* 10/29/2011   HCT 34.1* 10/29/2011   MCV 92.6 10/29/2011   PLT 213 10/29/2011   Lab Results   Component Value Date   CREATININE 0.8 10/03/2011   BUN 24* 10/03/2011   NA 141 10/03/2011   K 3.6 10/03/2011   CL 105 10/03/2011   CO2 28 10/03/2011   Lab Results  Component Value Date   ALT 44* 05/23/2011   AST 22 05/23/2011   ALKPHOS 141* 05/23/2011   BILITOT 0.3 05/23/2011   Lab Results  Component Value Date   CHOL 137 02/11/2011   Lab Results  Component Value Date   HDL 63.70 02/11/2011   Lab Results  Component Value Date   LDLCALC 63 02/11/2011   Lab Results  Component Value Date   TRIG 51.0 02/11/2011   Lab Results  Component Value  Date   CHOLHDL 2 02/11/2011     Assessment & Plan  Vaginitis Is seeing GYN specialist next week  UTI (lower urinary tract infection) Urine sent for culture, started on Ciprofloxacin and Pyridium. Push clear fluids, start a probiotic  Insomnia Given refill on Ambien to  Use prn.

## 2011-11-28 NOTE — Assessment & Plan Note (Signed)
Given refill on Ambien to  Use prn.

## 2011-11-28 NOTE — Assessment & Plan Note (Signed)
Is seeing GYN specialist next week

## 2011-11-28 NOTE — Assessment & Plan Note (Signed)
Urine sent for culture, started on Ciprofloxacin and Pyridium. Push clear fluids, start a probiotic

## 2011-11-28 NOTE — Patient Instructions (Addendum)
Urinary Tract Infection Urinary tract infections (UTIs) can develop anywhere along your urinary tract. Your urinary tract is your body's drainage system for removing wastes and extra water. Your urinary tract includes two kidneys, two ureters, a bladder, and a urethra. Your kidneys are a pair of bean-shaped organs. Each kidney is about the size of your fist. They are located below your ribs, one on each side of your spine. CAUSES Infections are caused by microbes, which are microscopic organisms, including fungi, viruses, and bacteria. These organisms are so small that they can only be seen through a microscope. Bacteria are the microbes that most commonly cause UTIs. SYMPTOMS  Symptoms of UTIs may vary by age and gender of the patient and by the location of the infection. Symptoms in young women typically include a frequent and intense urge to urinate and a painful, burning feeling in the bladder or urethra during urination. Older women and men are more likely to be tired, shaky, and weak and have muscle aches and abdominal pain. A fever may mean the infection is in your kidneys. Other symptoms of a kidney infection include pain in your back or sides below the ribs, nausea, and vomiting. DIAGNOSIS To diagnose a UTI, your caregiver will ask you about your symptoms. Your caregiver also will ask to provide a urine sample. The urine sample will be tested for bacteria and white blood cells. White blood cells are made by your body to help fight infection. TREATMENT  Typically, UTIs can be treated with medication. Because most UTIs are caused by a bacterial infection, they usually can be treated with the use of antibiotics. The choice of antibiotic and length of treatment depend on your symptoms and the type of bacteria causing your infection. HOME CARE INSTRUCTIONS  If you were prescribed antibiotics, take them exactly as your caregiver instructs you. Finish the medication even if you feel better after you  have only taken some of the medication.  Drink enough water and fluids to keep your urine clear or pale yellow.  Avoid caffeine, tea, and carbonated beverages. They tend to irritate your bladder.  Empty your bladder often. Avoid holding urine for long periods of time.  Empty your bladder before and after sexual intercourse.  After a bowel movement, women should cleanse from front to back. Use each tissue only once. SEEK MEDICAL CARE IF:   You have back pain.  You develop a fever.  Your symptoms do not begin to resolve within 3 days. SEEK IMMEDIATE MEDICAL CARE IF:   You have severe back pain or lower abdominal pain.  You develop chills.  You have nausea or vomiting.  You have continued burning or discomfort with urination. MAKE SURE YOU:   Understand these instructions.  Will watch your condition.  Will get help right away if you are not doing well or get worse. Document Released: 10/24/2004 Document Revised: 07/16/2011 Document Reviewed: 02/22/2011 ExitCare Patient Information 2013 [employer].  

## 2011-11-29 ENCOUNTER — Encounter: Payer: Self-pay | Admitting: Family Medicine

## 2011-12-01 LAB — URINE CULTURE: Colony Count: >=100000

## 2011-12-03 ENCOUNTER — Encounter: Payer: Self-pay | Admitting: Internal Medicine

## 2011-12-03 ENCOUNTER — Ambulatory Visit (INDEPENDENT_AMBULATORY_CARE_PROVIDER_SITE_OTHER): Payer: BC Managed Care – PPO | Admitting: Internal Medicine

## 2011-12-03 VITALS — BP 100/60 | HR 84 | Temp 97.9°F | Resp 20 | Ht 66.0 in | Wt 134.0 lb

## 2011-12-03 DIAGNOSIS — D649 Anemia, unspecified: Secondary | ICD-10-CM

## 2011-12-03 DIAGNOSIS — N898 Other specified noninflammatory disorders of vagina: Secondary | ICD-10-CM | POA: Insufficient documentation

## 2011-12-03 DIAGNOSIS — Z780 Asymptomatic menopausal state: Secondary | ICD-10-CM | POA: Insufficient documentation

## 2011-12-03 DIAGNOSIS — Z9013 Acquired absence of bilateral breasts and nipples: Secondary | ICD-10-CM | POA: Insufficient documentation

## 2011-12-03 DIAGNOSIS — Z9010 Acquired absence of unspecified breast and nipple: Secondary | ICD-10-CM

## 2011-12-03 DIAGNOSIS — N9489 Other specified conditions associated with female genital organs and menstrual cycle: Secondary | ICD-10-CM

## 2011-12-03 NOTE — Progress Notes (Signed)
Subjective:    Patient ID: Abigail [name], female    DOB: 21-Apr-1965, 46 y.o.   MRN: [id number]  HPI  New pt here for first visit.  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  She is S/P bilateral mastectomy and is on Tamoxifen treatment.   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  Pt is adopted.   She has thick vaginal discharge at times and sometimes feels dry.  Recently completed cipro for UTI.  No dysuria or urgency now.  She is sexually active with her spouse  Has vasomotor flushes but tolerable now.    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  Mother is a widow and calls pt daily multiple times.    Allergies  Allergen Reactions  . Penicillins Rash   Past Medical History  Diagnosis Date  . Chicken pox as a child  . Allergy     seasonal- spring  . Urticaria   . History of streptococcal pharyngitis   . History of mononucleosis   . Anemia     mild  . Allergic state 12/04/2010  . Preventative health care 12/04/2010  . Anxiety 02/11/2011  . Insomnia 02/11/2011  . PONV (postoperative nausea and vomiting)   . 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 S/P chemotherapy, time since less than 4 weeks 05/23/11    completed 4 doses docetaxel/cyclophosphamide  . Hot flashes   . Family history of anesthesia complication     Pt adopted  . Vaginitis 10/08/2011  . UTI (lower urinary tract infection) 11/28/2011   Past Surgical History  Procedure Date  . Mouth surgery 1997    gum surgery  . Cesarean section 1999    X 1    . Varicose vein surgery     [place] [facility]  . Portacath placement 03/20/2011    Procedure: INSERTION PORT-A-CATH;  Surgeon: Robyne Askew, MD;  Location: [facility];  Service: General;  Laterality: Right;  placement of port -Right Subclavian Vein  . Tubal ligation     1999  . Port-a-cath removal 08/07/2011    Procedure: REMOVAL PORT-A-CATH;  Surgeon: Robyne Askew, MD;  Location: [facility] OR;  Service: General;  Laterality: Right;  . Mastectomy w/ sentinel node biopsy 08/07/2011    Procedure: MASTECTOMY WITH SENTINEL LYMPH NODE BIOPSY;  Surgeon: Robyne Askew, MD;  Location: [facility] OR;  Service: General;  Laterality: Bilateral;  bilateral mastectomy  . Tissue expander placement 08/07/2011    Procedure: TISSUE EXPANDER;  Surgeon: Etter Sjogren, MD;  Location: [facility] OR;  Service: Plastics;  Laterality: Bilateral;  BILATERAL PLACEMENT OF TISSUE EXPANDERS FOR BREAST RECONSTRUCTION   History   Social History  . Marital Status: Married    Spouse Name: N/A    Number of Children: 2  . Years of Education: N/A   Occupational History  . [employer] [place]   Social History Main Topics  . Smoking status: Never Smoker   . Smokeless tobacco: Never Used  .  Alcohol Use: 0.6 oz/week    1 Glasses of wine per week     Comment: occasionally- social  . Drug Use: No  . Sexually Active: Yes    Birth Control/ Protection: Surgical   Other Topics Concern  . Not on file   Social History Narrative  . No narrative on file   Family History  Problem Relation Age of Onset  . Adopted: Yes  . Family history unknown: Yes   Patient Active Problem List  Diagnosis  . History of streptococcal pharyngitis  . History of mononucleosis  . Allergic state  . Preventative health care  . DERMATITIS, ATOPIC  . Anxiety  . Insomnia  . Cancer of upper-inner quadrant of female breast  . Vaginitis  . UTI (lower urinary tract infection)  . Vaginal dryness  . S/P mastectomy, bilateral   . Menopause   Current Outpatient Prescriptions on File Prior to Visit  Medication Sig Dispense Refill  . Calcium Carbonate-Vitamin D (CALCIUM + D PO) Take 1 tablet by mouth 2 (two) times daily.        Marland Kitchen gabapentin (NEURONTIN) 300 MG capsule Take 300 mg by mouth 1 day or 1 dose.      . IRON PO Take 1 tablet by mouth daily.      . tamoxifen (NOLVADEX) 20 MG tablet Take 20 mg by mouth daily.      Marland Kitchen zolpidem (AMBIEN) 10 MG tablet Take 0.5 tablets (5 mg total) by mouth at bedtime as needed. Takes 1/2 of 10 mg tablet as needed related to shift work.  30 tablet  5  . ciprofloxacin (CIPRO) 500 MG tablet Take 1 tablet (500 mg total) by mouth 2 (two) times daily. Take 4-7 days worth of treatment as directed  14 tablet  0  . phenazopyridine (PYRIDIUM) 200 MG tablet Take 1 tablet (200 mg total) by mouth 3 (three) times daily as needed for pain.  6 tablet  1      Review of Systems See HPI    Objective:   Physical Exam Physical Exam  Nursing note and vitals reviewed.  Constitutional: She is oriented to person, place, and time. She appears well-developed and well-nourished.  HENT:  Head: Normocephalic and atraumatic.  Cardiovascular: Normal rate and regular rhythm. Exam reveals no gallop and no friction rub.  No murmur heard.  Pulmonary/Chest: Breath sounds normal. She has no wheezes. She has no rales.  Neurological: She is alert and oriented to person, place, and time.  Skin: Skin is warm and dry.  Psychiatric: She has a normal mood and affect. Her behavior is normal.  Ext no edema            Assessment & Plan:  Recent UTI  Urine neg today  Vaginal dryness:  Will give Luvena samples nightly for two weeks then twice a week and with intercourse.  Stage II breast cancer on Tamoxifen:  She has discussed genetic test results RAD51C  with counselor at [facility].  She is in a very gray area with respect to bilateral oopherectomy.  I offered evaluation by GYN/oncology but she declines at this  time.  Ok to refer when ready  History of anemia on iron.    Vasomotor flushes secondary to Tamoxifen.  Pt states neurontin not helping much but Remus Loffler helps her sleep.  Recommended cooling towel.  OK to continue Ambien.  Situational family stress:  I gave pt number to [employer] or Vonna Kotyk for counseling

## 2011-12-03 NOTE — Patient Instructions (Addendum)
See me as needed  Luvena samples given

## 2011-12-13 ENCOUNTER — Telehealth: Payer: Self-pay | Admitting: Oncology

## 2011-12-13 NOTE — Telephone Encounter (Signed)
lmonvm adviisng the pt of her cancelled march 3rd appt that hve been r/s to 3/ 10/2012 due to a change in the md's schedule

## 2012-02-24 ENCOUNTER — Telehealth: Payer: Self-pay | Admitting: Oncology

## 2012-02-24 NOTE — Telephone Encounter (Signed)
Call pt re forwarded message received from desk nurse. Per desk nurse ok pt move appts to April. R/s 2/24 lb and 3/10 f/u to 4/10 and 4/17. S/w pt she has both new appt d/t's

## 2012-02-25 ENCOUNTER — Encounter (INDEPENDENT_AMBULATORY_CARE_PROVIDER_SITE_OTHER): Payer: BC Managed Care – PPO | Admitting: General Surgery

## 2012-03-02 ENCOUNTER — Ambulatory Visit (INDEPENDENT_AMBULATORY_CARE_PROVIDER_SITE_OTHER): Payer: BC Managed Care – PPO | Admitting: General Surgery

## 2012-03-02 VITALS — BP 126/72 | HR 74 | Temp 97.4°F | Resp 18 | Ht 66.0 in | Wt 134.0 lb

## 2012-03-02 DIAGNOSIS — C50219 Malignant neoplasm of upper-inner quadrant of unspecified female breast: Secondary | ICD-10-CM

## 2012-03-02 NOTE — Patient Instructions (Signed)
Continue tamoxifen Continue regular self exams 

## 2012-03-03 NOTE — Progress Notes (Signed)
Subjective:     Patient ID: Abigail [name], female   DOB: [birth year], 47 y.o.   MRN: [id number]  HPI The patient is a 47 her white female who is 7 months status post bilateral mastectomies and left sentinel lymph node biopsy for a T1 C. N0 left breast cancer. She is taking tamoxifen and tolerating it well. She is scheduled to have her tissue expanders exchange for implants on February 25. She otherwise feels well and has no complaints today.  Review of Systems  Constitutional: Negative.   HENT: Negative.   Eyes: Negative.   Respiratory: Negative.   Cardiovascular: Negative.   Gastrointestinal: Negative.   Genitourinary: Negative.   Musculoskeletal: Negative.   Skin: Negative.   Neurological: Negative.   Hematological: Negative.   Psychiatric/Behavioral: Negative.        Objective:   Physical Exam  Constitutional: She is oriented to person, place, and time. She appears well-developed and well-nourished.  HENT:  Head: Normocephalic and atraumatic.  Eyes: Conjunctivae normal and EOM are normal. Pupils are equal, round, and reactive to light.  Neck: Normal range of motion. Neck supple.  Cardiovascular: Normal rate, regular rhythm and normal heart sounds.   Pulmonary/Chest: Effort normal and breath sounds normal.       Both of her mastectomy incisions have healed nicely. There is no palpable mass of either reconstructed breast. There is no palpable axillary or supraclavicular cervical lymphadenopathy  Abdominal: Soft. Bowel sounds are normal. She exhibits no mass. There is no tenderness.  Musculoskeletal: Normal range of motion.  Lymphadenopathy:    She has no cervical adenopathy.  Neurological: She is alert and oriented to person, place, and time.  Skin: Skin is warm and dry.  Psychiatric: She has a normal mood and affect. Her behavior is normal.       Assessment:     The patient is 7 months status post bilateral mastectomies for a left breast cancer    Plan:     At this  point she will continue to take tamoxifen. Her tissue expanders will be exchanged on November 25. She'll continue to do regular self exams. We will plan to see her back in about 5 months

## 2012-03-10 ENCOUNTER — Encounter (HOSPITAL_COMMUNITY): Payer: Self-pay

## 2012-03-14 ENCOUNTER — Other Ambulatory Visit: Payer: Self-pay

## 2012-03-16 ENCOUNTER — Encounter (HOSPITAL_COMMUNITY): Admission: RE | Admit: 2012-03-16 | Payer: BC Managed Care – PPO | Source: Ambulatory Visit

## 2012-03-20 ENCOUNTER — Encounter (HOSPITAL_COMMUNITY): Payer: Self-pay

## 2012-03-20 ENCOUNTER — Encounter (HOSPITAL_COMMUNITY)
Admission: RE | Admit: 2012-03-20 | Discharge: 2012-03-20 | Disposition: A | Discharge status: Home / Self Care | Payer: BC Managed Care – PPO | Source: Ambulatory Visit | Attending: Plastic Surgery | Admitting: Plastic Surgery

## 2012-03-20 LAB — CBC
HCT: 37.3 % (ref 36.0–46.0)
MCHC: 33.8 g/dL (ref 30.0–36.0)
Platelets: 222 10*3/uL (ref 150–400)
RDW: 12.1 % (ref 11.5–15.5)

## 2012-03-20 LAB — SURGICAL PCR SCREEN: Staphylococcus aureus: NEGATIVE

## 2012-03-20 NOTE — Pre-Procedure Instructions (Signed)
Abigail [name]  03/20/2012   Your procedure is scheduled on:  Tuesday, February 25th  Report to [facility] at 0530 AM.  Call this number if you have problems the morning of surgery: [phone]   Remember:   Do not eat food or drink liquids after midnight.    Take these medicines the morning of surgery with A SIP OF WATER: tamoxifen   Do not wear jewelry, make-up or nail polish.  Do not wear lotions, powders, or perfumes,deodorant.  Do not shave 48 hours prior to surgery.   Do not bring valuables to the hospital.  Contacts, dentures or bridgework may not be worn into surgery.  Leave suitcase in the car. After surgery it may be brought to your room.  For patients admitted to the hospital, checkout time is 11:00 AM the day of discharge.   Patients discharged the day of surgery will not be allowed to drive home.    Special Instructions: Shower using CHG 2 nights before surgery and the night before surgery.  If you shower the day of surgery use CHG.  Use special wash - you have one bottle of CHG for all showers.  You should use approximately 1/3 of the bottle for each shower.   Please read over the following fact sheets that you were given: Pain Booklet, Coughing and Deep Breathing, MRSA Information and Surgical Site Infection Prevention

## 2012-03-23 ENCOUNTER — Other Ambulatory Visit: Payer: BC Managed Care – PPO | Admitting: Lab

## 2012-03-23 ENCOUNTER — Encounter (HOSPITAL_COMMUNITY): Payer: Self-pay | Admitting: Anesthesiology

## 2012-03-23 MED ORDER — CLINDAMYCIN PHOSPHATE 600 MG/50ML IV SOLN
600.0000 mg | Freq: Once | INTRAVENOUS | Status: DC
  Filled 2012-03-23: qty 50

## 2012-03-23 NOTE — Anesthesia Preprocedure Evaluation (Addendum)
Anesthesia Evaluation  Patient identified by MRN, date of birth, ID band Patient awake    Reviewed: Allergy & Precautions, H&P , NPO status , Patient's Chart, lab work & pertinent test results  History of Anesthesia Complications Negative for: history of anesthetic complications  Airway Mallampati: I TM Distance: >3 FB Neck ROM: Full    Dental  (+) Teeth Intact and Dental Advisory Given   Pulmonary neg pulmonary ROS,  PORTABLE CHEST - 1 VIEW  03-20-11   Comparison: None.   Findings: Power port has been placed on the right, entering the internal jugular vein with its tip just above the SVC/RA junction. Heart size is normal.  No pneumothorax.  Lungs are clear.   IMPRESSION: Power port well positioned with its tip at the SVC/RA junction.  No apparent complication.      Pulmonary exam normal       Cardiovascular Exercise Tolerance: Good Rhythm:Regular Rate:Normal     Neuro/Psych Anxiety negative neurological ROS     GI/Hepatic negative GI ROS, Neg liver ROS,   Endo/Other  negative endocrine ROS  Renal/GU negative Renal ROS  negative genitourinary   Musculoskeletal negative musculoskeletal ROS (+)   Abdominal   Peds  Hematology negative hematology ROS (+)   Anesthesia Other Findings Breast cancer: chemo, surgery  Reproductive/Obstetrics S/P Bilateral Mastectomies w/ tissue expanders in place.                      Anesthesia Physical Anesthesia Plan  ASA: II  Anesthesia Plan: General   Post-op Pain Management:    Induction: Intravenous  Airway Management Planned: Oral ETT  Additional Equipment:   Intra-op Plan:   Post-operative Plan: Extubation in OR  Informed Consent: I have reviewed the patients History and Physical, chart, labs and discussed the procedure including the risks, benefits and alternatives for the proposed anesthesia with the patient or authorized representative  who has indicated his/her understanding and acceptance.   Dental advisory given  Plan Discussed with: CRNA and Surgeon  Anesthesia Plan Comments: (Plan routine monitors, GETA)       Anesthesia Quick Evaluation

## 2012-03-23 NOTE — Preoperative (Addendum)
Beta Blockers   Reason not to administer Beta Blockers:Not Applicable 

## 2012-03-24 ENCOUNTER — Ambulatory Visit (HOSPITAL_COMMUNITY): Payer: BC Managed Care – PPO | Admitting: Anesthesiology

## 2012-03-24 ENCOUNTER — Encounter (HOSPITAL_COMMUNITY): Payer: Self-pay | Admitting: Anesthesiology

## 2012-03-24 ENCOUNTER — Ambulatory Visit (HOSPITAL_COMMUNITY)
Admission: RE | Admit: 2012-03-24 | Discharge: 2012-03-24 | Disposition: A | Discharge status: Home / Self Care | Payer: BC Managed Care – PPO | Source: Ambulatory Visit | Attending: Plastic Surgery | Admitting: Plastic Surgery

## 2012-03-24 ENCOUNTER — Encounter (HOSPITAL_COMMUNITY)
Admission: RE | Disposition: A | Discharge status: Home / Self Care | Payer: Self-pay | Source: Ambulatory Visit | Attending: Plastic Surgery

## 2012-03-24 DIAGNOSIS — Z421 Encounter for breast reconstruction following mastectomy: Secondary | ICD-10-CM | POA: Insufficient documentation

## 2012-03-24 DIAGNOSIS — Z853 Personal history of malignant neoplasm of breast: Secondary | ICD-10-CM | POA: Insufficient documentation

## 2012-03-24 HISTORY — PX: BREAST RECONSTRUCTION: SHX9

## 2012-03-24 SURGERY — REMOVAL, TISSUE EXPANDER
Anesthesia: General | Site: Breast | Laterality: Bilateral | Wound class: Clean

## 2012-03-24 MED ORDER — DEXAMETHASONE SODIUM PHOSPHATE 4 MG/ML IJ SOLN
INTRAMUSCULAR | Status: DC | PRN
  Administered 2012-03-24: 8 mg via INTRAVENOUS

## 2012-03-24 MED ORDER — OXYCODONE HCL 5 MG PO TABS
ORAL_TABLET | ORAL | Status: AC
  Filled 2012-03-24: qty 1

## 2012-03-24 MED ORDER — SODIUM CHLORIDE 0.9 % IR SOLN
Status: DC | PRN
  Administered 2012-03-24: 07:00:00

## 2012-03-24 MED ORDER — DEXTROSE 5 % IV SOLN
INTRAVENOUS | Status: DC | PRN
  Administered 2012-03-24 (×2): via INTRAVENOUS

## 2012-03-24 MED ORDER — SODIUM CHLORIDE 0.9 % IR SOLN
Status: DC | PRN
  Administered 2012-03-24 (×2): 1

## 2012-03-24 MED ORDER — BACITRACIN ZINC 500 UNIT/GM EX OINT
TOPICAL_OINTMENT | CUTANEOUS | Status: AC
  Filled 2012-03-24: qty 15

## 2012-03-24 MED ORDER — CLINDAMYCIN PHOSPHATE 900 MG/50ML IV SOLN
900.0000 mg | Freq: Once | INTRAVENOUS | Status: AC
  Administered 2012-03-24: 900 mg via INTRAVENOUS
  Filled 2012-03-24: qty 50

## 2012-03-24 MED ORDER — LACTATED RINGERS IV SOLN
INTRAVENOUS | Status: DC | PRN
  Administered 2012-03-24 (×2): via INTRAVENOUS

## 2012-03-24 MED ORDER — ACETAMINOPHEN 10 MG/ML IV SOLN
INTRAVENOUS | Status: AC
  Administered 2012-03-24: 1000 mg via INTRAVENOUS
  Filled 2012-03-24: qty 100

## 2012-03-24 MED ORDER — MIDAZOLAM HCL 5 MG/5ML IJ SOLN
INTRAMUSCULAR | Status: DC | PRN
  Administered 2012-03-24: 2 mg via INTRAVENOUS

## 2012-03-24 MED ORDER — NEOSTIGMINE METHYLSULFATE 1 MG/ML IJ SOLN
INTRAMUSCULAR | Status: DC | PRN
  Administered 2012-03-24: 3 mg via INTRAVENOUS

## 2012-03-24 MED ORDER — HYDROMORPHONE HCL PF 1 MG/ML IJ SOLN: 0.2500 mg | INTRAMUSCULAR | Status: DC | PRN

## 2012-03-24 MED ORDER — MEPERIDINE HCL 25 MG/ML IJ SOLN: 6.2500 mg | INTRAMUSCULAR | Status: DC | PRN

## 2012-03-24 MED ORDER — OXYCODONE HCL 5 MG/5ML PO SOLN: 5.0000 mg | Freq: Once | ORAL | Status: AC | PRN

## 2012-03-24 MED ORDER — FENTANYL CITRATE 0.05 MG/ML IJ SOLN
INTRAMUSCULAR | Status: DC | PRN
  Administered 2012-03-24: 150 ug via INTRAVENOUS
  Administered 2012-03-24: 50 ug via INTRAVENOUS

## 2012-03-24 MED ORDER — HEPARIN SODIUM (PORCINE) 5000 UNIT/ML IJ SOLN
INTRAMUSCULAR | Status: AC
  Administered 2012-03-24: 5000 [IU] via SUBCUTANEOUS
  Filled 2012-03-24: qty 1

## 2012-03-24 MED ORDER — ROCURONIUM BROMIDE 100 MG/10ML IV SOLN
INTRAVENOUS | Status: DC | PRN
  Administered 2012-03-24: 40 mg via INTRAVENOUS

## 2012-03-24 MED ORDER — PROMETHAZINE HCL 25 MG/ML IJ SOLN: 6.2500 mg | INTRAMUSCULAR | Status: DC | PRN

## 2012-03-24 MED ORDER — LIDOCAINE HCL 4 % MT SOLN
OROMUCOSAL | Status: DC | PRN
  Administered 2012-03-24: 4 mL via TOPICAL

## 2012-03-24 MED ORDER — ARTIFICIAL TEARS OP OINT
TOPICAL_OINTMENT | OPHTHALMIC | Status: DC | PRN
  Administered 2012-03-24: 1 via OPHTHALMIC

## 2012-03-24 MED ORDER — MIDAZOLAM HCL 2 MG/2ML IJ SOLN: 0.5000 mg | Freq: Once | INTRAMUSCULAR | Status: DC | PRN

## 2012-03-24 MED ORDER — OXYCODONE HCL 5 MG PO TABS
5.0000 mg | ORAL_TABLET | Freq: Once | ORAL | Status: AC | PRN
  Administered 2012-03-24: 5 mg via ORAL

## 2012-03-24 MED ORDER — LIDOCAINE HCL (CARDIAC) 20 MG/ML IV SOLN
INTRAVENOUS | Status: DC | PRN
  Administered 2012-03-24: 20 mg via INTRAVENOUS

## 2012-03-24 MED ORDER — PHENYLEPHRINE HCL 10 MG/ML IJ SOLN
INTRAMUSCULAR | Status: DC | PRN
  Administered 2012-03-24 (×6): 80 ug via INTRAVENOUS

## 2012-03-24 MED ORDER — PROPOFOL 10 MG/ML IV BOLUS
INTRAVENOUS | Status: DC | PRN
  Administered 2012-03-24: 150 mg via INTRAVENOUS
  Administered 2012-03-24: 50 mg via INTRAVENOUS

## 2012-03-24 MED ORDER — GLYCOPYRROLATE 0.2 MG/ML IJ SOLN
INTRAMUSCULAR | Status: DC | PRN
  Administered 2012-03-24: 0.2 mg via INTRAVENOUS
  Administered 2012-03-24: 0.4 mg via INTRAVENOUS

## 2012-03-24 MED ORDER — CLINDAMYCIN PHOSPHATE 900 MG/50ML IV SOLN: 900.0000 mg | Freq: Once | INTRAVENOUS | Status: DC

## 2012-03-24 MED ORDER — CHLORHEXIDINE GLUCONATE 4 % EX LIQD: 1.0000 "application " | Freq: Once | CUTANEOUS | Status: DC

## 2012-03-24 MED ORDER — ONDANSETRON HCL 4 MG/2ML IJ SOLN
INTRAMUSCULAR | Status: DC | PRN
  Administered 2012-03-24: 4 mg via INTRAVENOUS

## 2012-03-24 MED ORDER — HEPARIN SODIUM (PORCINE) 5000 UNIT/ML IJ SOLN: 5000.0000 [IU] | Freq: Once | INTRAMUSCULAR | Status: AC

## 2012-03-24 SURGICAL SUPPLY — 67 items
ADH SKN CLS APL DERMABOND .7 (GAUZE/BANDAGES/DRESSINGS)
APPLIER CLIP 9.375 MED OPEN (MISCELLANEOUS)
APR CLP MED 9.3 20 MLT OPN (MISCELLANEOUS)
ATCH SMKEVC FLXB CAUT HNDSWH (FILTER) ×1 IMPLANT
BAG DECANTER FOR FLEXI CONT (MISCELLANEOUS) ×2 IMPLANT
BANDAGE ELASTIC 6 VELCRO ST LF (GAUZE/BANDAGES/DRESSINGS) IMPLANT
BINDER BREAST LRG (GAUZE/BANDAGES/DRESSINGS) ×1 IMPLANT
BIOPATCH RED 1 DISK 7.0 (GAUZE/BANDAGES/DRESSINGS) ×2 IMPLANT
BLADE SURG 15 STRL LF DISP TIS (BLADE) IMPLANT
BLADE SURG 15 STRL SS (BLADE)
CANISTER SUCTION 2500CC (MISCELLANEOUS) ×2 IMPLANT
CHLORAPREP W/TINT 26ML (MISCELLANEOUS) ×2 IMPLANT
CLIP APPLIE 9.375 MED OPEN (MISCELLANEOUS) IMPLANT
CLOTH BEACON ORANGE TIMEOUT ST (SAFETY) ×2 IMPLANT
COVER SURGICAL LIGHT HANDLE (MISCELLANEOUS) ×2 IMPLANT
DERMABOND ADVANCED (GAUZE/BANDAGES/DRESSINGS)
DERMABOND ADVANCED .7 DNX12 (GAUZE/BANDAGES/DRESSINGS) ×1 IMPLANT
DRAIN CHANNEL 19F RND (DRAIN) ×2 IMPLANT
DRAPE LAPAROSCOPIC ABDOMINAL (DRAPES) ×2 IMPLANT
DRAPE ORTHO SPLIT 77X108 STRL (DRAPES) ×4
DRAPE PROXIMA HALF (DRAPES) ×5 IMPLANT
DRAPE SURG 17X23 STRL (DRAPES) ×8 IMPLANT
DRAPE SURG ORHT 6 SPLT 77X108 (DRAPES) ×2 IMPLANT
DRAPE WARM FLUID 44X44 (DRAPE) ×2 IMPLANT
DRESSING TELFA 8X3 (GAUZE/BANDAGES/DRESSINGS) ×2 IMPLANT
DRSG PAD ABDOMINAL 8X10 ST (GAUZE/BANDAGES/DRESSINGS) ×6 IMPLANT
ELECT BLADE 6.5 EXT (BLADE) ×2 IMPLANT
ELECT CAUTERY BLADE 6.4 (BLADE) ×2 IMPLANT
ELECT REM PT RETURN 9FT ADLT (ELECTROSURGICAL) ×2
ELECTRODE REM PT RTRN 9FT ADLT (ELECTROSURGICAL) ×1 IMPLANT
EVACUATOR SILICONE 100CC (DRAIN) ×2 IMPLANT
EVACUATOR SMOKE ACCUVAC VALLEY (FILTER) ×1
GAUZE XEROFORM 5X9 LF (GAUZE/BANDAGES/DRESSINGS) IMPLANT
GLOVE BIO SURGEON STRL SZ7.5 (GLOVE) ×2 IMPLANT
GLOVE BIOGEL PI IND STRL 6.5 (GLOVE) IMPLANT
GLOVE BIOGEL PI IND STRL 8 (GLOVE) ×1 IMPLANT
GLOVE BIOGEL PI INDICATOR 6.5 (GLOVE) ×1
GLOVE BIOGEL PI INDICATOR 8 (GLOVE) ×1
GLOVE ECLIPSE 6.5 STRL STRAW (GLOVE) ×1 IMPLANT
GLOVE SURG SS PI 6.5 STRL IVOR (GLOVE) ×1 IMPLANT
GOWN PREVENTION PLUS XLARGE (GOWN DISPOSABLE) ×2 IMPLANT
GOWN STRL NON-REIN LRG LVL3 (GOWN DISPOSABLE) ×3 IMPLANT
IMPL GEL HI PROFILE 350CC (Breast) IMPLANT
IMPLANT GEL HI PROFILE 350CC (Breast) ×4 IMPLANT
KIT BASIN OR (CUSTOM PROCEDURE TRAY) ×2 IMPLANT
KIT ROOM TURNOVER OR (KITS) ×2 IMPLANT
MARKER SKIN DUAL TIP RULER LAB (MISCELLANEOUS) ×2 IMPLANT
NS IRRIG 1000ML POUR BTL (IV SOLUTION) ×4 IMPLANT
PACK GENERAL/GYN (CUSTOM PROCEDURE TRAY) ×2 IMPLANT
PAD ARMBOARD 7.5X6 YLW CONV (MISCELLANEOUS) ×4 IMPLANT
PREFILTER EVAC NS 1 1/3-3/8IN (MISCELLANEOUS) ×2 IMPLANT
SPONGE GAUZE 4X4 12PLY (GAUZE/BANDAGES/DRESSINGS) ×1 IMPLANT
STAPLER VISISTAT 35W (STAPLE) ×1 IMPLANT
STRIP CLOSURE SKIN 1/2X4 (GAUZE/BANDAGES/DRESSINGS) ×1 IMPLANT
SUT MNCRL AB 3-0 PS2 18 (SUTURE) ×6 IMPLANT
SUT PDS AB 3-0 SH 27 (SUTURE) IMPLANT
SUT PROLENE 3 0 PS 2 (SUTURE) ×4 IMPLANT
SUT VIC AB 3-0 SH 18 (SUTURE) ×3 IMPLANT
SUT VIC AB 3-0 SH 27 (SUTURE) ×2
SUT VIC AB 3-0 SH 27XBRD (SUTURE) IMPLANT
SYR BULB IRRIGATION 50ML (SYRINGE) ×3 IMPLANT
TOWEL OR 17X24 6PK STRL BLUE (TOWEL DISPOSABLE) ×2 IMPLANT
TOWEL OR 17X26 10 PK STRL BLUE (TOWEL DISPOSABLE) ×2 IMPLANT
TOWEL OR NON WOVEN STRL DISP B (DISPOSABLE) ×1 IMPLANT
TRAY FOLEY CATH 14FRSI W/METER (CATHETERS) IMPLANT
TUBE CONNECTING 12X1/4 (SUCTIONS) ×2 IMPLANT
WATER STERILE IRR 1000ML POUR (IV SOLUTION) IMPLANT

## 2012-03-24 NOTE — Transfer of Care (Signed)
Immediate Anesthesia Transfer of Care Note  Patient: Abigail [name]  Procedure(s) Performed: Procedure(s) with comments: REMOVAL OF BILATERAL TISSUE EXPANDERS  (Bilateral) - REMOVAL OF BILATERAL TISSUE EXPANDERS WITH PLACEMENT OF SILICONE GEL IMPLANTS BREAST RECONSTRUCTION WITH PLACEMENT OF SILICONE GEL IMPLANTS (Bilateral)  Patient Location: PACU  Anesthesia Type:General  Level of Consciousness: awake, alert  and oriented  Airway & Oxygen Therapy: Patient Spontanous Breathing and Patient connected to nasal cannula oxygen  Post-op Assessment: Report given to PACU RN and Post -op Vital signs reviewed and stable  Post vital signs: Reviewed and stable  Complications: No apparent anesthesia complications

## 2012-03-24 NOTE — Op Note (Signed)
NAMESHARLIE, SHREFFLER NO.:  [id number]  MEDICAL RECORD NO.:  [id number]  LOCATION:  [facility]                         FACILITY:  [facility]  PHYSICIAN:  Etter Sjogren, M.D.     DATE OF BIRTH:  Dec 13, 1965  DATE OF PROCEDURE:  03/24/2012 DATE OF DISCHARGE:                              OPERATIVE REPORT   PREOPERATIVE DIAGNOSIS:  Breast cancer.  POSTOPERATIVE DIAGNOSIS:  Breast cancer.  PROCEDURE:  Bilateral removal of tissue expanders and bilateral delayed reconstruction with placement of silicone gel implant.  SURGEON:  Etter Sjogren, M.D.  ANESTHESIA:  General.  ESTIMATED BLOOD LOSS:  10 mL.  CLINICAL NOTE:  A 47 year old woman who has had bilateral mastectomy and bilateral breast reconstruction tissue expanders.  She presents at this time for a planned staged procedure to remove these tissue expanders and place the implants.  She selected silicone gel as opposed to saline and she preferred 350 mL volume.  Ashby Dawes of the procedure, risks plus complications discussed with her in detail.  These risks include, but not limited to, bleeding, infection, healing problems, scarring, loss of sensation, fluid accumulation, anesthesia complications, failure of device, capsular contracture, displacement of device, wrinkles, ripples, pneumothorax, pulmonary embolism, chronic pain, disappointment, asymmetry, and secondary procedures.  She understood all this and wished to proceed.  DESCRIPTION OF PROCEDURE:  The patient was marked in the holding area and taken to the operating room and placed supine.  After successful induction of general anesthesia, she was prepped with ChloraPrep after waiting full 3 minutes for drying, she was draped with sterile drapes including impervious Steri drapes.  The lateral aspect of the old mastectomy scar was utilized, dissection was carried to the subcutaneous tissues and the underlying pectoralis muscles identified.  Muscle- splitting incision in  the direction of the muscle fiber was then used to access tissue expanders, which were deflated and gently removed.  The spaces were inspected and noted to be in excellent condition.  No evidence of any infection.  Thorough irrigation with saline as well as antibiotic solution.  After thoroughly cleaning gloves, the implants were prepared, these were Mentor smooth round high-profile 350 mL silicone gel implants.  These were soaked in antibiotic solution.  The antibiotic solution was also placed in the space and allowed to dwell there.  The implants were then positioned.  Care was taken to make sure that they were properly oriented.  The closure with 3-0 Vicryl interrupted figure-of-eight sutures with great care taken to avoid damage to the underlying implants, which were kept under direct vision at all times.  Wounds were irrigated with saline and then the remainder of the closure with 3-0 Monocryl interrupted inverted deep dermal sutures and 3-0 Prolene simple interrupted sutures.  Telfa, sterile dressings, and the breast binder were then positioned, and she was transferred to the recovery room stable having tolerated procedure well.  DISPOSITION:  She will follow up in the office in the next week.     Etter Sjogren, M.D.     DB/MEDQ  D:  03/24/2012  T:  03/24/2012  Job:  [id number]

## 2012-03-24 NOTE — Brief Op Note (Signed)
03/24/2012  9:11 AM  PATIENT:  Abigail [name]  47 y.o. female  PRE-OPERATIVE DIAGNOSIS:  BREAST CANCER  POST-OPERATIVE DIAGNOSIS:  BREAST CANCER  PROCEDURE:  Procedure(s) with comments: REMOVAL OF BILATERAL TISSUE EXPANDERS  (Bilateral) - REMOVAL OF BILATERAL TISSUE EXPANDERS WITH PLACEMENT OF SILICONE GEL IMPLANTS BREAST RECONSTRUCTION WITH PLACEMENT OF SILICONE GEL IMPLANTS (Bilateral)  SURGEON:  Surgeon(s) and Role:    * Etter Sjogren, MD - Primary  PHYSICIAN ASSISTANT:   ASSISTANTS: none   ANESTHESIA:   general  EBL:  Total I/O In: 1450 [I.V.:1450] Out: 20 [Blood:20]  BLOOD ADMINISTERED:none  DRAINS: none   LOCAL MEDICATIONS USED:  NONE  SPECIMEN:  No Specimen  DISPOSITION OF SPECIMEN:  N/A  COUNTS:  YES  TOURNIQUET:  * No tourniquets in log *  DICTATION: .Other Dictation: Dictation Number [id number]  PLAN OF CARE: Discharge to home after PACU  PATIENT DISPOSITION:  PACU - hemodynamically stable.   Delay start of Pharmacological VTE agent (>24hrs) due to surgical blood loss or risk of bleeding: not applicable

## 2012-03-24 NOTE — H&P (Signed)
I have re-evaluated and re-examined the patient and there are no changes. 47 year-old woman with breast cancer who has had bilateral mastectomy and reconstruction with tissue expanders. She presents for removal of expanders and placement of implants. She has selected silicone gel 350 cc.  PMH: negative cardiac, renal, GI, pulmonary, diabetes, hypertension. ROS: negative. Non-smoker.  Physical exam: HEENT: PERRL> EOMI> neck supple without adenopathy or mass. Lymphatics: No cervical, supraclavicular, infraclavicular, or axillary adenopathy bilateral. Chest: Well healed mastectomy scars. Tissue expanders in position. Lungs: clear all fields. Heart: RRR without murmur. S1 and S2 WNL Abdomen: Benign Extremities: FROM and equal strength bilateral.

## 2012-03-24 NOTE — Anesthesia Postprocedure Evaluation (Signed)
  Anesthesia Post-op Note  Patient: Abigail [name]  Procedure(s) Performed: Procedure(s) with comments: REMOVAL OF BILATERAL TISSUE EXPANDERS  (Bilateral) - REMOVAL OF BILATERAL TISSUE EXPANDERS WITH PLACEMENT OF SILICONE GEL IMPLANTS BREAST RECONSTRUCTION WITH PLACEMENT OF SILICONE GEL IMPLANTS (Bilateral)  Patient Location: PACU  Anesthesia Type:General  Level of Consciousness: awake, alert , oriented and patient cooperative  Airway and Oxygen Therapy: Patient Spontanous Breathing  Post-op Pain: none  Post-op Assessment: Post-op Vital signs reviewed, Patient's Cardiovascular Status Stable, Respiratory Function Stable, Patent Airway, No signs of Nausea or vomiting and Pain level controlled  Post-op Vital Signs: Reviewed and stable  Complications: No apparent anesthesia complications

## 2012-03-24 NOTE — Anesthesia Procedure Notes (Signed)
Procedure Name: Intubation Date/Time: 03/24/2012 7:48 AM Performed by: Tyrone Nine Pre-anesthesia Checklist: Patient identified, Timeout performed, Emergency Drugs available, Suction available and Patient being monitored Patient Re-evaluated:Patient Re-evaluated prior to inductionOxygen Delivery Method: Circle system utilized Preoxygenation: Pre-oxygenation with 100% oxygen Intubation Type: IV induction Ventilation: Mask ventilation without difficulty Laryngoscope Size: Mac and 3 Grade View: Grade I Tube type: Oral Tube size: 7.5 mm Number of attempts: 1 Airway Equipment and Method: Stylet Placement Confirmation: ETT inserted through vocal cords under direct vision,  breath sounds checked- equal and bilateral and positive ETCO2 Secured at: 21 cm Tube secured with: Tape Dental Injury: Teeth and Oropharynx as per pre-operative assessment

## 2012-03-26 ENCOUNTER — Encounter (HOSPITAL_COMMUNITY): Payer: Self-pay | Admitting: Plastic Surgery

## 2012-03-30 ENCOUNTER — Ambulatory Visit: Payer: BC Managed Care – PPO | Admitting: Oncology

## 2012-04-06 ENCOUNTER — Ambulatory Visit: Payer: BC Managed Care – PPO | Admitting: Oncology

## 2012-05-06 ENCOUNTER — Other Ambulatory Visit: Payer: Self-pay | Admitting: Physician Assistant

## 2012-05-06 DIAGNOSIS — C50219 Malignant neoplasm of upper-inner quadrant of unspecified female breast: Secondary | ICD-10-CM

## 2012-05-07 ENCOUNTER — Other Ambulatory Visit (HOSPITAL_BASED_OUTPATIENT_CLINIC_OR_DEPARTMENT_OTHER): Payer: BC Managed Care – PPO | Admitting: Lab

## 2012-05-07 DIAGNOSIS — C50219 Malignant neoplasm of upper-inner quadrant of unspecified female breast: Secondary | ICD-10-CM

## 2012-05-07 LAB — CBC WITH DIFFERENTIAL/PLATELET
Eosinophils Absolute: 0.1 10*3/uL (ref 0.0–0.5)
MONO#: 0.4 10*3/uL (ref 0.1–0.9)
MONO%: 11.3 % (ref 0.0–14.0)
NEUT#: 1.5 10*3/uL (ref 1.5–6.5)
RBC: 3.93 10*6/uL (ref 3.70–5.45)
RDW: 11.9 % (ref 11.2–14.5)
WBC: 3.4 10*3/uL — ABNORMAL LOW (ref 3.9–10.3)
lymph#: 1.4 10*3/uL (ref 0.9–3.3)

## 2012-05-07 LAB — COMPREHENSIVE METABOLIC PANEL (CC13)
Albumin: 3.6 g/dL (ref 3.5–5.0)
Alkaline Phosphatase: 52 U/L (ref 40–150)
CO2: 30 mEq/L — ABNORMAL HIGH (ref 22–29)
Glucose: 80 mg/dl (ref 70–99)
Potassium: 4 mEq/L (ref 3.5–5.1)
Sodium: 142 mEq/L (ref 136–145)
Total Protein: 6.7 g/dL (ref 6.4–8.3)

## 2012-05-14 ENCOUNTER — Ambulatory Visit (HOSPITAL_BASED_OUTPATIENT_CLINIC_OR_DEPARTMENT_OTHER): Payer: BC Managed Care – PPO | Admitting: Oncology

## 2012-05-14 ENCOUNTER — Telehealth: Payer: Self-pay | Admitting: Oncology

## 2012-05-14 VITALS — BP 101/76 | HR 71 | Temp 97.8°F | Resp 20 | Ht 66.0 in | Wt 134.4 lb

## 2012-05-14 DIAGNOSIS — C50219 Malignant neoplasm of upper-inner quadrant of unspecified female breast: Secondary | ICD-10-CM

## 2012-05-14 DIAGNOSIS — C50212 Malignant neoplasm of upper-inner quadrant of left female breast: Secondary | ICD-10-CM

## 2012-05-14 MED ORDER — ESTRADIOL 25 MCG VA TABS: 25.0000 ug | ORAL_TABLET | Freq: Every day | VAGINAL | Status: DC

## 2012-05-14 NOTE — Progress Notes (Signed)
ID: Abigail [name]   DOB: 1965/07/21  MR#: [id number]  WUJ#:[id number]  PCP: Levon Hedger, MD SU: Chevis Pretty MD GYN:  OTHER: Lonie Peak  HISTORY OF PRESENT ILLNESS: The patient is a 47 year old [place] woman who had screening mammography at [facility] 02/01/2011, showing calcifications in the left breast. She returned for diagnostic left mammography 02/21/2011, and this showed a 1.1 cm cluster of pleomorphic microcalcifications in the upper inner aspect of the left breast. Biopsy of this area was performed the same day, and showed ([id number]) An invasive ductal carcinoma, grade 2, which was estrogen receptor positive at 89%, progesterone receptor positive at 99%, with an MIB-1 of 30% and no HER-2 amplification. The patient was then set up for bilateral breast MRIs, performed 02/26/2011. There were multiple small cysts in both breasts. In the upper inner quadrant of the left breast there was a 2.7 cm enhancing mass with ill-defined margins. This contained a biopsy clip. There was a hematoma superior to this mass, measuring 2.2 cm. There were no other suspicious areas in either breast and no abnormal appearing lymph nodes.  With this information the patient's case was presented at the multidisciplinary breast cancer conference 02/27/2011, and she was seen in the [facility] clinic the same day. A decision was made to proceed with neoadjuvant treatment. Her subsequent history is as detailed below.  INTERVAL HISTORY: Abigail [name] returns today  followup of her breast cancer. Since her last visit here she completed her reconstruction. She just asked to have the nipple tattoos placed. She is starting to walk for exercise, and is gradually adding other activities as tolerated.  REVIEW OF SYSTEMS: She feels "come back". She is enjoying her work (she only took off 4 weeks total), family is doing fine, and she is tolerating the tamoxifen without any significant problems. Hot flashes have quieted down. She  doesn't have any problems with pain, bleeding, rash, fever, swelling, or unusual headaches, dizziness, visual changes, nausea or vomiting, cough, phlegm production, pleurisy, or change in bowel or bladder habits. Overall a detailed review of systems was benign.  PAST MEDICAL HISTORY: Past Medical History  Diagnosis Date  . Chicken pox as a child  . Allergy     seasonal- spring  . Urticaria   . History of streptococcal pharyngitis   . History of mononucleosis   . Anemia     mild  . Allergic state 12/04/2010  . Preventative health care 12/04/2010  . Anxiety 02/11/2011  . Insomnia 02/11/2011  . 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 S/P chemotherapy, time since less than 4 weeks 05/23/11    completed 4 doses docetaxel/cyclophosphamide  . Hot flashes   . Vaginitis 10/08/2011  . UTI (lower urinary tract infection) 11/28/2011    PAST SURGICAL HISTORY: Past Surgical History  Procedure Laterality Date  . Mouth surgery  1997    gum surgery  . Cesarean section  1999    X 1  . Varicose vein surgery      [place] [facility]  . Portacath placement  03/20/2011    Procedure: INSERTION PORT-A-CATH;  Surgeon: Robyne Askew, MD;  Location: [facility];  Service: General;  Laterality: Right;  placement of port -Right Subclavian Vein  . Tubal ligation      1999  . Port-a-cath removal  08/07/2011  Procedure: REMOVAL PORT-A-CATH;  Surgeon: Robyne Askew, MD;  Location: [facility] OR;  Service: General;  Laterality: Right;  . Mastectomy w/ sentinel node biopsy  08/07/2011    Procedure: MASTECTOMY WITH SENTINEL LYMPH NODE BIOPSY;  Surgeon: Robyne Askew, MD;  Location: [facility] OR;  Service: General;  Laterality: Bilateral;  bilateral mastectomy  . Tissue expander placement  08/07/2011    Procedure: TISSUE  EXPANDER;  Surgeon: Etter Sjogren, MD;  Location: [facility] OR;  Service: Plastics;  Laterality: Bilateral;  BILATERAL PLACEMENT OF TISSUE EXPANDERS FOR BREAST RECONSTRUCTION  . Breast reconstruction Bilateral 03/24/2012    Procedure: BREAST RECONSTRUCTION WITH PLACEMENT OF SILICONE GEL IMPLANTS;  Surgeon: Etter Sjogren, MD;  Location: [facility] OR;  Service: Plastics;  Laterality: Bilateral;    FAMILY HISTORY Family History  Problem Relation Age of Onset  . Adopted: Yes    Gynecologic history: She had menarche age 78, was still having regular periods at the time of her diagnosis, but periods stopped with her chemotherapy. She is s/p BTL.She used birth control pills for approximately 10 years, with no clotting or other complications. She is GX P2. Age at first live birth was 53.   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 Her husband [name] is in accounting. Son [name] (pronounced with a hard "g") is 15 and in the ninth grade, daughter [name] is 13-1/7 grade.   ADVANCED DIRECTIVES: Not in place  HEALTH MAINTENANCE: History  Substance Use Topics  . Smoking status: Never Smoker   . Smokeless tobacco: Never Used  . Alcohol Use: 0.6 oz/week    1 Glasses of wine per week     Comment: occasionally- social     Colonoscopy:  PAP:  Bone density:  Lipid panel:  Allergies  Allergen Reactions  . Penicillins Rash    Current Outpatient Prescriptions  Medication Sig Dispense Refill  . Calcium Carbonate-Vitamin D (CALCIUM + D PO) Take 1 tablet by mouth 2 (two) times daily.        Marland Kitchen estradiol (VAGIFEM) 25 MCG vaginal tablet Place 1 tablet (25 mcg total) vaginally daily.  24 tablet  12  . IRON PO Take 1 tablet by mouth daily.      . tamoxifen (NOLVADEX) 20 MG tablet Take 20 mg by mouth daily.      Marland Kitchen zolpidem (AMBIEN) 10 MG tablet Take 3.33 mg by mouth at bedtime as needed for sleep. For sleep, pt takes 1/3 of tablet       No current facility-administered  medications for this visit.   Facility-Administered Medications Ordered in Other Visits  Medication Dose Route Frequency Provider Last Rate Last Dose  . clindamycin (CLEOCIN) IVPB 900 mg  900 mg Intravenous Once Etter Sjogren, MD        OBJECTIVE: Middle-aged white woman who looks well Filed Vitals:   05/14/12 1156  BP: 101/76  Pulse: 71  Temp: 97.8 F (36.6 C)  Resp: 20     Body mass index is 21.7 kg/(m^2).    ECOG FS: 0  Sclerae unicteric Oropharynx clear No cervical or supraclavicular adenopathy Lungs no rales or rhonchi Heart regular rate and rhythm Abd benign MSK no focal spinal tenderness, no peripheral edema Neuro: nonfocal, well-oriented, appropriate affect Breasts: She is status post bilateral mastectomies with subpectoral implants in place. The cosmetic result is excellent. The incisions have healed very nicely. There is no evidence of disease recurrence. Both axillae are benign.  LAB RESULTS: Lab  Results  Component Value Date   WBC 3.4* 05/07/2012   NEUTROABS 1.5 05/07/2012   HGB 12.3 05/07/2012   HCT 37.4 05/07/2012   MCV 95.2 05/07/2012   PLT 202 05/07/2012      Chemistry      Component Value Date/Time   NA 142 05/07/2012 1138   NA 141 10/03/2011 1200   K 4.0 05/07/2012 1138   K 3.6 10/03/2011 1200   CL 104 05/07/2012 1138   CL 105 10/03/2011 1200   CO2 30* 05/07/2012 1138   CO2 28 10/03/2011 1200   BUN 20.3 05/07/2012 1138   BUN 24* 10/03/2011 1200   CREATININE 0.8 05/07/2012 1138   CREATININE 0.8 10/03/2011 1200      Component Value Date/Time   CALCIUM 9.6 05/07/2012 1138   CALCIUM 9.7 10/03/2011 1200   ALKPHOS 52 05/07/2012 1138   ALKPHOS 141* 05/23/2011 1018   AST 21 05/07/2012 1138   AST 22 05/23/2011 1018   ALT 29 05/07/2012 1138   ALT 44* 05/23/2011 1018   BILITOT 0.39 05/07/2012 1138   BILITOT 0.3 05/23/2011 1018       Lab Results  Component Value Date   LABCA2 23 02/27/2011     STUDIES: No results found.     ASSESSMENT: 47 y.o.  [place] woman with  a BRCA mutation of uncertain significance  (1) status post left breast biopsy 01/24/ 2013, for a clinical T2 N0, stage IIA invasive ductal carcinoma, grade 2, strongly estrogen and progesterone receptor positive, HER-2 not amplified, with an MIB-1 of 30%.   (2) status post 4 doses of docetaxel/cyclophosphamide, completed 05/23/2011  (3) on tamoxifen since May 2013  (4) s/p bilateral mastectomies 08/07/2011 showing  (a) on the Right, atypical lobular hyperplasia, no malignancy  (b) on the left, a residual pT1c pN0 invasive ductal carcinoma, estrogen receptor 97% and progesterone receptor 85% positive, HER-2 not amplified.  (5) completed implant reconstruction February 2014   PLAN: Kenza is doing terrific, with no evidence of disease recurrence, and tolerating tamoxifen well.  We again discussed the possibility of bilateral salpingo-oophorectomy. She understands this is generally a laparoscopic an outpatient procedure, very similar to the bilateral tubal ligation she underwent in the past. Of course we do not know whether the variant of uncertain significance in her BRCA gene predisposes her to ovarian cancer or not. It may be years before we know one way or the other. However my recommendation to her, since we do not not a screen for ovarian cancer, is that she undergo bilateral salpingo-oophorectomy, which can be done by any gynecologist here in town, whenever she feels ready to do that.  At this point she feels she has gone through a lot and only just completed her reconstruction. She is going to do is wait and we can discuss this further when she returns to see [place] in 6 months. She knows to call for any problems that may develop before then.   Edd Reppert C    05/14/2012

## 2012-05-14 NOTE — Telephone Encounter (Signed)
, °

## 2012-07-03 ENCOUNTER — Other Ambulatory Visit: Payer: Self-pay | Admitting: Oncology

## 2012-07-03 DIAGNOSIS — C50919 Malignant neoplasm of unspecified site of unspecified female breast: Secondary | ICD-10-CM

## 2012-08-17 ENCOUNTER — Ambulatory Visit (INDEPENDENT_AMBULATORY_CARE_PROVIDER_SITE_OTHER): Payer: BC Managed Care – PPO | Admitting: General Surgery

## 2012-08-17 ENCOUNTER — Encounter (INDEPENDENT_AMBULATORY_CARE_PROVIDER_SITE_OTHER): Payer: Self-pay | Admitting: General Surgery

## 2012-08-17 VITALS — BP 120/70 | HR 66 | Temp 98.2°F | Resp 14 | Ht 66.0 in | Wt 137.8 lb

## 2012-08-17 DIAGNOSIS — C50212 Malignant neoplasm of upper-inner quadrant of left female breast: Secondary | ICD-10-CM

## 2012-08-17 DIAGNOSIS — C50219 Malignant neoplasm of upper-inner quadrant of unspecified female breast: Secondary | ICD-10-CM

## 2012-08-17 NOTE — Progress Notes (Signed)
Subjective:     Patient ID: Abigail [name], female   DOB: 11-Jan-1966, 47 y.o.   MRN: [id number]  HPI The patient is a 47 year old white female who is one year status post bilateral mastectomies and negative left sentinel node biopsy for a T1cN0 left breast cancer. Since her last visit she underwent exchange of the tissue expanders for implants. She tolerated this well. She denies any chest wall pain. She is still taking tamoxifen and tolerating it well. She is considering having her ovaries removed once she is not in any danger of losing her job if she is out again  Review of Systems  Constitutional: Negative.   HENT: Negative.   Eyes: Negative.   Respiratory: Negative.   Cardiovascular: Negative.   Gastrointestinal: Negative.   Endocrine: Negative.   Genitourinary: Negative.   Musculoskeletal: Negative.   Skin: Negative.   Allergic/Immunologic: Negative.   Neurological: Negative.   Hematological: Negative.   Psychiatric/Behavioral: Negative.        Objective:   Physical Exam  Constitutional: She is oriented to person, place, and time. She appears well-developed and well-nourished.  HENT:  Head: Normocephalic and atraumatic.  Eyes: Conjunctivae and EOM are normal. Pupils are equal, round, and reactive to light.  Neck: Normal range of motion. Neck supple.  Cardiovascular: Normal rate, regular rhythm and normal heart sounds.   Pulmonary/Chest: Effort normal and breath sounds normal.  There is no palpable mass in either reconstructed breast. There is no palpable axillary, supraclavicular, or cervical lymphadenopathy  Abdominal: Soft. Bowel sounds are normal. She exhibits no mass. There is no tenderness.  Musculoskeletal: Normal range of motion.  Lymphadenopathy:    She has no cervical adenopathy.  Neurological: She is alert and oriented to person, place, and time.  Skin: Skin is warm and dry.  Psychiatric: She has a normal mood and affect. Her behavior is normal.        Assessment:     The patient is one year status post bilateral mastectomy for breast cancer on the left     Plan:     At this point she will continue to take tamoxifen. She will continue to do regular self exams. She is considering having her ovaries removed. We will plan to see her back in about 6 months.

## 2012-08-17 NOTE — Patient Instructions (Signed)
Continue regular self exams Continue tamoxifen 

## 2012-11-12 ENCOUNTER — Telehealth: Payer: Self-pay | Admitting: *Deleted

## 2012-11-12 ENCOUNTER — Encounter: Payer: Self-pay | Admitting: Physician Assistant

## 2012-11-12 ENCOUNTER — Ambulatory Visit (INDEPENDENT_AMBULATORY_CARE_PROVIDER_SITE_OTHER): Payer: BC Managed Care – PPO | Admitting: Physician Assistant

## 2012-11-12 VITALS — BP 110/68 | HR 61 | Temp 97.8°F | Resp 16 | Ht 66.0 in | Wt 135.0 lb

## 2012-11-12 DIAGNOSIS — R5381 Other malaise: Secondary | ICD-10-CM

## 2012-11-12 DIAGNOSIS — J029 Acute pharyngitis, unspecified: Secondary | ICD-10-CM

## 2012-11-12 DIAGNOSIS — J069 Acute upper respiratory infection, unspecified: Secondary | ICD-10-CM | POA: Insufficient documentation

## 2012-11-12 NOTE — Patient Instructions (Signed)
Increase fluid intake.  Daily probiotic.  Salt water gargles.  Humidifier in the bedroom.  Tylenol for throat pain.  We will send your swab for culture and start antibiotics if culture comes back positive.  Please call if symptoms worsen or if you develop fever.  Viral Pharyngitis Viral pharyngitis is a viral infection that produces redness, pain, and swelling (inflammation) of the throat. It can spread from person to person (contagious). CAUSES Viral pharyngitis is caused by inhaling a large amount of certain germs called viruses. Many different viruses cause viral pharyngitis. SYMPTOMS Symptoms of viral pharyngitis include:  Sore throat.  Tiredness.  Stuffy nose.  Low-grade fever.  Congestion.  Cough. TREATMENT Treatment includes rest, drinking plenty of fluids, and the use of over-the-counter medication (approved by your caregiver). HOME CARE INSTRUCTIONS   Drink enough fluids to keep your urine clear or pale yellow.  Eat soft, cold foods such as ice cream, frozen ice pops, or gelatin dessert.  Gargle with warm salt water (1 tsp salt per 1 qt of water).  If over age 42, throat lozenges may be used safely.  Only take over-the-counter or prescription medicines for pain, discomfort, or fever as directed by your caregiver. Do not take aspirin. To help prevent spreading viral pharyngitis to others, avoid:  Mouth-to-mouth contact with others.  Sharing utensils for eating and drinking.  Coughing around others. SEEK MEDICAL CARE IF:   You are better in a few days, then become worse.  You have a fever or pain not helped by pain medicines.  There are any other changes that concern you. Document Released: 10/24/2004 Document Revised: 04/08/2011 Document Reviewed: 03/22/2010 [facility] Patient Information 2014 [place], [place].

## 2012-11-12 NOTE — Assessment & Plan Note (Signed)
Rapid strep negative.  Will send for culture.  Increase fluids.  Salt water gargles.  Daily probiotic.  Tylenol and chloraseptic for irritated throat.  Will treat with abx if culture comes back positive.

## 2012-11-12 NOTE — Telephone Encounter (Signed)
Pt called this am with sore throat and fever pt will see Malva Cogan PA this am at 11:30

## 2012-11-12 NOTE — Progress Notes (Signed)
Patient ID: Abigail [name], female   DOB: 10-Aug-1965, 47 y.o.   MRN: [id number]  Patient presents to clinic today c/o dry, sore throat with non productive cough x 3 days.  Patient denies fever, chills.  Endorses some fatigue.  Symptoms with gradual onset.  No significant myalgias or other URI symptoms.  Denies history of allergies, recent travel, new pet or recent sick contact.   Past Medical History  Diagnosis Date  . Chicken pox as a child  . Allergy     seasonal- spring  . Urticaria   . History of streptococcal pharyngitis   . History of mononucleosis   . Anemia     mild  . Allergic state 12/04/2010  . Preventative health care 12/04/2010  . Anxiety 02/11/2011  . Insomnia 02/11/2011  . 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 S/P chemotherapy, time since less than 4 weeks 05/23/11    completed 4 doses docetaxel/cyclophosphamide  . Hot flashes   . Vaginitis 10/08/2011  . UTI (lower urinary tract infection) 11/28/2011    Current Outpatient Prescriptions on File Prior to Visit  Medication Sig Dispense Refill  . Calcium Carbonate-Vitamin D (CALCIUM + D PO) Take 1 tablet by mouth 2 (two) times daily.        Marland Kitchen estradiol (VAGIFEM) 25 MCG vaginal tablet Place 1 tablet (25 mcg total) vaginally daily.  24 tablet  12  . IRON PO Take 1 tablet by mouth daily.      . tamoxifen (NOLVADEX) 20 MG tablet TAKE ONE TABLET BY MOUTH ONE TIME DAILY  30 tablet  PRN  . zolpidem (AMBIEN) 10 MG tablet Take 3.33 mg by mouth at bedtime as needed for sleep. For sleep, pt takes 1/3 of tablet       Current Facility-Administered Medications on File Prior to Visit  Medication Dose Route Frequency Provider Last Rate Last Dose  . clindamycin (CLEOCIN) IVPB 900 mg  900 mg Intravenous Once Etter Sjogren, MD        Allergies  Allergen  Reactions  . Penicillins Rash    Family History  Problem Relation Age of Onset  . Adopted: Yes    History   Social History  . Marital Status: Married    Spouse Name: N/A    Number of Children: 2  . Years of Education: N/A   Occupational History  . [employer]   Social History Main Topics  . Smoking status: Never Smoker   . Smokeless tobacco: Never Used  . Alcohol Use: 0.6 oz/week    1 Glasses of wine per week     Comment: occasionally- social  . Drug Use: No  . Sexual Activity: Yes    Birth Control/ Protection: Surgical   Other Topics Concern  . None   Social History Narrative  . None   ROS See HPI.  All other ROS are negative.  Filed Vitals:   11/12/12 1129  BP: 110/68  Pulse: 61  Temp: 97.8 F (36.6 C)  Resp: 16   Physical Exam  Vitals reviewed. Constitutional: She is oriented to person, place, and time and well-developed, well-nourished, and in no distress.  HENT:  Head: Normocephalic and atraumatic.  Right Ear: External ear normal.  Left Ear: External ear normal.  Nose: Nose normal.  Mouth/Throat: Oropharynx is clear and moist.  Mild posterior oropharyngeal erythema without tonsillar adenopathy, palatal petechia, uvula deviation.  One tiny white spot on left side of oropharynx.  No cervical adenopathy palpated on exam.  Eyes: Conjunctivae are normal.  Neck: Neck supple.  Cardiovascular: Normal rate, regular rhythm and normal heart sounds.   Pulmonary/Chest: Effort normal and breath sounds normal. No respiratory distress. She has no wheezes. She has no rales. She exhibits no tenderness.  Lymphadenopathy:    She has no cervical adenopathy.  Neurological: She is alert and oriented to person, place, and time.  Skin: Skin is warm and dry. No rash noted.  Psychiatric: Affect normal.     Recent Results (from the past 2160 hour(s))  POCT RAPID STREP A (OFFICE)     Status: None   Collection Time    11/12/12 11:59 AM      Result  Value Range   Rapid Strep A Screen Negative  Negative    Assessment/Plan: Viral pharyngitis Rapid strep negative.  Will send for culture.  Increase fluids.  Salt water gargles.  Daily probiotic.  Tylenol and chloraseptic for irritated throat.  Will treat with abx if culture comes back positive.

## 2012-11-18 ENCOUNTER — Other Ambulatory Visit (HOSPITAL_BASED_OUTPATIENT_CLINIC_OR_DEPARTMENT_OTHER): Payer: BC Managed Care – PPO | Admitting: Lab

## 2012-11-18 ENCOUNTER — Ambulatory Visit (HOSPITAL_BASED_OUTPATIENT_CLINIC_OR_DEPARTMENT_OTHER): Payer: BC Managed Care – PPO | Admitting: Oncology

## 2012-11-18 VITALS — BP 109/71 | HR 68 | Temp 98.3°F | Resp 18 | Ht 66.0 in | Wt 138.6 lb

## 2012-11-18 DIAGNOSIS — C50212 Malignant neoplasm of upper-inner quadrant of left female breast: Secondary | ICD-10-CM | POA: Insufficient documentation

## 2012-11-18 DIAGNOSIS — C50219 Malignant neoplasm of upper-inner quadrant of unspecified female breast: Secondary | ICD-10-CM

## 2012-11-18 DIAGNOSIS — Z170 Estrogen receptor positive status [ER+]: Secondary | ICD-10-CM

## 2012-11-18 DIAGNOSIS — Z9010 Acquired absence of unspecified breast and nipple: Secondary | ICD-10-CM

## 2012-11-18 LAB — CBC WITH DIFFERENTIAL/PLATELET
BASO%: 0.9 % (ref 0.0–2.0)
EOS%: 2.1 % (ref 0.0–7.0)
LYMPH%: 32.2 % (ref 14.0–49.7)
MCHC: 32.5 g/dL (ref 31.5–36.0)
MONO#: 0.5 10*3/uL (ref 0.1–0.9)
MONO%: 9 % (ref 0.0–14.0)
Platelets: 230 10*3/uL (ref 145–400)
RBC: 4.04 10*6/uL (ref 3.70–5.45)
WBC: 5.3 10*3/uL (ref 3.9–10.3)

## 2012-11-18 LAB — COMPREHENSIVE METABOLIC PANEL (CC13)
ALT: 25 U/L (ref 0–55)
AST: 20 U/L (ref 5–34)
Alkaline Phosphatase: 56 U/L (ref 40–150)
Sodium: 141 mEq/L (ref 136–145)
Total Bilirubin: 0.23 mg/dL (ref 0.20–1.20)
Total Protein: 7.4 g/dL (ref 6.4–8.3)

## 2012-11-18 NOTE — Telephone Encounter (Signed)
appts made and printed...td 

## 2012-11-18 NOTE — Progress Notes (Signed)
ID: [name]   DOB: Dec 10, 1965  MR#: [id number]  WUJ#:[id number]  PCP: Levon Hedger, MD SU: Chevis Pretty MD GYN: Candice Camp OTHER: Lonie Peak  HISTORY OF PRESENT ILLNESS: The patient is a 47 year old [place] woman who had screening mammography at [facility] 02/01/2011, showing calcifications in the left breast. She returned for diagnostic left mammography 02/21/2011, and this showed a 1.1 cm cluster of pleomorphic microcalcifications in the upper inner aspect of the left breast. Biopsy of this area was performed the same day, and showed ([id number]) An invasive ductal carcinoma, grade 2, which was estrogen receptor positive at 89%, progesterone receptor positive at 99%, with an MIB-1 of 30% and no HER-2 amplification. The patient was then set up for bilateral breast MRIs, performed 02/26/2011. There were multiple small cysts in both breasts. In the upper inner quadrant of the left breast there was a 2.7 cm enhancing mass with ill-defined margins. This contained a biopsy clip. There was a hematoma superior to this mass, measuring 2.2 cm. There were no other suspicious areas in either breast and no abnormal appearing lymph nodes.  With this information the patient's case was presented at the multidisciplinary breast cancer conference 02/27/2011, and she was seen in the [facility] clinic the same day. A decision was made to proceed with neoadjuvant treatment. Her subsequent history is as detailed below.  INTERVAL HISTORY: Jaquala returns today  followup of her breast cancer. The interval history is generally unremarkable. She has met with Dr. Rana Snare to discuss bilateral salpingo-oophorectomy versus hysterectomy, and also because of some breakthrough bleeding. It sounds like she is in the peri-menopausal stage and is having some endometrial lining thickening secondary to the tamoxifen. The plan accordingly on November 7 is going to be on bilateral salpingo-oophorectomy with a D&C performed at the  same time. Teretha as a good understanding of this plan and feels very comfortable with her decisions, as do I.  REVIEW OF SYSTEMS: Her hot flashes are mild her. She doesn't have any new changes. She has gained 5 pounds and understands that in the perimenopausal/menopausal. Significant weight gain can occur. 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 But she is now doing is taking to walk she was taking before. She is using Vagifem once a week and also a probiotic foam once a week, and that is helping with the intimacy issue. She finds these interventions adequate, but expensive. Aside from this, she is tolerating the tamoxifen well, and a detailed review of systems was noncontributory  PAST MEDICAL HISTORY: Past Medical History  Diagnosis Date  . Chicken pox as a child  . Allergy     seasonal- spring  . Urticaria   . History of streptococcal pharyngitis   . History of mononucleosis   . Anemia     mild  . Allergic state 12/04/2010  . Preventative health care 12/04/2010  . Anxiety 02/11/2011  . Insomnia 02/11/2011  . 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 S/P chemotherapy, time since less than 4 weeks 05/23/11    completed 4 doses docetaxel/cyclophosphamide  . Hot flashes   . Vaginitis 10/08/2011  . UTI (lower urinary tract infection) 11/28/2011    PAST SURGICAL HISTORY: Past Surgical History  Procedure Laterality Date  . Mouth surgery  1997  gum surgery  . Cesarean section  1999    X 1  . Varicose vein surgery      [place] [facility]  . Portacath placement  03/20/2011    Procedure: INSERTION PORT-A-CATH;  Surgeon: Robyne Askew, MD;  Location: [facility];  Service: General;  Laterality: Right;  placement of port -Right  Subclavian Vein  . Tubal ligation      1999  . Port-a-cath removal  08/07/2011    Procedure: REMOVAL PORT-A-CATH;  Surgeon: Robyne Askew, MD;  Location: [facility] OR;  Service: General;  Laterality: Right;  . Mastectomy w/ sentinel node biopsy  08/07/2011    Procedure: MASTECTOMY WITH SENTINEL LYMPH NODE BIOPSY;  Surgeon: Robyne Askew, MD;  Location: [facility] OR;  Service: General;  Laterality: Bilateral;  bilateral mastectomy  . Tissue expander placement  08/07/2011    Procedure: TISSUE EXPANDER;  Surgeon: Etter Sjogren, MD;  Location: [facility] OR;  Service: Plastics;  Laterality: Bilateral;  BILATERAL PLACEMENT OF TISSUE EXPANDERS FOR BREAST RECONSTRUCTION  . Breast reconstruction Bilateral 03/24/2012    Procedure: BREAST RECONSTRUCTION WITH PLACEMENT OF SILICONE GEL IMPLANTS;  Surgeon: Etter Sjogren, MD;  Location: [facility] OR;  Service: Plastics;  Laterality: Bilateral;    FAMILY HISTORY Family History  Problem Relation Age of Onset  . Adopted: Yes    Gynecologic history: She had menarche age 50, was still having regular periods at the time of her diagnosis, but periods stopped with her chemotherapy. She is s/p BTL.She used birth control pills for approximately 10 years, with no clotting or other complications. She is GX P2. Age at first live birth was 63.   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 Her husband [name] is in accounting. Son [name] (pronounced with a hard "g") is 15 and in the ninth grade, daughter [name] is 13-1/7 grade.   ADVANCED DIRECTIVES: Not in place  HEALTH MAINTENANCE: History  Substance Use Topics  . Smoking status: Never Smoker   . Smokeless tobacco: Never Used  . Alcohol Use: 0.6 oz/week    1 Glasses of wine per week     Comment: occasionally- social     Colonoscopy:  PAP:  Bone density:  Lipid panel:  Allergies  Allergen Reactions  . Penicillins Rash    Current Outpatient Prescriptions  Medication Sig Dispense Refill  .  Calcium Carbonate-Vitamin D (CALCIUM + D PO) Take 1 tablet by mouth 2 (two) times daily.        Marland Kitchen estradiol (VAGIFEM) 25 MCG vaginal tablet Place 1 tablet (25 mcg total) vaginally daily.  24 tablet  12  . IRON PO Take 1 tablet by mouth daily.      . tamoxifen (NOLVADEX) 20 MG tablet TAKE ONE TABLET BY MOUTH ONE TIME DAILY  30 tablet  PRN  . zolpidem (AMBIEN) 10 MG tablet Take 3.33 mg by mouth at bedtime as needed for sleep. For sleep, pt takes 1/3 of tablet       No current facility-administered medications for this visit.   Facility-Administered Medications Ordered in Other Visits  Medication Dose Route Frequency Provider Last Rate Last Dose  . clindamycin (CLEOCIN) IVPB 900 mg  900 mg Intravenous Once Etter Sjogren, MD        OBJECTIVE: Middle-aged white woman in no acute distress Filed Vitals:   11/18/12 1315  BP: 109/71  Pulse: 68  Temp: 98.3 F (36.8 C)  Resp: 18  Body mass index is 22.38 kg/(m^2).    ECOG FS: 0  Sclerae unicteric, pupils equal round and reactive to light Oropharynx clear, excellent dentition No cervical or supraclavicular adenopathy Lungs no rales or rhonchi Heart regular rate and rhythm Abd soft, nontender, positive bowel sounds MSK no focal spinal tenderness, no upper extremity lymphedema Neuro: nonfocal, well-oriented, appropriate affect Breasts: She is status post bilateral mastectomies with subpectoral implants in place. The cosmetic result is excellent. There is no evidence of disease recurrence. Both axillae are benign.  LAB RESULTS: Lab Results  Component Value Date   WBC 5.3 11/18/2012   NEUTROABS 3.0 11/18/2012   HGB 12.4 11/18/2012   HCT 38.0 11/18/2012   MCV 94.1 11/18/2012   PLT 230 11/18/2012      Chemistry      Component Value Date/Time   NA 142 05/07/2012 1138   NA 141 10/03/2011 1200   K 4.0 05/07/2012 1138   K 3.6 10/03/2011 1200   CL 104 05/07/2012 1138   CL 105 10/03/2011 1200   CO2 30* 05/07/2012 1138   CO2 28 10/03/2011 1200    BUN 20.3 05/07/2012 1138   BUN 24* 10/03/2011 1200   CREATININE 0.8 05/07/2012 1138   CREATININE 0.8 10/03/2011 1200      Component Value Date/Time   CALCIUM 9.6 05/07/2012 1138   CALCIUM 9.7 10/03/2011 1200   ALKPHOS 52 05/07/2012 1138   ALKPHOS 141* 05/23/2011 1018   AST 21 05/07/2012 1138   AST 22 05/23/2011 1018   ALT 29 05/07/2012 1138   ALT 44* 05/23/2011 1018   BILITOT 0.39 05/07/2012 1138   BILITOT 0.3 05/23/2011 1018       Lab Results  Component Value Date   LABCA2 23 02/27/2011     STUDIES: No results found.   ASSESSMENT: 47 y.o.  [place] woman with a BRCA mutation of uncertain significance  (1) status post left breast biopsy 01/24/ 2013, for a clinical T2 N0, stage IIA invasive ductal carcinoma, grade 2, strongly estrogen and progesterone receptor positive, HER-2 not amplified, with an MIB-1 of 30%.   (2) status post 4 doses of docetaxel/cyclophosphamide, completed 05/23/2011  (3) on tamoxifen since May 2013  (4) s/p bilateral mastectomies 08/07/2011 showing  (a) on the Right, atypical lobular hyperplasia, no malignancy  (b) on the left, a residual pT1c pN0 invasive ductal carcinoma, estrogen receptor 97% and progesterone receptor 85% positive, HER-2 not amplified.  (5) completed implant reconstruction February 2014  (6) bilateral salpingo-oophorectomy planned for November 2014   PLAN: Zenab is doing fine from a breast cancer point of view. I think given the uncertainty in her genetic situation, the bilateral salpingo-oophorectomy is very reasonable. When she sees me again in April, she will qualify for a switch to aromatase inhibitors and with that in view we are going to set her up for a bone density at the breast center March of 2015. Depending on those results she may or may not want to also receive zolendronate N., though her dentition is excellent, I have asked her to mention that to her dentist at the next cleaning just to make sure he has no concerns.  Otherwise  she knows to call for any problems that may develop before her next visit here.      Athalene Kolle C    11/18/2012

## 2012-12-03 ENCOUNTER — Other Ambulatory Visit: Payer: Self-pay

## 2012-12-04 ENCOUNTER — Other Ambulatory Visit: Payer: Self-pay | Admitting: Obstetrics and Gynecology

## 2013-01-13 ENCOUNTER — Encounter (INDEPENDENT_AMBULATORY_CARE_PROVIDER_SITE_OTHER): Payer: Self-pay | Admitting: General Surgery

## 2013-04-14 ENCOUNTER — Ambulatory Visit
Admission: RE | Admit: 2013-04-14 | Discharge: 2013-04-14 | Disposition: A | Discharge status: Home / Self Care | Payer: BC Managed Care – PPO | Source: Ambulatory Visit | Attending: Oncology | Admitting: Oncology

## 2013-04-14 DIAGNOSIS — C50212 Malignant neoplasm of upper-inner quadrant of left female breast: Secondary | ICD-10-CM

## 2013-05-21 NOTE — Telephone Encounter (Signed)
Please see Visit Info comments 

## 2013-05-23 ENCOUNTER — Other Ambulatory Visit: Payer: Self-pay | Admitting: *Deleted

## 2013-05-23 DIAGNOSIS — C50212 Malignant neoplasm of upper-inner quadrant of left female breast: Secondary | ICD-10-CM

## 2013-05-24 ENCOUNTER — Ambulatory Visit (HOSPITAL_BASED_OUTPATIENT_CLINIC_OR_DEPARTMENT_OTHER): Payer: BC Managed Care – PPO | Admitting: Oncology

## 2013-05-24 ENCOUNTER — Other Ambulatory Visit (HOSPITAL_BASED_OUTPATIENT_CLINIC_OR_DEPARTMENT_OTHER): Payer: BC Managed Care – PPO

## 2013-05-24 ENCOUNTER — Telehealth: Payer: Self-pay | Admitting: Oncology

## 2013-05-24 VITALS — BP 120/79 | HR 65 | Temp 98.1°F | Resp 18 | Ht 66.0 in | Wt 138.7 lb

## 2013-05-24 DIAGNOSIS — C50212 Malignant neoplasm of upper-inner quadrant of left female breast: Secondary | ICD-10-CM

## 2013-05-24 DIAGNOSIS — M8580 Other specified disorders of bone density and structure, unspecified site: Secondary | ICD-10-CM

## 2013-05-24 DIAGNOSIS — M810 Age-related osteoporosis without current pathological fracture: Secondary | ICD-10-CM | POA: Insufficient documentation

## 2013-05-24 DIAGNOSIS — M899 Disorder of bone, unspecified: Secondary | ICD-10-CM

## 2013-05-24 DIAGNOSIS — Z170 Estrogen receptor positive status [ER+]: Secondary | ICD-10-CM

## 2013-05-24 DIAGNOSIS — C50219 Malignant neoplasm of upper-inner quadrant of unspecified female breast: Secondary | ICD-10-CM

## 2013-05-24 DIAGNOSIS — M949 Disorder of cartilage, unspecified: Secondary | ICD-10-CM

## 2013-05-24 LAB — COMPREHENSIVE METABOLIC PANEL (CC13)
ALT: 20 U/L (ref 0–55)
AST: 24 U/L (ref 5–34)
Albumin: 4 g/dL (ref 3.5–5.0)
Alkaline Phosphatase: 52 U/L (ref 40–150)
Anion Gap: 7 mEq/L (ref 3–11)
BUN: 17.9 mg/dL (ref 7.0–26.0)
CALCIUM: 9.6 mg/dL (ref 8.4–10.4)
CO2: 28 mEq/L (ref 22–29)
Chloride: 106 mEq/L (ref 98–109)
Creatinine: 0.8 mg/dL (ref 0.6–1.1)
Glucose: 77 mg/dl (ref 70–140)
POTASSIUM: 4.1 meq/L (ref 3.5–5.1)
Sodium: 142 mEq/L (ref 136–145)
TOTAL PROTEIN: 6.7 g/dL (ref 6.4–8.3)
Total Bilirubin: 0.23 mg/dL (ref 0.20–1.20)

## 2013-05-24 LAB — CBC WITH DIFFERENTIAL/PLATELET
BASO%: 0.6 % (ref 0.0–2.0)
Basophils Absolute: 0 10*3/uL (ref 0.0–0.1)
EOS%: 3.5 % (ref 0.0–7.0)
Eosinophils Absolute: 0.2 10*3/uL (ref 0.0–0.5)
HCT: 35.9 % (ref 34.8–46.6)
HEMOGLOBIN: 11.7 g/dL (ref 11.6–15.9)
LYMPH#: 1.6 10*3/uL (ref 0.9–3.3)
LYMPH%: 33.2 % (ref 14.0–49.7)
MCH: 31.1 pg (ref 25.1–34.0)
MCHC: 32.6 g/dL (ref 31.5–36.0)
MCV: 95.6 fL (ref 79.5–101.0)
MONO#: 0.4 10*3/uL (ref 0.1–0.9)
MONO%: 8.1 % (ref 0.0–14.0)
NEUT#: 2.6 10*3/uL (ref 1.5–6.5)
NEUT%: 54.6 % (ref 38.4–76.8)
Platelets: 203 10*3/uL (ref 145–400)
RBC: 3.76 10*6/uL (ref 3.70–5.45)
RDW: 12 % (ref 11.2–14.5)
WBC: 4.7 10*3/uL (ref 3.9–10.3)

## 2013-05-24 NOTE — Progress Notes (Signed)
ID: Abigail [name]   DOB: [birth year]  MR#: [id number]  WER#:[id number]  PCP: Abigail Pillar, MD SU: Autumn Messing MD GYN: Abigail [name] OTHER: Abigail [name], Abigail [name]  HISTORY OF PRESENT ILLNESS: The patient is a 48 year old Abigail [name] woman who had screening mammography at [facility] 02/01/2011, showing calcifications in the left breast. She returned for diagnostic left mammography 02/21/2011, and this showed a 1.1 cm cluster of pleomorphic microcalcifications in the upper inner aspect of the left breast. Biopsy of this area was performed the same day, and showed ([id number]) An invasive ductal carcinoma, grade 2, which was estrogen receptor positive at 89%, progesterone receptor positive at 99%, with an MIB-1 of 30% and no HER-2 amplification. The patient was then set up for bilateral breast MRIs, performed 02/26/2011. There were multiple small cysts in both breasts. In the upper inner quadrant of the left breast there was a 2.7 cm enhancing mass with ill-defined margins. This contained a biopsy clip. There was a hematoma superior to this mass, measuring 2.2 cm. There were no other suspicious areas in either breast and no abnormal appearing lymph nodes.  With this information the patient's case was presented at the multidisciplinary breast cancer conference 02/27/2011, and she was seen in the [facility] clinic the same day. A decision was made to proceed with neoadjuvant treatment. Her subsequent history is as detailed below.  INTERVAL HISTORY: Abigail [name] returns today  followup of her breast cancer. The interval history is significant first of all for her having completed her reconstruction. She has been released by Abigail [name]. (She has also been released by Abigail [name]). She underwent hysterectomy with bilateral salpingo-oophorectomy in November. She did remarkably well with the surgery and surprisingly has had only minimal menopausal symptoms. Finally she had her first bone density ever, which showed mild  osteopenia. She is here today to consider switching to an aromatase inhibitor  REVIEW OF SYSTEMS: Abigail [name] has had no insomnia problems, hot flashes are not worse than before, and she uses Abigail [name] for vaginal dryness. She feels this works better than Vagifem. She exercises may be 30 minutes maybe 3 days a week. She finds she is not able to jump up and down or do jogging as she could before. It is just uncomfortable on her chest, particularly the next day. She did some roller coaster ing with her children (who are trying to do every single roller coaster in states) and both times she did she got a terrific headache that lasted about 5 minutes. She had never had this before. I think that is going to be due to sinusitis. Otherwise she has had no headaches, visual changes, nausea, vomiting, or gait imbalance. She continues to work full-time. A detailed review of systems today was otherwise noncontributory  PAST MEDICAL HISTORY: Past Medical History  Diagnosis Date  . Chicken pox as a child  . Allergy     seasonal- spring  . Urticaria   . History of streptococcal pharyngitis   . History of mononucleosis   . Anemia     mild  . Allergic state 12/04/2010  . Preventative health care 12/04/2010  . Anxiety 02/11/2011  . Insomnia 02/11/2011  . 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 S/P chemotherapy, time since less than 4 weeks 05/23/11    completed 4 doses docetaxel/cyclophosphamide  . Hot flashes   .  Vaginitis 10/08/2011  . UTI (lower urinary tract infection) 11/28/2011    PAST SURGICAL HISTORY: Past Surgical History  Procedure Laterality Date  . Mouth surgery  1997    gum surgery  . Cesarean section  1999    X 1  . Varicose vein surgery      [place] [facility]  . Portacath placement  03/20/2011    Procedure:  INSERTION PORT-A-CATH;  Surgeon: Abigail Roof, MD;  Location: [place];  Service: General;  Laterality: Right;  placement of port -Right Subclavian Vein  . Tubal ligation      1999  . Port-a-cath removal  08/07/2011    Procedure: REMOVAL PORT-A-CATH;  Surgeon: Abigail Roof, MD;  Location: [place];  Service: General;  Laterality: Right;  . Mastectomy w/ sentinel node biopsy  08/07/2011    Procedure: MASTECTOMY WITH SENTINEL LYMPH NODE BIOPSY;  Surgeon: Abigail Roof, MD;  Location: [place];  Service: General;  Laterality: Bilateral;  bilateral mastectomy  . Tissue expander placement  08/07/2011    Procedure: TISSUE EXPANDER;  Surgeon: Abigail Reese, MD;  Location: [place];  Service: Plastics;  Laterality: Bilateral;  BILATERAL PLACEMENT OF TISSUE EXPANDERS FOR BREAST RECONSTRUCTION  . Breast reconstruction Bilateral 03/24/2012    Procedure: BREAST RECONSTRUCTION WITH PLACEMENT OF SILICONE GEL IMPLANTS;  Surgeon: Abigail Reese, MD;  Location: [place];  Service: Plastics;  Laterality: Bilateral;    FAMILY HISTORY Family History  Problem Relation Age of Onset  . Adopted: Yes    Gynecologic history: She had menarche age 62, was still having regular periods at the time of her diagnosis, but periods stopped with her chemotherapy. She is s/p BTL.She used birth control pills for approximately 10 years, with no clotting or other complications. She is GX P2. Age at first live birth was 41. 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 Her husband Abigail [name] is in accounting. Son Abigail [name] (pronounced with a hard "g") is 15 and in the ninth grade, daughter Abigail [name] is 13-1/7 grade.   ADVANCED DIRECTIVES: Not in place  HEALTH MAINTENANCE: History  Substance Use Topics  . Smoking status: Never Smoker   . Smokeless tobacco: Never Used  . Alcohol Use: 0.6 oz/week     1 Glasses of wine per week     Comment: occasionally- social     Colonoscopy:  PAP:  Bone density:  Lipid panel:  Allergies  Allergen Reactions  . Penicillins Rash    Current Outpatient Prescriptions  Medication Sig Dispense Refill  . Calcium Carbonate-Vitamin D (CALCIUM + D PO) Take 1 tablet by mouth 2 (two) times daily.        Marland Kitchen estradiol (VAGIFEM) 25 MCG vaginal tablet Place 1 tablet (25 mcg total) vaginally daily.  24 tablet  12  . IRON PO Take 1 tablet by mouth daily.      . tamoxifen (NOLVADEX) 20 MG tablet TAKE ONE TABLET BY MOUTH ONE TIME DAILY  30 tablet  PRN  . zolpidem (AMBIEN) 10 MG tablet Take 3.33 mg by mouth at bedtime as needed for sleep. For sleep, pt takes 1/3 of tablet       No current facility-administered medications for this visit.   Facility-Administered Medications Ordered in Other Visits  Medication Dose Route Frequency Provider Last Rate Last Dose  . clindamycin (CLEOCIN) IVPB 900 mg  900 mg  Intravenous Once Abigail Reese, MD        OBJECTIVE: Middle-aged white woman who appears well Filed Vitals:   05/24/13 1351  BP: 120/79  Pulse: 65  Temp: 98.1 F (36.7 C)  Resp: 18     Body mass index is 22.4 kg/(m^2).    ECOG FS: 0  Sclerae unicteric, EOMs intact Oropharynx clear and moist No cervical or supraclavicular adenopathy Lungs no rales or rhonchi Heart regular rate and rhythm Abd soft, nontender, positive bowel sounds MSK no focal spinal tenderness, no upper extremity lymphedema Neuro: nonfocal, well-oriented, positive affect Breasts: She is status post bilateral mastectomies with subpectoral implants in place. The cosmetic result is excellent. There is no evidence of disease recurrence. Both axillae are benign.  LAB RESULTS: Lab Results  Component Value Date   WBC 4.7 05/24/2013   NEUTROABS 2.6 05/24/2013   HGB 11.7 05/24/2013   HCT 35.9 05/24/2013   MCV 95.6 05/24/2013   PLT 203 05/24/2013      Chemistry      Component Value  Date/Time   NA 141 11/18/2012 1259   NA 141 10/03/2011 1200   K 4.3 11/18/2012 1259   K 3.6 10/03/2011 1200   CL 104 05/07/2012 1138   CL 105 10/03/2011 1200   CO2 29 11/18/2012 1259   CO2 28 10/03/2011 1200   BUN 17.3 11/18/2012 1259   BUN 24* 10/03/2011 1200   CREATININE 0.9 11/18/2012 1259   CREATININE 0.8 10/03/2011 1200      Component Value Date/Time   CALCIUM 9.9 11/18/2012 1259   CALCIUM 9.7 10/03/2011 1200   ALKPHOS 56 11/18/2012 1259   ALKPHOS 141* 05/23/2011 1018   AST 20 11/18/2012 1259   AST 22 05/23/2011 1018   ALT 25 11/18/2012 1259   ALT 44* 05/23/2011 1018   BILITOT 0.23 11/18/2012 1259   BILITOT 0.3 05/23/2011 1018       Lab Results  Component Value Date   LABCA2 23 02/27/2011     STUDIES: EXAM:  DUAL X-RAY ABSORPTIOMETRY (DXA) FOR BONE MINERAL DENSITY  FINDINGS:  AP LUMBAR SPINE  Bone Mineral Density (BMD): 0.924 g/cm2  Young Adult T-Score: -1.1  Z-Score: -0.5  LEFT FEMUR NECK  Bone Mineral Density (BMD): 0.682 g/cm2  Young Adult T-Score: -1.5  Z-Score: -0.9  ASSESSMENT: Patient's diagnostic category is LOW BONE MASS by WHO  Criteria.  FRACTURE RISK: Moderate  FRAX: Based on the Basco model, the 10 year  probability of a major osteoporotic fracture is 3.5%. The 10 year  probability of a hip fracture is 0.3%.  COMPARISON: None.     ASSESSMENT: 48 y.o.  [place] woman with a BRCA mutation of uncertain significance  (1) status post left breast biopsy 01/24/ 2013, for a clinical T2 N0, stage IIA invasive ductal carcinoma, grade 2, strongly estrogen and progesterone receptor positive, HER-2 not amplified, with an MIB-1 of 30%.   (2) status post 4 doses of docetaxel/cyclophosphamide, completed 05/23/2011  (3) on tamoxifen since May 2013  (4) s/p bilateral mastectomies 08/07/2011 showing  (a) on the Right, atypical lobular hyperplasia, no malignancy  (b) on the left, a residual pT1c pN0 invasive ductal carcinoma, estrogen receptor  97% and progesterone receptor 85% positive, HER-2 not amplified.  (5) completed implant reconstruction February 2014  (6) s/p bilateral salpingo-oophorectomy November 2014 with benign pathology   PLAN: We went over her situation in detail today. She understands she has mild osteopenia and does not need week last or  bisphosphonates. She does need to continue to take her vitamin D and calcium and to walk ideally 45 minutes 5 times a week. We will repeat a bone density in 2 years.  The reason we got the bone density was so we could consider aromatase inhibitors. She is a candidate now since she underwent bilateral salpingo-oophorectomy in November. We talked about the possible toxicities, side effects and complications of those agents particularly issues relating to worsening vaginal dryness, and lowering the bone density.  After much discussion what she would like to do is received 10 years of treatment. A choice will be 5 years of tamoxifen followed by 5 years of an aromatase inhibitor versus continuing on tamoxifen for 5 years  Accordingly she is staying on tamoxifen. Also I reassured her regarding the headaches she had while on the roller coaster (she will avoid roller coaster for the next few months). I will see her again in October and at that point we may consider going up to once a year followup. Incidentally I gave her the information on her "pelvic health" program. She will let me know her evaluation if she does choose to participate.    Virgie Dad Magrinat    05/24/2013

## 2013-05-24 NOTE — Telephone Encounter (Signed)
gv pt appt schedule for oct.  °

## 2013-05-25 NOTE — Addendum Note (Signed)
Addended by: Laureen Abrahams on: 05/25/2013 12:00 PM   Modules accepted: Orders

## 2013-07-10 ENCOUNTER — Encounter: Payer: Self-pay | Admitting: Emergency Medicine

## 2013-07-10 ENCOUNTER — Emergency Department
Admission: EM | Admit: 2013-07-10 | Discharge: 2013-07-10 | Disposition: A | Discharge status: Home / Self Care | Payer: BC Managed Care – PPO | Source: Home / Self Care | Attending: Family Medicine | Admitting: Family Medicine

## 2013-07-10 DIAGNOSIS — R300 Dysuria: Secondary | ICD-10-CM

## 2013-07-10 LAB — POCT CBC W AUTO DIFF (K'VILLE URGENT CARE)

## 2013-07-10 LAB — POCT URINALYSIS DIP (MANUAL ENTRY)
BILIRUBIN UA: NEGATIVE
Glucose, UA: NEGATIVE
Nitrite, UA: NEGATIVE
PH UA: 5.5 (ref 5–8)
SPEC GRAV UA: 1.025 (ref 1.005–1.03)
Urobilinogen, UA: 0.2 (ref 0–1)

## 2013-07-10 MED ORDER — NITROFURANTOIN MONOHYD MACRO 100 MG PO CAPS: 100.0000 mg | ORAL_CAPSULE | Freq: Two times a day (BID) | ORAL | Status: DC

## 2013-07-10 NOTE — Discharge Instructions (Signed)
Increase fluid intake.  May take AZO (non-prescription) if necessary for urinary symptoms.  If symptoms become significantly worse during the night or over the weekend, proceed to the local emergency room.

## 2013-07-10 NOTE — ED Provider Notes (Signed)
CSN: [id number]     Arrival date & time 07/10/13  1020 History   First MD Initiated Contact with Patient 07/10/13 1045     Chief Complaint  Patient presents with  . Fever     HPI Comments: Five days ago patient noticed some mild dysuria and frequency which seemed to resolved.  About 3 or 4 days ago she developed fatigue, myalgias, chills and fever up to 102, and a mild low back ache.  Her fever has been intermittent.  At 4am today her temperature was 102.  No GI or respiratory symptoms.  Patient is a 48 y.o. female presenting with dysuria. The history is provided by the patient.  Dysuria Pain quality:  Burning Pain severity:  Mild Onset quality:  Sudden Duration:  5 days Timing:  Rare Progression:  Improving Chronicity:  New Recent urinary tract infections: no   Relieved by:  None tried Worsened by:  Nothing tried Ineffective treatments:  None tried Urinary symptoms: frequent urination   Urinary symptoms: no discolored urine, no foul-smelling urine, no hematuria, no hesitancy and no bladder incontinence   Associated symptoms: fever   Associated symptoms: no abdominal pain, no flank pain, no nausea, no vaginal discharge and no vomiting     Past Medical History  Diagnosis Date  . Chicken pox as a child  . Allergy     seasonal- spring  . Urticaria   . History of streptococcal pharyngitis   . History of mononucleosis   . Anemia     mild  . Allergic state 12/04/2010  . Preventative health care 12/04/2010  . Anxiety 02/11/2011  . Insomnia 02/11/2011  . 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 S/P chemotherapy, time since less than 4 weeks 05/23/11    completed 4 doses docetaxel/cyclophosphamide  . Hot flashes   . Vaginitis 10/08/2011  . UTI (lower urinary tract infection) 11/28/2011   Past Surgical  History  Procedure Laterality Date  . Mouth surgery  1997    gum surgery  . Cesarean section  1999    X 1  . Varicose vein surgery      [place] [facility]  . Portacath placement  03/20/2011    Procedure: INSERTION PORT-A-CATH;  Surgeon: Merrie Roof, MD;  Location: [place];  Service: General;  Laterality: Right;  placement of port -Right Subclavian Vein  . Tubal ligation      1999  . Port-a-cath removal  08/07/2011    Procedure: REMOVAL PORT-A-CATH;  Surgeon: Merrie Roof, MD;  Location: [place];  Service: General;  Laterality: Right;  . Mastectomy w/ sentinel node biopsy  08/07/2011    Procedure: MASTECTOMY WITH SENTINEL LYMPH NODE BIOPSY;  Surgeon: Merrie Roof, MD;  Location: [place];  Service: General;  Laterality: Bilateral;  bilateral mastectomy  . Tissue expander placement  08/07/2011    Procedure: TISSUE EXPANDER;  Surgeon: Crissie Reese, MD;  Location: [place];  Service: Plastics;  Laterality: Bilateral;  BILATERAL PLACEMENT OF TISSUE EXPANDERS FOR BREAST RECONSTRUCTION  . Breast reconstruction Bilateral 03/24/2012    Procedure: BREAST RECONSTRUCTION WITH PLACEMENT OF SILICONE GEL IMPLANTS;  Surgeon: Crissie Reese, MD;  Location: [place];  Service: Plastics;  Laterality: Bilateral;   Family History  Problem Relation Age of Onset  . Adopted: Yes  History  Substance Use Topics  . Smoking status: Never Smoker   . Smokeless tobacco: Never Used  . Alcohol Use: 0.6 oz/week    1 Glasses of wine per week     Comment: occasionally- social   OB History   Grav Para Term Preterm Abortions TAB SAB Ect Mult Living   2 2             Obstetric Comments   1st mensus age 1, age 27 first live birth last mensus 02/23/11, birth control pills age [age]     Review of Systems  Constitutional: Positive for fever.  Gastrointestinal: Negative for nausea, vomiting and abdominal pain.  Genitourinary: Positive for dysuria. Negative for flank pain and vaginal discharge.   All other systems reviewed and are negative.   Allergies  Penicillins  Home Medications   Prior to Admission medications   Medication Sig Start Date End Date Taking? Authorizing Provider  Calcium Carbonate-Vitamin D (CALCIUM + D PO) Take 1 tablet by mouth 2 (two) times daily.      Historical Provider, MD  estradiol (VAGIFEM) 25 MCG vaginal tablet Place 25 mcg vaginally as needed. 05/14/12   Chauncey Cruel, MD  IRON PO Take 1 tablet by mouth daily.    Historical Provider, MD  nitrofurantoin, macrocrystal-monohydrate, (MACROBID) 100 MG capsule Take 1 capsule (100 mg total) by mouth 2 (two) times daily. Take with food. 07/10/13   Kandra Nicolas, MD  tamoxifen (NOLVADEX) 20 MG tablet TAKE ONE TABLET BY MOUTH ONE TIME DAILY 07/03/12   Chauncey Cruel, MD  zolpidem (AMBIEN) 10 MG tablet Take 3.33 mg by mouth at bedtime as needed for sleep. For sleep, pt takes 1/3 of tablet    Historical Provider, MD   BP 98/64  Pulse 86  Temp(Src) 98.2 F (36.8 C) (Oral)  Resp 16  Ht 5\' 6"  (1.676 m)  Wt 135 lb (61.236 kg)  BMI 21.80 kg/m2  SpO2 99% Physical Exam Nursing notes and Vital Signs reviewed. Appearance:  Patient appears healthy, stated age, and in no acute distress Eyes:  Pupils are equal, round, and reactive to light and accomodation.  Extraocular movement is intact.  Conjunctivae are not inflamed  Ears:  Canals normal.  Tympanic membranes normal.  Nose:   Normal turbinates Pharynx:  Normal Neck:  Supple.   No adenopathy Lungs:  Clear to auscultation.  Breath sounds are equal.  Heart:  Regular rate and rhythm without murmurs, rubs, or gallops.  Abdomen:  Nontender without masses or hepatosplenomegaly.  Bowel sounds are present.  No CVA or flank tenderness.  Extremities:  No edema.  No calf tenderness Skin:  No rash present.   ED Course  Procedures  none    Labs Reviewed  URINE CULTURE  POCT URINALYSIS DIP (MANUAL ENTRY):  KET 40mg /dL; BLO small; PRO trace; LEU small, otherwise  negative  POCT CBC W AUTO DIFF ([facility]):  WBC 8.3; LY 18.2; MO 4.2; GR 77.6; Hgb 11.4; Platelets 164      MDM   1. Dysuria; suspect cystitis.  Normal white blood count reassuring    Urine culture pending.  Begin Macrobid. Increase fluid intake.  May take AZO (non-prescription) if necessary for urinary symptoms.  If symptoms become significantly worse during the night or over the weekend, proceed to the local emergency room.  Followup with Family Doctor if not improved in one week.     Kandra Nicolas, MD 07/10/13 [phone]

## 2013-07-10 NOTE — ED Notes (Signed)
Reports intermittent fever x 5 days; some signs of UTI in beginning, but not currently. Took tylenol today at 9:30am for temp of 102.

## 2013-07-12 LAB — URINE CULTURE: Colony Count: >=100000

## 2013-07-13 ENCOUNTER — Telehealth: Payer: Self-pay | Admitting: Emergency Medicine

## 2013-07-31 ENCOUNTER — Other Ambulatory Visit: Payer: Self-pay | Admitting: Oncology

## 2013-11-11 ENCOUNTER — Encounter: Payer: Self-pay | Admitting: *Deleted

## 2013-11-18 ENCOUNTER — Telehealth: Payer: Self-pay | Admitting: Oncology

## 2013-11-18 NOTE — Telephone Encounter (Signed)
, °

## 2013-11-19 ENCOUNTER — Telehealth: Payer: Self-pay | Admitting: Oncology

## 2013-11-19 NOTE — Telephone Encounter (Signed)
, °

## 2013-11-22 ENCOUNTER — Other Ambulatory Visit: Payer: BC Managed Care – PPO

## 2013-11-22 ENCOUNTER — Ambulatory Visit: Payer: BC Managed Care – PPO | Admitting: Oncology

## 2013-11-29 ENCOUNTER — Encounter: Payer: Self-pay | Admitting: Emergency Medicine

## 2013-12-30 ENCOUNTER — Other Ambulatory Visit (HOSPITAL_BASED_OUTPATIENT_CLINIC_OR_DEPARTMENT_OTHER): Payer: BC Managed Care – PPO

## 2013-12-30 ENCOUNTER — Telehealth: Payer: Self-pay | Admitting: Oncology

## 2013-12-30 ENCOUNTER — Ambulatory Visit (HOSPITAL_BASED_OUTPATIENT_CLINIC_OR_DEPARTMENT_OTHER): Payer: BC Managed Care – PPO | Admitting: Oncology

## 2013-12-30 VITALS — BP 118/71 | HR 68 | Temp 98.1°F | Resp 18 | Ht 66.0 in | Wt 135.2 lb

## 2013-12-30 DIAGNOSIS — M8580 Other specified disorders of bone density and structure, unspecified site: Secondary | ICD-10-CM

## 2013-12-30 DIAGNOSIS — C50212 Malignant neoplasm of upper-inner quadrant of left female breast: Secondary | ICD-10-CM

## 2013-12-30 DIAGNOSIS — N9489 Other specified conditions associated with female genital organs and menstrual cycle: Secondary | ICD-10-CM

## 2013-12-30 DIAGNOSIS — Z170 Estrogen receptor positive status [ER+]: Secondary | ICD-10-CM

## 2013-12-30 LAB — COMPREHENSIVE METABOLIC PANEL (CC13)
ALT: 21 U/L (ref 0–55)
ANION GAP: 7 meq/L (ref 3–11)
AST: 21 U/L (ref 5–34)
Albumin: 4.1 g/dL (ref 3.5–5.0)
Alkaline Phosphatase: 49 U/L (ref 40–150)
BUN: 20.1 mg/dL (ref 7.0–26.0)
CALCIUM: 10.1 mg/dL (ref 8.4–10.4)
CHLORIDE: 106 meq/L (ref 98–109)
CO2: 30 mEq/L — ABNORMAL HIGH (ref 22–29)
CREATININE: 0.8 mg/dL (ref 0.6–1.1)
EGFR: 82 mL/min/{1.73_m2} — ABNORMAL LOW (ref >90)
Glucose: 90 mg/dl (ref 70–140)
Potassium: 4.5 mEq/L (ref 3.5–5.1)
Sodium: 143 mEq/L (ref 136–145)
Total Bilirubin: 0.42 mg/dL (ref 0.20–1.20)
Total Protein: 6.8 g/dL (ref 6.4–8.3)

## 2013-12-30 LAB — CBC WITH DIFFERENTIAL/PLATELET
BASO%: 0.6 % (ref 0.0–2.0)
Basophils Absolute: 0 10*3/uL (ref 0.0–0.1)
EOS%: 2.8 % (ref 0.0–7.0)
Eosinophils Absolute: 0.1 10*3/uL (ref 0.0–0.5)
HEMATOCRIT: 39.2 % (ref 34.8–46.6)
HEMOGLOBIN: 12.6 g/dL (ref 11.6–15.9)
LYMPH%: 34.2 % (ref 14.0–49.7)
MCH: 30.7 pg (ref 25.1–34.0)
MCHC: 32.2 g/dL (ref 31.5–36.0)
MCV: 95.3 fL (ref 79.5–101.0)
MONO#: 0.4 10*3/uL (ref 0.1–0.9)
MONO%: 10 % (ref 0.0–14.0)
NEUT#: 2.3 10*3/uL (ref 1.5–6.5)
NEUT%: 52.4 % (ref 38.4–76.8)
PLATELETS: 205 10*3/uL (ref 145–400)
RBC: 4.11 10*6/uL (ref 3.70–5.45)
RDW: 12.3 % (ref 11.2–14.5)
WBC: 4.5 10*3/uL (ref 3.9–10.3)
lymph#: 1.5 10*3/uL (ref 0.9–3.3)

## 2013-12-30 MED ORDER — ESTRADIOL 2 MG VA RING: 2.0000 mg | VAGINAL_RING | VAGINAL | Status: DC

## 2013-12-30 MED ORDER — ZOLPIDEM TARTRATE 10 MG PO TABS: 3.3300 mg | ORAL_TABLET | Freq: Every evening | ORAL | Status: DC | PRN

## 2013-12-30 MED ORDER — TAMOXIFEN CITRATE 20 MG PO TABS: 20.0000 mg | ORAL_TABLET | Freq: Every day | ORAL | Status: DC

## 2013-12-30 NOTE — Telephone Encounter (Signed)
cld pt & left message-mailed copy of sch

## 2013-12-30 NOTE — Progress Notes (Signed)
ID: [name]   DOB: 12/10/1965  MR#: [id number]  WUJ#:[id number]  PCP: Kelton Pillar, MD SU: Autumn Messing MD GYN: Louretta Shorten OTHER: Eppie Gibson, Crissie Reese  HISTORY OF PRESENT ILLNESS: From the original intake note:  The patient is a 48 year old [place] woman who had screening mammography at [facility] 02/01/2011, showing calcifications in the left breast. She returned for diagnostic left mammography 02/21/2011, and this showed a 1.1 cm cluster of pleomorphic microcalcifications in the upper inner aspect of the left breast. Biopsy of this area was performed the same day, and showed ([id number]) An invasive ductal carcinoma, grade 2, which was estrogen receptor positive at 89%, progesterone receptor positive at 99%, with an MIB-1 of 30% and no HER-2 amplification. The patient was then set up for bilateral breast MRIs, performed 02/26/2011. There were multiple small cysts in both breasts. In the upper inner quadrant of the left breast there was a 2.7 cm enhancing mass with ill-defined margins. This contained a biopsy clip. There was a hematoma superior to this mass, measuring 2.2 cm. There were no other suspicious areas in either breast and no abnormal appearing lymph nodes.  With this information the patient's case was presented at the multidisciplinary breast cancer conference 02/27/2011, and she was seen in the [facility] clinic the same day. A decision was made to proceed with neoadjuvant treatment.   Her subsequent history is as detailed below.  INTERVAL HISTORY: Linzie returns today  followup of her breast cancer. The interval history is generally unremarkable. 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 The cancer "growing up" and she is tolerating tamoxifen generally well. 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  REVIEW OF SYSTEMS: Adriene has been working on the vaginal dryness issue, and  tried Vagifem suppositories. This had stopped to $4 a day, so if he uses it most days it's $1200 a year or so. It also wasn't working so well. She was using a different product for a while, which did not contain estrogen, but then the company has narrowed the choices and basically is now just like K-Y jelly. She is using a foam product currently with some success. Aside from these issues a detailed review of systems today was entirely negative.  PAST MEDICAL HISTORY: Past Medical History  Diagnosis Date  . Chicken pox as a child  . Allergy     seasonal- spring  . Urticaria   . History of streptococcal pharyngitis   . History of mononucleosis   . Anemia     mild  . Allergic state 12/04/2010  . Preventative health care 12/04/2010  . Anxiety 02/11/2011  . Insomnia 02/11/2011  . 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 S/P chemotherapy, time since less than 4 weeks 05/23/11    completed 4 doses docetaxel/cyclophosphamide  . Hot flashes   . Vaginitis 10/08/2011  . UTI (lower urinary tract infection) 11/28/2011    PAST SURGICAL HISTORY: Past Surgical History  Procedure Laterality Date  . Mouth surgery  1997    gum surgery  . Cesarean section  1999    X 1  . Varicose vein surgery      [place] [facility]  . Portacath placement  03/20/2011    Procedure: INSERTION PORT-A-CATH;  Surgeon: Merrie Roof, MD;  Location: [place];  Service: General;  Laterality: Right;  placement of port -Right Subclavian Vein  . Tubal ligation      1999  . Port-a-cath removal  08/07/2011    Procedure: REMOVAL PORT-A-CATH;  Surgeon: Merrie Roof, MD;  Location: [place];  Service: General;  Laterality: Right;  . Mastectomy w/ sentinel node biopsy  08/07/2011    Procedure: MASTECTOMY WITH SENTINEL LYMPH NODE  BIOPSY;  Surgeon: Merrie Roof, MD;  Location: [place];  Service: General;  Laterality: Bilateral;  bilateral mastectomy  . Tissue expander placement  08/07/2011    Procedure: TISSUE EXPANDER;  Surgeon: Crissie Reese, MD;  Location: [place];  Service: Plastics;  Laterality: Bilateral;  BILATERAL PLACEMENT OF TISSUE EXPANDERS FOR BREAST RECONSTRUCTION  . Breast reconstruction Bilateral 03/24/2012    Procedure: BREAST RECONSTRUCTION WITH PLACEMENT OF SILICONE GEL IMPLANTS;  Surgeon: Crissie Reese, MD;  Location: [place];  Service: Plastics;  Laterality: Bilateral;    FAMILY HISTORY Family History  Problem Relation Age of Onset  . Adopted: Yes    Gynecologic history: She had menarche age 15, was still having regular periods at the time of her diagnosis, but periods stopped with her chemotherapy. She is s/p BTL.She used birth control pills for approximately 10 years, with no clotting or other complications. She is GX P2. Age at first live birth was 65. 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 Her husband Annie Main is in accounting. Son Ladonna Snide (pronounced with a hard "g") is 15 and in the ninth grade, daughter Raquel Sarna is 13-1/7 grade.   ADVANCED DIRECTIVES: Not in place  HEALTH MAINTENANCE: History  Substance Use Topics  . Smoking status: Never Smoker   . Smokeless tobacco: Never Used  . Alcohol Use: 0.6 oz/week    1 Glasses of wine per week     Comment: occasionally- social     Colonoscopy:  PAP:  Bone density:  Lipid panel:  Allergies  Allergen Reactions  . Penicillins Rash    Current Outpatient Prescriptions  Medication Sig Dispense Refill  . Calcium Carbonate-Vitamin D (CALCIUM + D PO) Take 1 tablet by mouth 2 (two) times daily.      Marland Kitchen estradiol (VAGIFEM) 25 MCG vaginal tablet Place 25 mcg vaginally as needed.    . IRON PO Take 1 tablet by  mouth daily.    . nitrofurantoin, macrocrystal-monohydrate, (MACROBID) 100 MG capsule Take 1 capsule (100 mg total) by mouth 2 (two) times daily. Take with food. 14 capsule 0  . tamoxifen (NOLVADEX) 20 MG tablet Take one tablet by mouth one time daily 30 tablet PRN  . zolpidem (AMBIEN) 10 MG tablet Take 3.33 mg by mouth at bedtime as needed for sleep. For sleep, pt takes 1/3 of tablet     No current facility-administered medications for this visit.   Facility-Administered Medications Ordered in Other Visits  Medication Dose Route Frequency Provider Last Rate Last Dose  . clindamycin (CLEOCIN) IVPB 900 mg  900 mg Intravenous Once Crissie Reese, MD        OBJECTIVE: Middle-aged white woman in no acute distress Filed Vitals:   12/30/13 1003  BP: 118/71  Pulse: 68  Temp: 98.1 F (36.7 C)  Resp: 18     Body mass index is 21.83 kg/(m^2).    ECOG FS: 0  Sclerae  unicteric, pupils round and equal Oropharynx clear, teeth in good repair No cervical or supraclavicular adenopathy Lungs no rales or rhonchi Heart regular rate and rhythm Abd soft, nontender, positive bowel sounds MSK no focal spinal tenderness, no upper extremity lymphedema Neuro: nonfocal, well-oriented, positive affect Breasts: She is status post bilateral mastectomies with subpectoral implants in place.There is no evidence of disease recurrence. Both axillae are benign.  LAB RESULTS: Lab Results  Component Value Date   WBC 4.5 12/30/2013   NEUTROABS 2.3 12/30/2013   HGB 12.6 12/30/2013   HCT 39.2 12/30/2013   MCV 95.3 12/30/2013   PLT 205 12/30/2013      Chemistry      Component Value Date/Time   NA 142 05/24/2013 1344   NA 141 10/03/2011 1200   K 4.1 05/24/2013 1344   K 3.6 10/03/2011 1200   CL 104 05/07/2012 1138   CL 105 10/03/2011 1200   CO2 28 05/24/2013 1344   CO2 28 10/03/2011 1200   BUN 17.9 05/24/2013 1344   BUN 24* 10/03/2011 1200   CREATININE 0.8 05/24/2013 1344   CREATININE 0.8 10/03/2011 1200       Component Value Date/Time   CALCIUM 9.6 05/24/2013 1344   CALCIUM 9.7 10/03/2011 1200   ALKPHOS 52 05/24/2013 1344   ALKPHOS 141* 05/23/2011 1018   AST 24 05/24/2013 1344   AST 22 05/23/2011 1018   ALT 20 05/24/2013 1344   ALT 44* 05/23/2011 1018   BILITOT 0.23 05/24/2013 1344   BILITOT 0.3 05/23/2011 1018       Lab Results  Component Value Date   LABCA2 23 02/27/2011     STUDIES: No results found.  ASSESSMENT: 48 y.o.  [place] woman with a BRCA mutation of uncertain significance  (1) status post left breast biopsy 01/24/ 2013, for a clinical T2 N0, stage IIA invasive ductal carcinoma, grade 2, strongly estrogen and progesterone receptor positive, HER-2 not amplified, with an MIB-1 of 30%.   (2) status post 4 doses of docetaxel/cyclophosphamide, completed 05/23/2011  (3) on tamoxifen since May 2013  (4) s/p bilateral mastectomies 08/07/2011 showing  (a) on the Right, atypical lobular hyperplasia, no malignancy  (b) on the left, a residual pT1c pN0 invasive ductal carcinoma, estrogen receptor 97% and progesterone receptor 85% positive, HER-2 not amplified.  (5) completed implant reconstruction February 2014  (6) s/p bilateral salpingo-oophorectomy November 2014 with benign pathology   PLAN: The most troubling issue for her currently is vaginal dryness. I gave her information on our "intimacy and pelvic health" program, and also reviewed the use of local estrogen preparations while on tamoxifen.   The problem she had with Vagifem suppositories was cost, as well as the inconvenience of having to do this frequently. I wonder if she would do better with Estring. I went ahead and wrote her the prescription. 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  Otherwise the plan is to continue tamoxifen for a total of 10 years. If she wishes to switch to an aromatase inhibitor after 5 years that is also an option  but she would not be able to use local estrogen preparations in that case.  Blanka has a good understanding of the overall plan. She agrees with it. She knows the goal of treatment in her case is cure. She will call with any problems that may develop before the next visit here.   MAGRINAT,GUSTAV C    12/30/2013

## 2014-11-09 ENCOUNTER — Other Ambulatory Visit: Payer: Self-pay

## 2014-11-09 DIAGNOSIS — C50212 Malignant neoplasm of upper-inner quadrant of left female breast: Secondary | ICD-10-CM

## 2014-11-10 ENCOUNTER — Other Ambulatory Visit (HOSPITAL_BASED_OUTPATIENT_CLINIC_OR_DEPARTMENT_OTHER): Payer: BLUE CROSS/BLUE SHIELD

## 2014-11-10 ENCOUNTER — Telehealth: Payer: Self-pay | Admitting: Oncology

## 2014-11-10 ENCOUNTER — Ambulatory Visit (HOSPITAL_BASED_OUTPATIENT_CLINIC_OR_DEPARTMENT_OTHER): Payer: BLUE CROSS/BLUE SHIELD | Admitting: Oncology

## 2014-11-10 VITALS — BP 166/66 | HR 68 | Temp 98.0°F | Resp 18 | Ht 66.0 in | Wt 137.3 lb

## 2014-11-10 DIAGNOSIS — Z79810 Long term (current) use of selective estrogen receptor modulators (SERMs): Secondary | ICD-10-CM | POA: Diagnosis not present

## 2014-11-10 DIAGNOSIS — Z1501 Genetic susceptibility to malignant neoplasm of breast: Secondary | ICD-10-CM | POA: Diagnosis not present

## 2014-11-10 DIAGNOSIS — M8580 Other specified disorders of bone density and structure, unspecified site: Secondary | ICD-10-CM

## 2014-11-10 DIAGNOSIS — Z170 Estrogen receptor positive status [ER+]: Secondary | ICD-10-CM

## 2014-11-10 DIAGNOSIS — C50212 Malignant neoplasm of upper-inner quadrant of left female breast: Secondary | ICD-10-CM

## 2014-11-10 LAB — CBC WITH DIFFERENTIAL/PLATELET
BASO%: 1 % (ref 0.0–2.0)
Basophils Absolute: 0 10*3/uL (ref 0.0–0.1)
EOS ABS: 0.1 10*3/uL (ref 0.0–0.5)
EOS%: 2.9 % (ref 0.0–7.0)
HEMATOCRIT: 37.8 % (ref 34.8–46.6)
HGB: 12.5 g/dL (ref 11.6–15.9)
LYMPH#: 1.6 10*3/uL (ref 0.9–3.3)
LYMPH%: 39.4 % (ref 14.0–49.7)
MCH: 31.3 pg (ref 25.1–34.0)
MCHC: 33 g/dL (ref 31.5–36.0)
MCV: 94.7 fL (ref 79.5–101.0)
MONO#: 0.3 10*3/uL (ref 0.1–0.9)
MONO%: 7.4 % (ref 0.0–14.0)
NEUT#: 2 10*3/uL (ref 1.5–6.5)
NEUT%: 49.3 % (ref 38.4–76.8)
PLATELETS: 192 10*3/uL (ref 145–400)
RBC: 3.99 10*6/uL (ref 3.70–5.45)
RDW: 12.1 % (ref 11.2–14.5)
WBC: 4.1 10*3/uL (ref 3.9–10.3)

## 2014-11-10 LAB — COMPREHENSIVE METABOLIC PANEL (CC13)
ALT: 20 U/L (ref 0–55)
AST: 21 U/L (ref 5–34)
Albumin: 4.1 g/dL (ref 3.5–5.0)
Alkaline Phosphatase: 41 U/L (ref 40–150)
Anion Gap: 7 mEq/L (ref 3–11)
BILIRUBIN TOTAL: 0.6 mg/dL (ref 0.20–1.20)
BUN: 18.2 mg/dL (ref 7.0–26.0)
CALCIUM: 9.8 mg/dL (ref 8.4–10.4)
CO2: 29 mEq/L (ref 22–29)
CREATININE: 0.8 mg/dL (ref 0.6–1.1)
Chloride: 105 mEq/L (ref 98–109)
EGFR: 84 mL/min/{1.73_m2} — ABNORMAL LOW (ref >90)
Glucose: 88 mg/dl (ref 70–140)
Potassium: 4.3 mEq/L (ref 3.5–5.1)
Sodium: 141 mEq/L (ref 136–145)
Total Protein: 6.7 g/dL (ref 6.4–8.3)

## 2014-11-10 MED ORDER — ESTRADIOL 2 MG VA RING: 2.0000 mg | VAGINAL_RING | VAGINAL | Status: DC

## 2014-11-10 MED ORDER — ZOLPIDEM TARTRATE 10 MG PO TABS: 3.3300 mg | ORAL_TABLET | Freq: Every evening | ORAL | Status: DC | PRN

## 2014-11-10 MED ORDER — TAMOXIFEN CITRATE 20 MG PO TABS: 20.0000 mg | ORAL_TABLET | Freq: Every day | ORAL | Status: DC

## 2014-11-10 NOTE — Telephone Encounter (Signed)
Appointments made and avs printed for pateint °

## 2014-11-10 NOTE — Progress Notes (Signed)
ID: [name]   DOB: November 17, 1965  MR#: [id number]  HCW#:[id number]  PCP: Penni Homans, MD SU: Autumn Messing MD GYN: Louretta Shorten OTHER: Eppie Gibson, Crissie Reese  HISTORY OF PRESENT ILLNESS: From the original intake note:  The patient is a 49 year old [place] woman who had screening mammography at [facility] 02/01/2011, showing calcifications in the left breast. She returned for diagnostic left mammography 02/21/2011, and this showed a 1.1 cm cluster of pleomorphic microcalcifications in the upper inner aspect of the left breast. Biopsy of this area was performed the same day, and showed ([id number]) An invasive ductal carcinoma, grade 2, which was estrogen receptor positive at 89%, progesterone receptor positive at 99%, with an MIB-1 of 30% and no HER-2 amplification. The patient was then set up for bilateral breast MRIs, performed 02/26/2011. There were multiple small cysts in both breasts. In the upper inner quadrant of the left breast there was a 2.7 cm enhancing mass with ill-defined margins. This contained a biopsy clip. There was a hematoma superior to this mass, measuring 2.2 cm. There were no other suspicious areas in either breast and no abnormal appearing lymph nodes.  With this information the patient's case was presented at the multidisciplinary breast cancer conference 02/27/2011, and she was seen in the [facility] clinic the same day. A decision was made to proceed with neoadjuvant treatment.   Her subsequent history is as detailed below.  INTERVAL HISTORY: Abigail [name] returns today  followup of her breast cancer.  The interval history is stable. She is on tamoxifen, with good tolerance. Hot flashes are not a major issue. She is also on Estring, which is working well for her. That is more expensive, but she still getting it at a good price relatively speaking through our pharmacy.  REVIEW OF SYSTEMS: Abigail [name] is doing well at work in her family is doing fine. Distress comes from hearing  about so many people with breast cancer and of course one of our nurses from [place] did die from breast cancer earlier this year. All these things weight on her mind she exercises regularly chiefly by walking with her husband every evening. A detailed review of systems was otherwise stable  PAST MEDICAL HISTORY: Past Medical History  Diagnosis Date  . Chicken pox as a child  . Allergy     seasonal- spring  . Urticaria   . History of streptococcal pharyngitis   . History of mononucleosis   . Anemia     mild  . Allergic state 12/04/2010  . Preventative health care 12/04/2010  . Anxiety 02/11/2011  . Insomnia 02/11/2011  . 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 S/P chemotherapy, time since less than 4 weeks 05/23/11    completed 4 doses docetaxel/cyclophosphamide  . Hot flashes   . Vaginitis 10/08/2011  . UTI (lower urinary tract infection) 11/28/2011    PAST SURGICAL HISTORY: Past Surgical History  Procedure Laterality Date  . Mouth surgery  1997    gum surgery  . Cesarean section  1999    X 1  . Varicose vein surgery      [place] [facility]  . Portacath placement  03/20/2011    Procedure: INSERTION PORT-A-CATH;  Surgeon: Merrie Roof, MD;  Location: [place];  Service: General;  Laterality: Right;  placement of  port -Right Subclavian Vein  . Tubal ligation      1999  . Port-a-cath removal  08/07/2011    Procedure: REMOVAL PORT-A-CATH;  Surgeon: Merrie Roof, MD;  Location: [name];  Service: General;  Laterality: Right;  . Mastectomy w/ sentinel node biopsy  08/07/2011    Procedure: MASTECTOMY WITH SENTINEL LYMPH NODE BIOPSY;  Surgeon: Merrie Roof, MD;  Location: [place];  Service: General;  Laterality: Bilateral;  bilateral mastectomy  . Tissue expander placement   08/07/2011    Procedure: TISSUE EXPANDER;  Surgeon: Crissie Reese, MD;  Location: [place];  Service: Plastics;  Laterality: Bilateral;  BILATERAL PLACEMENT OF TISSUE EXPANDERS FOR BREAST RECONSTRUCTION  . Breast reconstruction Bilateral 03/24/2012    Procedure: BREAST RECONSTRUCTION WITH PLACEMENT OF SILICONE GEL IMPLANTS;  Surgeon: Crissie Reese, MD;  Location: [place];  Service: Plastics;  Laterality: Bilateral;    FAMILY HISTORY Family History  Problem Relation Age of Onset  . Adopted: Yes    Gynecologic history: She had menarche age 41, was still having regular periods at the time of her diagnosis, but periods stopped with her chemotherapy. She is s/p BTL.She used birth control pills for approximately 10 years, with no clotting or other complications. She is GX P2. Age at first live birth was 48. 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 Her husband Abigail [name] is in accounting. Son Abigail [name] (pronounced with a hard "g") is 43 and i if fresh Abigail [name] in college studying are not a Recruitment consultant, daughter Abigail [name] is high school junior  ADVANCED DIRECTIVES: Not in place  HEALTH MAINTENANCE: Social History  Substance Use Topics  . Smoking status: Never Smoker   . Smokeless tobacco: Never Used  . Alcohol Use: 0.6 oz/week    1 Glasses of wine per week     Comment: occasionally- social     Colonoscopy:  PAP:  Bone density:  Lipid panel:  Allergies  Allergen Reactions  . Penicillins Rash    Current Outpatient Prescriptions  Medication Sig Dispense Refill  . estradiol (ESTRING) 2 MG vaginal ring Place 2 mg vaginally every 3 (three) months. follow package directions 1 each 12  . tamoxifen (NOLVADEX) 20 MG tablet Take 1 tablet (20 mg total) by mouth daily. 90 tablet PRN  . zolpidem (AMBIEN) 10 MG tablet Take 0.5 tablets (5 mg total) by mouth at  bedtime as needed for sleep. For sleep, pt takes 1/3 of tablet 30 tablet 0   No current facility-administered medications for this visit.    OBJECTIVE: Middle-aged white woman  Who appears well  Filed Vitals:   11/10/14 1201  BP: 166/66  Pulse: 68  Temp: 98 F (36.7 C)  Resp: 18     Body mass index is 22.17 kg/(m^2).    ECOG FS: 0  Sclerae unicteric, EOMs intact Oropharynx clear, dentition in good repair No cervical or supraclavicular adenopathy Lungs no rales or rhonchi Heart regular rate and rhythm Abd soft, nontender, positive bowel sounds MSK no focal spinal tenderness, no upper extremity lymphedema Neuro: nonfocal, well oriented, appropriate affect Breasts:  Status post bilateral mastectomies with implant reconstruction. There is no evidence of local recurrence. Both axillae are benign.   LAB RESULTS: Lab Results  Component Value Date   WBC 4.1 11/10/2014   NEUTROABS 2.0 11/10/2014   HGB 12.5 11/10/2014   HCT 37.8 11/10/2014  MCV 94.7 11/10/2014   PLT 192 11/10/2014      Chemistry      Component Value Date/Time   NA 143 12/30/2013 0951   NA 141 10/03/2011 1200   K 4.5 12/30/2013 0951   K 3.6 10/03/2011 1200   CL 104 05/07/2012 1138   CL 105 10/03/2011 1200   CO2 30* 12/30/2013 0951   CO2 28 10/03/2011 1200   BUN 20.1 12/30/2013 0951   BUN 24* 10/03/2011 1200   CREATININE 0.8 12/30/2013 0951   CREATININE 0.8 10/03/2011 1200      Component Value Date/Time   CALCIUM 10.1 12/30/2013 0951   CALCIUM 9.7 10/03/2011 1200   ALKPHOS 49 12/30/2013 0951   ALKPHOS 141* 05/23/2011 1018   AST 21 12/30/2013 0951   AST 22 05/23/2011 1018   ALT 21 12/30/2013 0951   ALT 44* 05/23/2011 1018   BILITOT 0.42 12/30/2013 0951   BILITOT 0.3 05/23/2011 1018       Lab Results  Component Value Date   LABCA2 23 02/27/2011     STUDIES: No results found.  ASSESSMENT: 49 y.o.  [place] woman with a BRCA mutation of uncertain significance  (1) status post left  breast biopsy 01/24/ 2013, for a clinical T2 N0, stage IIA invasive ductal carcinoma, grade 2, strongly estrogen and progesterone receptor positive, HER-2 not amplified, with an MIB-1 of 30%.   (2) status post 4 doses of docetaxel/ cyclophosphamide, completed 05/23/2011  (3) on tamoxifen since May 2013  (4) s/p bilateral mastectomies 08/07/2011 showing  (a) on the Right, atypical lobular hyperplasia, no malignancy  (b) on the left, a residual pT1c pN0 invasive ductal carcinoma, estrogen receptor 97% and progesterone receptor 85% positive, HER-2 not amplified.  (5) completed implant reconstruction February 2014  (6) s/p bilateral salpingo-oophorectomy November 2014 with benign pathology   PLAN:  Abigail [name] is now 3 years out from her definitive surgery with no evidence of recurrent breast cancer. This is very favorable.  The plan is to continue tamoxifen for a total of 10 years.  So long as she continues on tamoxifen she can continue on Estring. She is getting it at a good price through our pharmacy but I wonder if she could do better at [facility] where she is a member. I went ahead and wrote her a paper prescription so she can " shop it " there as well.   she knows to call for any other problems that may develop before her next visit here.   MAGRINAT,GUSTAV C    11/10/2014

## 2015-02-13 ENCOUNTER — Ambulatory Visit (INDEPENDENT_AMBULATORY_CARE_PROVIDER_SITE_OTHER): Payer: BLUE CROSS/BLUE SHIELD | Admitting: Medical

## 2015-02-13 ENCOUNTER — Ambulatory Visit (HOSPITAL_BASED_OUTPATIENT_CLINIC_OR_DEPARTMENT_OTHER)
Admission: RE | Admit: 2015-02-13 | Discharge: 2015-02-13 | Disposition: A | Discharge status: Home / Self Care | Payer: BLUE CROSS/BLUE SHIELD | Source: Ambulatory Visit | Attending: Medical | Admitting: Medical

## 2015-02-13 ENCOUNTER — Telehealth: Payer: Self-pay | Admitting: Medical

## 2015-02-13 ENCOUNTER — Encounter: Payer: Self-pay | Admitting: Medical

## 2015-02-13 ENCOUNTER — Other Ambulatory Visit: Payer: BLUE CROSS/BLUE SHIELD

## 2015-02-13 VITALS — BP 118/78 | HR 75 | Temp 98.1°F | Ht 66.0 in | Wt 137.4 lb

## 2015-02-13 DIAGNOSIS — R509 Fever, unspecified: Secondary | ICD-10-CM | POA: Diagnosis not present

## 2015-02-13 DIAGNOSIS — R5383 Other fatigue: Secondary | ICD-10-CM | POA: Insufficient documentation

## 2015-02-13 DIAGNOSIS — R059 Cough, unspecified: Secondary | ICD-10-CM

## 2015-02-13 DIAGNOSIS — R05 Cough: Secondary | ICD-10-CM

## 2015-02-13 DIAGNOSIS — R0600 Dyspnea, unspecified: Secondary | ICD-10-CM

## 2015-02-13 DIAGNOSIS — M791 Myalgia: Secondary | ICD-10-CM | POA: Diagnosis not present

## 2015-02-13 DIAGNOSIS — M609 Myositis, unspecified: Secondary | ICD-10-CM | POA: Diagnosis not present

## 2015-02-13 DIAGNOSIS — IMO0001 Reserved for inherently not codable concepts without codable children: Secondary | ICD-10-CM

## 2015-02-13 LAB — CBC WITH DIFFERENTIAL/PLATELET
BASOS PCT: 0.5 % (ref 0.0–3.0)
Basophils Absolute: 0 10*3/uL (ref 0.0–0.1)
EOS PCT: 3 % (ref 0.0–5.0)
Eosinophils Absolute: 0.1 10*3/uL (ref 0.0–0.7)
HCT: 40.5 % (ref 36.0–46.0)
Hemoglobin: 13.2 g/dL (ref 12.0–15.0)
LYMPHS ABS: 1.8 10*3/uL (ref 0.7–4.0)
Lymphocytes Relative: 38 % (ref 12.0–46.0)
MCHC: 32.7 g/dL (ref 30.0–36.0)
MCV: 94.1 fl (ref 78.0–100.0)
MONOS PCT: 7.3 % (ref 3.0–12.0)
Monocytes Absolute: 0.3 10*3/uL (ref 0.1–1.0)
NEUTROS PCT: 51.2 % (ref 43.0–77.0)
Neutro Abs: 2.4 10*3/uL (ref 1.4–7.7)
Platelets: 224 10*3/uL (ref 150.0–400.0)
RBC: 4.31 Mil/uL (ref 3.87–5.11)
RDW: 12.4 % (ref 11.5–15.5)
WBC: 4.8 10*3/uL (ref 4.0–10.5)

## 2015-02-13 LAB — COMPREHENSIVE METABOLIC PANEL
ALBUMIN: 4.4 g/dL (ref 3.6–5.1)
ALK PHOS: 45 U/L (ref 33–115)
ALT: 38 U/L — AB (ref 6–29)
AST: 34 U/L (ref 10–35)
BUN: 16 mg/dL (ref 7–25)
CALCIUM: 10.7 mg/dL — AB (ref 8.6–10.2)
CO2: 29 mmol/L (ref 20–31)
Chloride: 101 mmol/L (ref 98–110)
Creat: 0.85 mg/dL (ref 0.50–1.10)
Glucose, Bld: 88 mg/dL (ref 65–99)
POTASSIUM: 3.6 mmol/L (ref 3.5–5.3)
Sodium: 141 mmol/L (ref 135–146)
TOTAL PROTEIN: 7.3 g/dL (ref 6.1–8.1)
Total Bilirubin: 0.4 mg/dL (ref 0.2–1.2)

## 2015-02-13 LAB — POCT INFLUENZA A/B
INFLUENZA B, POC: NEGATIVE
Influenza A, POC: NEGATIVE

## 2015-02-13 LAB — D-DIMER, QUANTITATIVE: D-Dimer, Quant: 0.27 ug/mL-FEU (ref 0.00–0.48)

## 2015-02-13 LAB — TROPONIN I: TNIDX: 0 ug/l (ref 0.00–0.06)

## 2015-02-13 MED ORDER — ALBUTEROL SULFATE HFA 108 (90 BASE) MCG/ACT IN AERS: 2.0000 | INHALATION_SPRAY | Freq: Four times a day (QID) | RESPIRATORY_TRACT | Status: DC | PRN

## 2015-02-13 MED ORDER — BENZONATATE 100 MG PO CAPS: 100.0000 mg | ORAL_CAPSULE | Freq: Three times a day (TID) | ORAL | Status: DC | PRN

## 2015-02-13 MED ORDER — IPRATROPIUM-ALBUTEROL 0.5-2.5 (3) MG/3ML IN SOLN
3.0000 mL | Freq: Once | RESPIRATORY_TRACT | Status: AC
  Administered 2015-02-13: 3 mL via RESPIRATORY_TRACT

## 2015-02-13 MED ORDER — AZITHROMYCIN 250 MG PO TABS: ORAL_TABLET | ORAL | Status: DC

## 2015-02-13 MED ORDER — BECLOMETHASONE DIPROPIONATE 40 MCG/ACT IN AERS: 2.0000 | INHALATION_SPRAY | Freq: Two times a day (BID) | RESPIRATORY_TRACT | Status: DC

## 2015-02-13 MED FILL — TAMOXIFEN 20 MG TABLET: 20 | 30 days supply | Qty: 30 | Fill #0

## 2015-02-13 MED FILL — BENZONATATE 100 MG CAPSULE: 100 | 7 days supply | Qty: 21 | Fill #0

## 2015-02-13 MED FILL — VENTOLIN HFA 90 MCG INHALER: 108 (90 BAS | 30 days supply | Qty: 18 | Fill #0

## 2015-02-13 MED FILL — QVAR 40 MCG ORAL INHALER: 40 | 30 days supply | Qty: 9 | Fill #0

## 2015-02-13 MED FILL — AZITHROMYCIN 250 MG TABLET: 250 | 5 days supply | Qty: 6 | Fill #0

## 2015-02-13 NOTE — Telephone Encounter (Signed)
I advised pt on her lab today and cxr. The cmp is pending and will update her on that when lab is in.

## 2015-02-13 NOTE — Patient Instructions (Addendum)
Will get labs to evaluate infection fighting cells and chest xray to see if you have walking pneumonia. Will start azithromycin antibiotic.  For fatigue include cmp with labs.  Flu test negative today.(viral syndrome symptoms this past Thursday)  For possible reactive airways start  qvar inhaler and albuterol inhaler. Duo neb treatment given in office.  For tight chest sensation and mild pressure at time labs to include troponin and d-dimer.  Follow up 7 days or as needed

## 2015-02-13 NOTE — Progress Notes (Signed)
Subjective:    Patient ID: Abigail [name], female    DOB: April 08, 1965, 50 y.o.   MRN: [id number]  HPI   Pt in with dry hacky cough for 2 wks. Last 10 days feels tired. No fever, no chills or sweats last 2 wks.  Pt has been feeling some tight sensation to chest. When heat comes on feels lungs tighten up.  Also when she climbs stairs feels lungs tighten up. Pt not hearing herself wheeze. Pt has no leg pain.   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 Some body aches that day.    Sometimes in winter since she got cancer she will feel short of breath with indoor heat from unit.  But she states this will eventually taper off.    Review of Systems  Constitutional: Positive for fever and fatigue. Negative for chills.  HENT: Negative for congestion, ear pain, hearing loss, nosebleeds, postnasal drip and rhinorrhea.   Respiratory: Positive for cough, chest tightness and shortness of breath. Negative for wheezing.        At times feeling light headed after climbing flight of steps.  Cardiovascular: Negative for chest pain and palpitations.       A;lso at times would report vague chest pressure sensation. But not during interview.  Gastrointestinal: Negative for abdominal pain and abdominal distention.  Musculoskeletal: Negative for back pain.       Myalgia last Thursday for about one hour.  Skin: Negative for pallor and rash.  Neurological: Negative for dizziness, numbness and headaches.  Hematological: Negative for adenopathy. Does not bruise/bleed easily.  Psychiatric/Behavioral: Negative for behavioral problems and confusion.    Past Medical History  Diagnosis Date  . Chicken pox as a child  . Allergy     seasonal- spring  . Urticaria   . History of streptococcal pharyngitis   . History of mononucleosis   . Anemia     mild  . Allergic state 12/04/2010  . Preventative health care 12/04/2010  .  Anxiety 02/11/2011  . Insomnia 02/11/2011  . Cancer ([place]) 02/21/11    Lt breast CA,BXINVASIVE DUCTAL CA GRADE 2,ER/PR=POSITIVE  . Breast cancer ([place]) 06/05/11    RIGHT  BX,POSTERIOR ,SLIGHT LATERAL=FOCAL ATYPIA LOBULAR HYPERPLASIA IN BACKGROUND OF EXTENSIVE STROMAL FIBROSIS  . S/P chemotherapy, time since less than 4 weeks 05/23/11    completed 4 doses docetaxel/cyclophosphamide  . Hot flashes   . Vaginitis 10/08/2011  . UTI (lower urinary tract infection) 11/28/2011    Social History   Social History  . Marital Status: Married    Spouse Name: N/A  . Number of Children: 2  . Years of Education: N/A   Occupational History  . [employer]   Social History Main Topics  . Smoking status: Never Smoker   . Smokeless tobacco: Never Used  . Alcohol Use: 0.6 oz/week    1 Glasses of wine per week     Comment: occasionally- social  . Drug Use: No  . Sexual Activity: Yes    Birth Control/ Protection: Surgical   Other Topics Concern  . Not on file   Social History Narrative    Past Surgical History  Procedure Laterality Date  . Mouth surgery  1997    gum surgery  . Cesarean section  1999    X 1  . Varicose vein surgery      [facility]  . Portacath  placement  03/20/2011    Procedure: INSERTION PORT-A-CATH;  Surgeon: Merrie Roof, MD;  Location: [facility];  Service: General;  Laterality: Right;  placement of port -Right Subclavian Vein  . Tubal ligation      1999  . Port-a-cath removal  08/07/2011    Procedure: REMOVAL PORT-A-CATH;  Surgeon: Merrie Roof, MD;  Location: [place] [facility];  Service: General;  Laterality: Right;  . Mastectomy w/ sentinel node biopsy  08/07/2011    Procedure: MASTECTOMY WITH SENTINEL LYMPH NODE BIOPSY;  Surgeon: Merrie Roof, MD;  Location: [place];  Service: General;  Laterality: Bilateral;  bilateral mastectomy  . Tissue expander placement  08/07/2011    Procedure: TISSUE EXPANDER;  Surgeon: Crissie Reese, MD;   Location: [facility] [place];  Service: Plastics;  Laterality: Bilateral;  BILATERAL PLACEMENT OF TISSUE EXPANDERS FOR BREAST RECONSTRUCTION  . Breast reconstruction Bilateral 03/24/2012    Procedure: BREAST RECONSTRUCTION WITH PLACEMENT OF SILICONE GEL IMPLANTS;  Surgeon: Crissie Reese, MD;  Location: [facility];  Service: Plastics;  Laterality: Bilateral;    Family History  Problem Relation Age of Onset  . Adopted: Yes    Allergies  Allergen Reactions  . Penicillins Rash    Current Outpatient Prescriptions on File Prior to Visit  Medication Sig Dispense Refill  . estradiol (ESTRING) 2 MG vaginal ring Place 2 mg vaginally every 3 (three) months. follow package directions 1 each 12  . tamoxifen (NOLVADEX) 20 MG tablet Take 1 tablet (20 mg total) by mouth daily. 90 tablet PRN  . zolpidem (AMBIEN) 10 MG tablet Take 0.5 tablets (5 mg total) by mouth at bedtime as needed for sleep. For sleep, pt takes 1/3 of tablet 30 tablet 0   No current facility-administered medications on file prior to visit.    BP 118/78 mmHg  Pulse 75  Temp(Src) 98.1 F (36.7 C) (Oral)  Ht 5\' 6"  (1.676 m)  Wt 137 lb 6.4 oz (62.324 kg)  BMI 22.19 kg/m2  SpO2 97%       Objective:   Physical Exam   General  Mental Status - Alert. General Appearance - Well groomed. Not in acute distress.  Skin Rashes- No Rashes.  HEENT Head- Normal. Ear Auditory Canal - Left- Normal. Right - Normal.Tympanic Membrane- Left- Normal. Right- Normal. Eye Sclera/Conjunctiva- Left- Normal. Right- Normal. Nose & Sinuses Nasal Mucosa- Left-  Not boggy or Congested. Right-  Not  boggy or Congested. No sinus pressure  Mouth & Throat Lips: Upper Lip- Normal: no dryness, cracking, pallor, cyanosis, or vesicular eruption. Lower Lip-Normal: no dryness, cracking, pallor, cyanosis or vesicular eruption. Buccal Mucosa- Bilateral- No Aphthous ulcers. Oropharynx- No Discharge or Erythema. Tonsils: Characteristics- Bilateral- No Erythema or  Congestion. Size/Enlargement- Bilateral- No enlargement. Discharge- bilateral-None.  Neck Neck- Supple. No Masses.   Chest and Lung Exam Auscultation: Breath Sounds:- even and unlabored.  Cardiovascular Auscultation:Rythm- Regular, rate and rhythm. Murmurs & Other Heart Sounds:Ausculatation of the heart reveal- No Murmurs.  Lymphatic Head & Neck General Head & Neck Lymphatics: Bilateral: Description- No Localized lymphadenopathy.  Lower extremity- calves are symmetric both sides. Negative homans signs. No pedal edema.      Assessment & Plan:  Home number the best.  Ekg. Sinus rhythm. Mild enlarged p wave ii, III, AVF. negaive t i v1 and v3. No other negative t. Could not find old ekg to compare.  Will get labs to evaluate infection fighting cells and chest xray to see if you have walking pneumonia.  Will start azithromycin antibiotic.  For fatigue include cmp with labs.  Flu test negative today.(viral syndrome symptoms this past Thursday)  For possible reactive airways start  qvar inhaler and albuterol inhaler. Duo neb treatment given in office.  For tight chest sensation and mild pressure at time labs to include troponin and d-dimer.  Follow up 7 days or as needed

## 2015-02-13 NOTE — Progress Notes (Signed)
Pre visit review using our clinic review tool, if applicable. No additional management support is needed unless otherwise documented below in the visit note. 

## 2015-03-21 MED FILL — TAMOXIFEN 20 MG TABLET: 20 | 30 days supply | Qty: 30 | Fill #1

## 2015-03-28 ENCOUNTER — Telehealth: Payer: Self-pay | Admitting: Family Medicine

## 2015-03-28 NOTE — Telephone Encounter (Signed)
LM for pt to call and schedule flu shot or update records. °

## 2015-04-26 MED FILL — TAMOXIFEN 20 MG TABLET: 20 | 30 days supply | Qty: 30 | Fill #2

## 2015-05-23 MED FILL — TAMOXIFEN 20 MG TABLET: 20 | 30 days supply | Qty: 30 | Fill #3

## 2015-06-23 ENCOUNTER — Other Ambulatory Visit: Payer: Self-pay | Admitting: Family Medicine

## 2015-06-23 ENCOUNTER — Ambulatory Visit: Payer: BLUE CROSS/BLUE SHIELD | Admitting: Family Medicine

## 2015-06-23 ENCOUNTER — Other Ambulatory Visit: Payer: Managed Care, Other (non HMO)

## 2015-06-23 ENCOUNTER — Ambulatory Visit (INDEPENDENT_AMBULATORY_CARE_PROVIDER_SITE_OTHER): Payer: Managed Care, Other (non HMO) | Admitting: Medical

## 2015-06-23 ENCOUNTER — Encounter: Payer: Self-pay | Admitting: Medical

## 2015-06-23 VITALS — BP 114/72 | HR 78 | Temp 98.1°F | Ht 66.0 in | Wt 138.0 lb

## 2015-06-23 DIAGNOSIS — R300 Dysuria: Secondary | ICD-10-CM

## 2015-06-23 DIAGNOSIS — R319 Hematuria, unspecified: Secondary | ICD-10-CM | POA: Diagnosis not present

## 2015-06-23 DIAGNOSIS — N390 Urinary tract infection, site not specified: Secondary | ICD-10-CM

## 2015-06-23 LAB — POC URINALSYSI DIPSTICK (AUTOMATED)
BILIRUBIN UA: NEGATIVE
GLUCOSE UA: NEGATIVE
Ketones, UA: NEGATIVE
NITRITE UA: NEGATIVE
Spec Grav, UA: 1.025
Urobilinogen, UA: 0.2
pH, UA: 6

## 2015-06-23 MED ORDER — CIPROFLOXACIN HCL 500 MG PO TABS: 500.0000 mg | ORAL_TABLET | Freq: Two times a day (BID) | ORAL | Status: DC

## 2015-06-23 MED ORDER — PHENAZOPYRIDINE HCL 100 MG PO TABS
100.0000 mg | ORAL_TABLET | Freq: Three times a day (TID) | ORAL | Status: DC | PRN
Start: 2015-06-23 — End: 2015-11-09

## 2015-06-23 MED FILL — TAMOXIFEN 20 MG TABLET: 20 | 30 days supply | Qty: 30 | Fill #4

## 2015-06-23 MED FILL — CIPROFLOXACIN HCL 500 MG TA: 500 | 7 days supply | Qty: 14 | Fill #0

## 2015-06-23 NOTE — Progress Notes (Signed)
Subjective:    Patient ID: Abigail [name], female    DOB: [birth year], 50 y.o.   MRN: [id number]  HPI   Pt in with some blood urine this am. She could see it in toilet.   Pt in today reporting urinary symptoms.  Dysuria- yes(mild) Frequent urination-mild Hesitancy-no Suprapubic pressure-faint pressure Fever-no chills-no Nausea-no Vomiting-no CVA pain-no History of UTI-maybe one every 3 years Gross hematuria- yes.(and in past would get blood in urine before uti. But no blood in urine otherwise.)   Review of Systems  Constitutional: Negative for fever, chills and fatigue.  Respiratory: Negative for cough, chest tightness, shortness of breath and wheezing.   Cardiovascular: Negative for chest pain and palpitations.  Gastrointestinal: Negative for abdominal pain and abdominal distention.       Suprapubic pressure  Genitourinary: Positive for dysuria and hematuria. Negative for urgency, frequency, flank pain, decreased urine volume, enuresis, difficulty urinating and vaginal pain.  Musculoskeletal: Negative for back pain.  Hematological: Negative for adenopathy. Does not bruise/bleed easily.     Past Medical History  Diagnosis Date  . Chicken pox as a child  . Allergy     seasonal- spring  . Urticaria   . History of streptococcal pharyngitis   . History of mononucleosis   . Anemia     mild  . Allergic state 12/04/2010  . Preventative health care 12/04/2010  . Anxiety 02/11/2011  . Insomnia 02/11/2011  . Cancer ([place]) 02/21/11    Lt breast CA,BXINVASIVE DUCTAL CA GRADE 2,ER/PR=POSITIVE  . Breast cancer ([place]) 06/05/11    RIGHT  BX,POSTERIOR ,SLIGHT LATERAL=FOCAL ATYPIA LOBULAR HYPERPLASIA IN BACKGROUND OF EXTENSIVE STROMAL FIBROSIS  . S/P chemotherapy, time since less than 4 weeks 05/23/11    completed 4 doses docetaxel/cyclophosphamide  . Hot flashes   . Vaginitis 10/08/2011  . UTI (lower urinary tract infection) 11/28/2011     Social History   Social History  .  Marital Status: Married    Spouse Name: N/A  . Number of Children: 2  . Years of Education: N/A   Occupational History  . [employer]   Social History Main Topics  . Smoking status: Never Smoker   . Smokeless tobacco: Never Used  . Alcohol Use: 0.6 oz/week    1 Glasses of wine per week     Comment: occasionally- social  . Drug Use: No  . Sexual Activity: Yes    Birth Control/ Protection: Surgical   Other Topics Concern  . Not on file   Social History Narrative    Past Surgical History  Procedure Laterality Date  . Mouth surgery  1997    gum surgery  . Cesarean section  1999    X 1  . Varicose vein surgery      [facility]  . Portacath placement  03/20/2011    Procedure: INSERTION PORT-A-CATH;  Surgeon: Merrie Roof, MD;  Location: [facility];  Service: General;  Laterality: Right;  placement of port -Right Subclavian Vein  . Tubal ligation      1999  . Port-a-cath removal  08/07/2011    Procedure: REMOVAL PORT-A-CATH;  Surgeon: Merrie Roof, MD;  Location: [facility];  Service: General;  Laterality: Right;  . Mastectomy w/ sentinel node biopsy  08/07/2011    Procedure: MASTECTOMY WITH SENTINEL LYMPH NODE BIOPSY;  Surgeon: Merrie Roof, MD;  Location: [place] [place];  Service: General;  Laterality: Bilateral;  bilateral mastectomy  .  Tissue expander placement  08/07/2011    Procedure: TISSUE EXPANDER;  Surgeon: Crissie Reese, MD;  Location: [facility];  Service: Plastics;  Laterality: Bilateral;  BILATERAL PLACEMENT OF TISSUE EXPANDERS FOR BREAST RECONSTRUCTION  . Breast reconstruction Bilateral 03/24/2012    Procedure: BREAST RECONSTRUCTION WITH PLACEMENT OF SILICONE GEL IMPLANTS;  Surgeon: Crissie Reese, MD;  Location: [facility];  Service: Plastics;  Laterality: Bilateral;    Family History  Problem Relation Age of Onset  . Adopted: Yes    Allergies  Allergen Reactions  . Penicillins Rash    Current Outpatient Prescriptions on File  Prior to Visit  Medication Sig Dispense Refill  . albuterol (PROVENTIL HFA;VENTOLIN HFA) 108 (90 Base) MCG/ACT inhaler Inhale 2 puffs into the lungs every 6 (six) hours as needed for wheezing or shortness of breath. 1 Inhaler 0  . beclomethasone (QVAR) 40 MCG/ACT inhaler Inhale 2 puffs into the lungs 2 (two) times daily. 1 Inhaler 2  . tamoxifen (NOLVADEX) 20 MG tablet Take 1 tablet (20 mg total) by mouth daily. 90 tablet PRN  . zolpidem (AMBIEN) 10 MG tablet Take 0.5 tablets (5 mg total) by mouth at bedtime as needed for sleep. For sleep, pt takes 1/3 of tablet (Patient not taking: Reported on 06/23/2015) 30 tablet 0   No current facility-administered medications on file prior to visit.    BP 114/72 mmHg  Pulse 78  Temp(Src) 98.1 F (36.7 C) (Oral)  Ht 5\' 6"  (1.676 m)  Wt 138 lb (62.596 kg)  BMI 22.28 kg/m2  SpO2 98%        Objective:   Physical Exam   General  Mental Status- Alert. Orientation- Orientation x 4.   Skin General:- Normal. Moisture- Dry. Temperature- Warm.  HEENT Head- normal.  Neck Neck- Supple.  Heart Ausculation-RRR  Lungs Ausculation- Clear, even, unlabored bilaterlly.   Abdomen Palpation/Percussion: Palpation and Percussion of the abdomen reveal- faint suprapubicTender, No Rebound tenderness, No Rigidity(guarding), No Palpable abdominal masses and No jar tenderness. No suprapubic tenderness. Liver:-Normal. Spleen:- Normal. Other Characteristics- No Costovertebral angle tenderness- Left or Costovertebral angle tenderness- Right.  Auscultation: Auscultation of the abdomen reveals- Bowel Sounds normal.     Assessment & Plan:  You appear to have a urinary tract infection. I am prescribing cipro antibiotic for the probable infection. Hydrate well. I am sending out a urine culture. During the interim if your signs and symptoms worsen rather than improving please notify us. We will notify your when the culture results are back.  Will rx pyridium for  pain.  Follow up in 7 days or as needed.  Did recommend on her physical exam each year to get urine dip to check for blood.  Gerrie Castiglia, Percell Miller, PA-C

## 2015-06-23 NOTE — Patient Instructions (Addendum)
You appear to have a urinary tract infection. I am prescribing cipro antibiotic for the probable infection. Hydrate well. I am sending out a urine culture. During the interim if your signs and symptoms worsen rather than improving please notify us. We will notify your when the culture results are back.  Will rx pyridium for pain.   Follow up in 7 days or as needed.

## 2015-06-23 NOTE — Progress Notes (Signed)
Pre visit review using our clinic review tool, if applicable. No additional management support is needed unless otherwise documented below in the visit note. 

## 2015-06-26 LAB — URINE CULTURE

## 2015-06-27 NOTE — Progress Notes (Signed)
Quick Note:  Pt has seen results on MyChart and message also sent for patient to call back if any questions. ______ 

## 2015-07-31 MED FILL — ZOLPIDEM TARTRATE 10 MG TAB: 10 | 30 days supply | Qty: 30 | Fill #0

## 2015-07-31 MED FILL — TAMOXIFEN 20 MG TABLET: 20 | 30 days supply | Qty: 30 | Fill #5

## 2015-08-11 ENCOUNTER — Encounter: Payer: Self-pay | Admitting: Genetic Counselor

## 2015-08-18 ENCOUNTER — Telehealth: Payer: Self-pay | Admitting: Genetic Counselor

## 2015-08-18 ENCOUNTER — Encounter: Payer: Self-pay | Admitting: Genetic Counselor

## 2015-08-18 MED FILL — MEDROXYPROGESTERONE 10 MG T: 10 | 10 days supply | Qty: 10 | Fill #0

## 2015-08-18 NOTE — Telephone Encounter (Signed)
Pt confirmed appt, completed in take, mailed new pt letter

## 2015-08-31 MED FILL — TAMOXIFEN 20 MG TABLET: 20 | 30 days supply | Qty: 30 | Fill #6

## 2015-09-19 ENCOUNTER — Telehealth: Payer: Self-pay | Admitting: Genetic Counselor

## 2015-09-19 ENCOUNTER — Encounter: Payer: Self-pay | Admitting: Genetic Counselor

## 2015-09-19 NOTE — Telephone Encounter (Signed)
Discussed that patient had genetic testing in 2013.  That year was the first year we offered expanded testing and many individuals did not pursue it.  Patient was accidentally sent a letter, as it was not caught that she had pursued testing.  Patient indicated that there was an abnormal uterine finding and did she need further testing based on this.  Patient had cancernext testing in 2013 that tested for 22 genes outside of BRCA1/2 testing.  She was tested for Lynch syndrome, PTEN and other genes associated with uterine cancer.  Based on this additional history, I do not feel that additional testing from what she already had performed is necessary.  We will cancel her appointment for tomorrow. 

## 2015-09-20 ENCOUNTER — Other Ambulatory Visit: Payer: Managed Care, Other (non HMO)

## 2015-09-20 ENCOUNTER — Encounter: Payer: Managed Care, Other (non HMO) | Admitting: Genetic Counselor

## 2015-10-04 MED FILL — TAMOXIFEN 20 MG TABLET: 20 | 30 days supply | Qty: 30 | Fill #7

## 2015-11-02 MED FILL — TAMOXIFEN 20 MG TABLET: 20 | 30 days supply | Qty: 30 | Fill #8

## 2015-11-02 MED FILL — ZOLPIDEM TARTRATE 10 MG TAB: 10 | 30 days supply | Qty: 30 | Fill #1

## 2015-11-08 ENCOUNTER — Other Ambulatory Visit: Payer: Self-pay

## 2015-11-08 DIAGNOSIS — C50212 Malignant neoplasm of upper-inner quadrant of left female breast: Secondary | ICD-10-CM

## 2015-11-09 ENCOUNTER — Ambulatory Visit (HOSPITAL_BASED_OUTPATIENT_CLINIC_OR_DEPARTMENT_OTHER): Payer: Managed Care, Other (non HMO) | Admitting: Oncology

## 2015-11-09 ENCOUNTER — Other Ambulatory Visit (HOSPITAL_BASED_OUTPATIENT_CLINIC_OR_DEPARTMENT_OTHER): Payer: Managed Care, Other (non HMO)

## 2015-11-09 VITALS — BP 99/70 | HR 56 | Temp 98.0°F | Resp 18 | Ht 66.0 in | Wt 137.0 lb

## 2015-11-09 DIAGNOSIS — C50212 Malignant neoplasm of upper-inner quadrant of left female breast: Secondary | ICD-10-CM

## 2015-11-09 DIAGNOSIS — N840 Polyp of corpus uteri: Secondary | ICD-10-CM

## 2015-11-09 DIAGNOSIS — Z170 Estrogen receptor positive status [ER+]: Secondary | ICD-10-CM | POA: Diagnosis not present

## 2015-11-09 DIAGNOSIS — Z79810 Long term (current) use of selective estrogen receptor modulators (SERMs): Secondary | ICD-10-CM

## 2015-11-09 LAB — CBC WITH DIFFERENTIAL/PLATELET
BASO%: 0.6 % (ref 0.0–2.0)
BASOS ABS: 0 10*3/uL (ref 0.0–0.1)
EOS ABS: 0.2 10*3/uL (ref 0.0–0.5)
EOS%: 3.5 % (ref 0.0–7.0)
HEMATOCRIT: 37.6 % (ref 34.8–46.6)
HEMOGLOBIN: 12.3 g/dL (ref 11.6–15.9)
LYMPH#: 1.7 10*3/uL (ref 0.9–3.3)
LYMPH%: 32.6 % (ref 14.0–49.7)
MCH: 31.1 pg (ref 25.1–34.0)
MCHC: 32.7 g/dL (ref 31.5–36.0)
MCV: 94.9 fL (ref 79.5–101.0)
MONO#: 0.4 10*3/uL (ref 0.1–0.9)
MONO%: 8 % (ref 0.0–14.0)
NEUT%: 55.3 % (ref 38.4–76.8)
NEUTROS ABS: 2.8 10*3/uL (ref 1.5–6.5)
PLATELETS: 187 10*3/uL (ref 145–400)
RBC: 3.96 10*6/uL (ref 3.70–5.45)
RDW: 12.1 % (ref 11.2–14.5)
WBC: 5.1 10*3/uL (ref 3.9–10.3)

## 2015-11-09 LAB — COMPREHENSIVE METABOLIC PANEL
ALBUMIN: 3.8 g/dL (ref 3.5–5.0)
ALK PHOS: 51 U/L (ref 40–150)
ALT: 15 U/L (ref 0–55)
AST: 19 U/L (ref 5–34)
Anion Gap: 8 mEq/L (ref 3–11)
BILIRUBIN TOTAL: 0.33 mg/dL (ref 0.20–1.20)
BUN: 21.7 mg/dL (ref 7.0–26.0)
CALCIUM: 9.3 mg/dL (ref 8.4–10.4)
CO2: 28 mEq/L (ref 22–29)
Chloride: 106 mEq/L (ref 98–109)
Creatinine: 0.8 mg/dL (ref 0.6–1.1)
EGFR: 85 mL/min/{1.73_m2} — AB (ref >90)
GLUCOSE: 91 mg/dL (ref 70–140)
POTASSIUM: 4.2 meq/L (ref 3.5–5.1)
Sodium: 142 mEq/L (ref 136–145)
TOTAL PROTEIN: 6.7 g/dL (ref 6.4–8.3)

## 2015-11-09 MED ORDER — ANASTROZOLE 1 MG PO TABS: 1.0000 mg | ORAL_TABLET | Freq: Every day | ORAL | 4 refills | Status: DC

## 2015-11-09 NOTE — Progress Notes (Signed)
ID: Abigail [name]   DOB: 1965/03/10  MR#: [id number]  QTM#:[id number]  PCP: Penni Homans, MD SU: Autumn Messing MD GYN: Louretta Shorten OTHER: Eppie Gibson, Crissie Reese  HISTORY OF PRESENT ILLNESS: From the original intake note:  The patient is a 50 year old [place] woman who had screening mammography at [facility] 02/01/2011, showing calcifications in the left breast. She returned for diagnostic left mammography 02/21/2011, and this showed a 1.1 cm cluster of pleomorphic microcalcifications in the upper inner aspect of the left breast. Biopsy of this area was performed the same day, and showed ([id number]) An invasive ductal carcinoma, grade 2, which was estrogen receptor positive at 89%, progesterone receptor positive at 99%, with an MIB-1 of 30% and no HER-2 amplification. The patient was then set up for bilateral breast MRIs, performed 02/26/2011. There were multiple small cysts in both breasts. In the upper inner quadrant of the left breast there was a 2.7 cm enhancing mass with ill-defined margins. This contained a biopsy clip. There was a hematoma superior to this mass, measuring 2.2 cm. There were no other suspicious areas in either breast and no abnormal appearing lymph nodes.  With this information the patient's case was presented at the multidisciplinary breast cancer conference 02/27/2011, and she was seen in the [facility] clinic the same day. A decision was made to proceed with neoadjuvant treatment.   Her subsequent history is as detailed below.  INTERVAL HISTORY: [name] returns today  followup of her estrogen receptor positive breast cancer. She continues on tamoxifen, generally with good tolerance. The one problem she is having is endometrial thickening and endometrial polyps developing. She recently went through a D&C procedure for this. This is her second. Aside from that she tolerates it well. Recall she is status post bilateral salpingo-oophorectomy  REVIEW OF SYSTEMS: [name]  continues to work full time. She exercises regularly and is planning to do the 5K this weekend. Sometimes after she exercises vigorously she has some discomfort in her left axilla. This only occurs with significant exercise. A detailed review of systems today was otherwise negative.  PAST MEDICAL HISTORY: Past Medical History:  Diagnosis Date  . Allergic state 12/04/2010  . Allergy    seasonal- spring  . Anemia    mild  . Anxiety 02/11/2011  . Breast cancer ([place]) 06/05/11   RIGHT  BX,POSTERIOR ,SLIGHT LATERAL=FOCAL ATYPIA LOBULAR HYPERPLASIA IN BACKGROUND OF EXTENSIVE STROMAL FIBROSIS  . Cancer ([place]) 02/21/11   Lt breast CA,BXINVASIVE DUCTAL CA GRADE 2,ER/PR=POSITIVE  . Chicken pox as a child  . History of mononucleosis   . History of streptococcal pharyngitis   . Hot flashes   . Insomnia 02/11/2011  . Preventative health care 12/04/2010  . S/P chemotherapy, time since less than 4 weeks 05/23/11   completed 4 doses docetaxel/cyclophosphamide  . Urticaria   . UTI (lower urinary tract infection) 11/28/2011  . Vaginitis 10/08/2011    PAST SURGICAL HISTORY: Past Surgical History:  Procedure Laterality Date  . BREAST RECONSTRUCTION Bilateral 03/24/2012   Procedure: BREAST RECONSTRUCTION WITH PLACEMENT OF SILICONE GEL IMPLANTS;  Surgeon: Crissie Reese, MD;  Location: [place];  Service: Plastics;  Laterality: Bilateral;  . [place]   X 1  . MASTECTOMY W/ SENTINEL NODE BIOPSY  08/07/2011   Procedure: MASTECTOMY WITH SENTINEL LYMPH NODE BIOPSY;  Surgeon: Merrie Roof, MD;  Location: [place];  Service: General;  Laterality: Bilateral;  bilateral mastectomy  . MOUTH SURGERY  1997   gum surgery  .  PORT-A-CATH REMOVAL  08/07/2011   Procedure: REMOVAL PORT-A-CATH;  Surgeon: Merrie Roof, MD;  Location: [place];  Service: General;  Laterality: Right;  . PORTACATH PLACEMENT  03/20/2011   Procedure: INSERTION PORT-A-CATH;  Surgeon: Merrie Roof, MD;  Location: [place];   Service: General;  Laterality: Right;  placement of port -Right Subclavian Vein  . TISSUE EXPANDER PLACEMENT  08/07/2011   Procedure: TISSUE EXPANDER;  Surgeon: Crissie Reese, MD;  Location: [place];  Service: Plastics;  Laterality: Bilateral;  BILATERAL PLACEMENT OF TISSUE EXPANDERS FOR BREAST RECONSTRUCTION  . TUBAL LIGATION     1999  . VARICOSE VEIN SURGERY     [place] [facility]    FAMILY HISTORY Family History  Problem Relation Age of Onset  . Adopted: Yes    Gynecologic history: She had menarche age 68, was still having regular periods at the time of her diagnosis, but periods stopped with her chemotherapy. She is s/p BTL.She used birth control pills for approximately 10 years, with no clotting or other complications. She is GX P2. Age at first live birth was 89. 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 Her husband Annie Main is in accounting. Son Ladonna Snide (pronounced with a hard "g") is in college studying are not a Recruitment consultant, daughter Raquel Sarna is high school junior  ADVANCED DIRECTIVES: Not in place  HEALTH MAINTENANCE: Social History  Substance Use Topics  . Smoking status: Never Smoker  . Smokeless tobacco: Never Used  . Alcohol use 0.6 oz/week    1 Glasses of wine per week     Comment: occasionally- social     Colonoscopy:  PAP:  Bone density:  Lipid panel:  Allergies  Allergen Reactions  . Penicillins Rash    Current Outpatient Prescriptions  Medication Sig Dispense Refill  . albuterol (PROVENTIL HFA;VENTOLIN HFA) 108 (90 Base) MCG/ACT inhaler Inhale 2 puffs into the lungs every 6 (six) hours as needed for wheezing or shortness of breath. 1 Inhaler 0  . beclomethasone (QVAR) 40 MCG/ACT inhaler Inhale 2 puffs into the lungs 2 (two) times daily. 1 Inhaler 2  . tamoxifen (NOLVADEX) 20 MG tablet Take 1 tablet (20  mg total) by mouth daily. 90 tablet PRN  . zolpidem (AMBIEN) 10 MG tablet Take 0.5 tablets (5 mg total) by mouth at bedtime as needed for sleep. For sleep, pt takes 1/3 of tablet (Patient not taking: Reported on 06/23/2015) 30 tablet 0   No current facility-administered medications for this visit.     OBJECTIVE: Middle-aged white woman In no acute distress  Vitals:   11/09/15 1331  BP: 99/70  Pulse: (!) 56  Resp: 18  Temp: 98 F (36.7 C)     Body mass index is 22.11 kg/m.    ECOG FS: 1  Sclerae unicteric, pupils round and equal Oropharynx clear and moist-- no thrush or other lesions No cervical or supraclavicular adenopathy Lungs no rales or rhonchi Heart regular rate and rhythm Abd soft, nontender, positive bowel sounds MSK no focal spinal tenderness, no upper extremity lymphedema Neuro: nonfocal, well oriented, appropriate affect Breasts: Status post bilateral mastectomies with implant reconstruction. There is no evidence of local recurrence. Both axillae are benign. Particularly the left axilla is entirely unremarkable    LAB RESULTS: Lab Results  Component Value Date   WBC 5.1 11/09/2015   NEUTROABS 2.8 11/09/2015  HGB 12.3 11/09/2015   HCT 37.6 11/09/2015   MCV 94.9 11/09/2015   PLT 187 11/09/2015      Chemistry      Component Value Date/Time   NA 142 11/09/2015 1314   K 4.2 11/09/2015 1314   CL 101 02/13/2015 1454   CL 104 05/07/2012 1138   CO2 28 11/09/2015 1314   BUN 21.7 11/09/2015 1314   CREATININE 0.8 11/09/2015 1314      Component Value Date/Time   CALCIUM 9.3 11/09/2015 1314   ALKPHOS 51 11/09/2015 1314   AST 19 11/09/2015 1314   ALT 15 11/09/2015 1314   BILITOT 0.33 11/09/2015 1314       Lab Results  Component Value Date   LABCA2 23 02/27/2011     STUDIES: No results found.  ASSESSMENT: 50 y.o.  [place] woman with a BRCA mutation of uncertain significance  (1) status post left breastUpper inner quadrant biopsy 01/24/ 2013, for  a clinical T2 N0, stage IIA invasive ductal carcinoma, grade 2, strongly estrogen and progesterone receptor positive, HER-2 not amplified, with an MIB-1 of 30%.   (2) status post 4 doses of docetaxel/ cyclophosphamide, completed 05/23/2011  (3) on tamoxifen since May 2013  (4) s/p bilateral mastectomies 08/07/2011 showing  (a) on the Right, atypical lobular hyperplasia, no malignancy  (b) on the left, a residual pT1c pN0 invasive ductal carcinoma, estrogen receptor 97% and progesterone receptor 85% positive, HER-2 not amplified.  (5) completed implant reconstruction February 2014  (6) s/p bilateral salpingo-oophorectomy November 2014 with benign pathology   PLAN:  Elaysha is now a little over 4 years out from definitive surgery for her breast cancer. This is very favorable.  She tolerates tamoxifen well except for the problems with endometrial polyps and endometrial lining thickening. She is quite aware of the risk of endometrial cancer developing in his back is can it have a D&C 11/23/2015 under Dr Corinna Capra  Obviously if there is any evidence of malignancy she will proceed to hysterectomy. Otherwise however she will keep her uterus.  Regardless of what happens with the uterus, the plan will be to stop tamoxifen and switch her over to anastrozole.  Today we discussed the possible toxicities, side effects and complications of anastrozole. She will stop tamoxifen at the end of this month and start anastrozole 01/29/2015.  She takes anastrozole for 2 years, she will obtain the same benefit as if she took tamoxifen an additional 6 years. She is interested in this all she is of course concerned about the possible side effects.  I'm going to see her in March just to make sure that she tolerates this well.  She knows to call for any problems that may develop before her next visit here.Marland Kitchen   MAGRINAT,GUSTAV C    11/09/2015

## 2015-11-21 ENCOUNTER — Ambulatory Visit (INDEPENDENT_AMBULATORY_CARE_PROVIDER_SITE_OTHER): Payer: Managed Care, Other (non HMO) | Admitting: Family Medicine

## 2015-11-21 ENCOUNTER — Encounter: Payer: Self-pay | Admitting: Family Medicine

## 2015-11-21 ENCOUNTER — Encounter: Payer: Self-pay | Admitting: Internal Medicine

## 2015-11-21 VITALS — BP 100/62

## 2015-11-21 DIAGNOSIS — T7840XD Allergy, unspecified, subsequent encounter: Secondary | ICD-10-CM | POA: Diagnosis not present

## 2015-11-21 DIAGNOSIS — Z1501 Genetic susceptibility to malignant neoplasm of breast: Secondary | ICD-10-CM | POA: Diagnosis not present

## 2015-11-21 DIAGNOSIS — Z0000 Encounter for general adult medical examination without abnormal findings: Secondary | ICD-10-CM

## 2015-11-21 DIAGNOSIS — Z1502 Genetic susceptibility to malignant neoplasm of ovary: Secondary | ICD-10-CM

## 2015-11-21 DIAGNOSIS — Z1509 Genetic susceptibility to other malignant neoplasm: Secondary | ICD-10-CM | POA: Insufficient documentation

## 2015-11-21 DIAGNOSIS — M8580 Other specified disorders of bone density and structure, unspecified site: Secondary | ICD-10-CM

## 2015-11-21 DIAGNOSIS — Z170 Estrogen receptor positive status [ER+]: Secondary | ICD-10-CM

## 2015-11-21 DIAGNOSIS — Z1211 Encounter for screening for malignant neoplasm of colon: Secondary | ICD-10-CM | POA: Diagnosis not present

## 2015-11-21 DIAGNOSIS — C50212 Malignant neoplasm of upper-inner quadrant of left female breast: Secondary | ICD-10-CM

## 2015-11-21 HISTORY — DX: Genetic susceptibility to malignant neoplasm of breast: Z15.01

## 2015-11-21 HISTORY — DX: Genetic susceptibility to malignant neoplasm of breast: Z15.09

## 2015-11-21 MED ORDER — MONTELUKAST SODIUM 10 MG PO TABS: 10.0000 mg | ORAL_TABLET | Freq: Every evening | ORAL | 3 refills | Status: DC | PRN

## 2015-11-21 MED FILL — ANASTROZOLE 1 MG TABLET: 1 | 90 days supply | Qty: 90 | Fill #0

## 2015-11-21 MED FILL — MONTELUKAST SOD 10 MG TAB: 10 | 30 days supply | Qty: 30 | Fill #0

## 2015-11-21 NOTE — Progress Notes (Signed)
Patient ID: Abigail [name], female   DOB: 11-Aug-1965, 50 y.o.   MRN: [id number]   Subjective:    Patient ID: Abigail [name], female    DOB: 12/17/1965, 50 y.o.   MRN: [id number]  Chief Complaint  Patient presents with  . Annual Exam    HPI Patient is in today for an annual exam, she presents with mild seasonal allergies, has been using Loratadine with minimal results. She is scheduled for a D and C this week due to thickened endometrial lining with Dr Erling Cruz. They have stopped Tamoxifen as a result and started ARmidex which she is tolerating. Denies CP/palp/SOB/HA/congestion/fevers/GI or GU c/o. Taking meds as prescribed  Past Medical History:  Diagnosis Date  . Allergic state 12/04/2010  . Allergy    seasonal- spring  . Anemia    mild  . Anxiety 02/11/2011  . BRCA gene mutation positive 11/21/2015  . Breast cancer ([place]) 06/05/11   RIGHT  BX,POSTERIOR ,SLIGHT LATERAL=FOCAL ATYPIA LOBULAR HYPERPLASIA IN BACKGROUND OF EXTENSIVE STROMAL FIBROSIS  . Cancer ([place]) 02/21/11   Lt breast CA,BXINVASIVE DUCTAL CA GRADE 2,ER/PR=POSITIVE  . Chicken pox as a child  . History of mononucleosis   . History of streptococcal pharyngitis   . Hot flashes   . Insomnia 02/11/2011  . Preventative health care 12/04/2010  . S/P chemotherapy, time since less than 4 weeks 05/23/11   completed 4 doses docetaxel/cyclophosphamide  . Urticaria   . UTI (lower urinary tract infection) 11/28/2011  . Vaginitis 10/08/2011    Past Surgical History:  Procedure Laterality Date  . BREAST RECONSTRUCTION Bilateral 03/24/2012   Procedure: BREAST RECONSTRUCTION WITH PLACEMENT OF SILICONE GEL IMPLANTS;  Surgeon: Crissie Reese, MD;  Location: [place];  Service: Plastics;  Laterality: Bilateral;  . [place]   X 1  . MASTECTOMY W/ SENTINEL NODE BIOPSY  08/07/2011   Procedure: MASTECTOMY WITH SENTINEL LYMPH NODE BIOPSY;  Surgeon: Merrie Roof, MD;  Location: [place];  Service: General;  Laterality: Bilateral;   bilateral mastectomy  . MOUTH SURGERY  1997   gum surgery  . PORT-A-CATH REMOVAL  08/07/2011   Procedure: REMOVAL PORT-A-CATH;  Surgeon: Merrie Roof, MD;  Location: [place];  Service: General;  Laterality: Right;  . PORTACATH PLACEMENT  03/20/2011   Procedure: INSERTION PORT-A-CATH;  Surgeon: Merrie Roof, MD;  Location: [place];  Service: General;  Laterality: Right;  placement of port -Right Subclavian Vein  . TISSUE EXPANDER PLACEMENT  08/07/2011   Procedure: TISSUE EXPANDER;  Surgeon: Crissie Reese, MD;  Location: [place];  Service: Plastics;  Laterality: Bilateral;  BILATERAL PLACEMENT OF TISSUE EXPANDERS FOR BREAST RECONSTRUCTION  . TUBAL LIGATION     1999  . VARICOSE VEIN SURGERY     [place] [facility]    Family History  Problem Relation Age of Onset  . Adopted: Yes  . Heart disease Maternal Uncle   . Other Maternal Grandmother     mad cow disease    Social History   Social History  . Marital status: Married    Spouse name: N/A  . Number of children: 2  . Years of education: N/A   Occupational History  . [employer]   Social History Main Topics  . Smoking status: Never Smoker  . Smokeless tobacco: Never Used  . Alcohol use 0.6 oz/week    1 Glasses of wine per week     Comment: occasionally- social  .  Drug use: No  . Sexual activity: Yes    Birth control/ protection: Surgical   Other Topics Concern  . Not on file   Social History Narrative  . No narrative on file    Outpatient Medications Prior to Visit  Medication Sig Dispense Refill  . zolpidem (AMBIEN) 10 MG tablet Take 0.5 tablets (5 mg total) by mouth at bedtime as needed for sleep. For sleep, pt takes 1/3 of tablet 30 tablet 0  . anastrozole (ARIMIDEX) 1 MG tablet Take 1 tablet (1 mg total) by mouth daily. (Patient not taking: Reported on 11/21/2015) 90 tablet 4   No facility-administered medications prior to visit.     Allergies  Allergen Reactions  .  Penicillins Rash    Review of Systems  Constitutional: Negative for fever.  HENT: Positive for congestion.   Eyes: Negative for blurred vision.  Respiratory: Negative for cough and shortness of breath.   Cardiovascular: Negative for chest pain and palpitations.  Gastrointestinal: Negative for vomiting.  Musculoskeletal: Negative for back pain.  Skin: Negative for rash.  Neurological: Negative for loss of consciousness and headaches.       Objective:    Physical Exam  Constitutional: She is oriented to person, place, and time. She appears well-developed and well-nourished. No distress.  HENT:  Head: Normocephalic and atraumatic.  Eyes: Conjunctivae are normal.  Neck: Normal range of motion. No thyromegaly present.  Cardiovascular: Normal rate and regular rhythm.   Pulmonary/Chest: Effort normal and breath sounds normal. She has no wheezes.  Abdominal: Soft. Bowel sounds are normal. There is no tenderness.  Musculoskeletal: Normal range of motion. She exhibits no edema or deformity.  Neurological: She is alert and oriented to person, place, and time.  Skin: Skin is warm and dry. She is not diaphoretic.  Psychiatric: She has a normal mood and affect.    BP 100/62 (BP Location: Left Arm)  Wt Readings from Last 3 Encounters:  11/09/15 137 lb (62.1 kg)  06/23/15 138 lb (62.6 kg)  02/13/15 137 lb 6.4 oz (62.3 kg)     Lab Results  Component Value Date   WBC 5.1 11/09/2015   HGB 12.3 11/09/2015   HCT 37.6 11/09/2015   PLT 187 11/09/2015   GLUCOSE 91 11/09/2015   CHOL 152 11/21/2015   TRIG 134.0 11/21/2015   HDL 73.30 11/21/2015   LDLCALC 51 11/21/2015   ALT 15 11/09/2015   AST 19 11/09/2015   NA 142 11/09/2015   K 4.2 11/09/2015   CL 101 02/13/2015   CREATININE 0.8 11/09/2015   BUN 21.7 11/09/2015   CO2 28 11/09/2015   TSH 1.10 11/21/2015    Lab Results  Component Value Date   TSH 1.10 11/21/2015   Lab Results  Component Value Date   WBC 5.1 11/09/2015    HGB 12.3 11/09/2015   HCT 37.6 11/09/2015   MCV 94.9 11/09/2015   PLT 187 11/09/2015   Lab Results  Component Value Date   NA 142 11/09/2015   K 4.2 11/09/2015   CHLORIDE 106 11/09/2015   CO2 28 11/09/2015   GLUCOSE 91 11/09/2015   BUN 21.7 11/09/2015   CREATININE 0.8 11/09/2015   BILITOT 0.33 11/09/2015   ALKPHOS 51 11/09/2015   AST 19 11/09/2015   ALT 15 11/09/2015   PROT 6.7 11/09/2015   ALBUMIN 3.8 11/09/2015   CALCIUM 9.3 11/09/2015   ANIONGAP 8 11/09/2015   EGFR 85 (L) 11/09/2015   GFR 84.61 10/03/2011   Lab Results  Component Value Date   CHOL 152 11/21/2015   Lab Results  Component Value Date   HDL 73.30 11/21/2015   Lab Results  Component Value Date   LDLCALC 51 11/21/2015   Lab Results  Component Value Date   TRIG 134.0 11/21/2015   Lab Results  Component Value Date   CHOLHDL 2 11/21/2015   No results found for: HGBA1C     Assessment & Plan:   Problem List Items Addressed This Visit    Allergic state    Continue antihistamines and add Flonase and Singulair.       Relevant Medications   montelukast (SINGULAIR) 10 MG tablet   Preventative health care - Primary    Patient encouraged to maintain heart healthy diet, regular exercise, adequate sleep. Consider daily probiotics. . Given and reviewed copy of ACP documents from [place] and encouraged to complete and returnprescribed. Colonoscopy is ordered for screening purposes.       Relevant Orders   TSH (Completed)   Lipid Profile (Completed)   HIV antibody (with reflex) (Completed)   Breast cancer of upper-inner quadrant of left female breast ([place])    Tolerating Arimedex.      Osteopenia    Encouraged to get adequate exercise, calcium and vitamin d intake      BRCA gene mutation positive   Colon cancer screening   Relevant Orders   Ambulatory referral to Gastroenterology    Other Visit Diagnoses   None.     I am having Ms. Jacobowitz start on montelukast. I am also  having her maintain her zolpidem, anastrozole, calcium citrate, ferrous sulfate, and loratadine.  Meds ordered this encounter  Medications  . calcium citrate (CALCITRATE - DOSED IN MG ELEMENTAL CALCIUM) 950 MG tablet    Sig: Take 200 mg of elemental calcium by mouth daily.  . ferrous sulfate 325 (65 FE) MG EC tablet    Sig: Take 325 mg by mouth 3 (three) times daily with meals.  Marland Kitchen loratadine (CLARITIN) 10 MG tablet    Sig: Take 10 mg by mouth daily.  . montelukast (SINGULAIR) 10 MG tablet    Sig: Take 1 tablet (10 mg total) by mouth at bedtime as needed.    Dispense:  30 tablet    Refill:  3     Penni Homans, MD

## 2015-11-21 NOTE — Patient Instructions (Signed)
Preventive Care for Adults, Female A healthy lifestyle and preventive care can promote health and wellness. Preventive health guidelines for women include the following key practices.  A routine yearly physical is a good way to check with your health care provider about your health and preventive screening. It is a chance to share any concerns and updates on your health and to receive a thorough exam.  Visit your dentist for a routine exam and preventive care every 6 months. Brush your teeth twice a day and floss once a day. Good oral hygiene prevents tooth decay and gum disease.  The frequency of eye exams is based on your age, health, family medical history, use of contact lenses, and other factors. Follow your health care provider's recommendations for frequency of eye exams.  Eat a healthy diet. Foods like vegetables, fruits, whole grains, low-fat dairy products, and lean protein foods contain the nutrients you need without too many calories. Decrease your intake of foods high in solid fats, added sugars, and salt. Eat the right amount of calories for you.Get information about a proper diet from your health care provider, if necessary.  Regular physical exercise is one of the most important things you can do for your health. Most adults should get at least 150 minutes of moderate-intensity exercise (any activity that increases your heart rate and causes you to sweat) each week. In addition, most adults need muscle-strengthening exercises on 2 or more days a week.  Maintain a healthy weight. The body mass index (BMI) is a screening tool to identify possible weight problems. It provides an estimate of body fat based on height and weight. Your health care provider can find your BMI and can help you achieve or maintain a healthy weight.For adults 20 years and older:  A BMI below 18.5 is considered underweight.  A BMI of 18.5 to 24.9 is normal.  A BMI of 25 to 29.9 is considered overweight.  A  BMI of 30 and above is considered obese.  Maintain normal blood lipids and cholesterol levels by exercising and minimizing your intake of saturated fat. Eat a balanced diet with plenty of fruit and vegetables. Blood tests for lipids and cholesterol should begin at age 45 and be repeated every 5 years. If your lipid or cholesterol levels are high, you are over 50, or you are at high risk for heart disease, you may need your cholesterol levels checked more frequently.Ongoing high lipid and cholesterol levels should be treated with medicines if diet and exercise are not working.  If you smoke, find out from your health care provider how to quit. If you do not use tobacco, do not start.  Lung cancer screening is recommended for adults aged 45-80 years who are at high risk for developing lung cancer because of a history of smoking. A yearly low-dose CT scan of the lungs is recommended for people who have at least a 30-pack-year history of smoking and are a current smoker or have quit within the past 15 years. A pack year of smoking is smoking an average of 1 pack of cigarettes a day for 1 year (for example: 1 pack a day for 30 years or 2 packs a day for 15 years). Yearly screening should continue until the smoker has stopped smoking for at least 15 years. Yearly screening should be stopped for people who develop a health problem that would prevent them from having lung cancer treatment.  If you are pregnant, do not drink alcohol. If you are  breastfeeding, be very cautious about drinking alcohol. If you are not pregnant and choose to drink alcohol, do not have more than 1 drink per day. One drink is considered to be 12 ounces (355 mL) of beer, 5 ounces (148 mL) of wine, or 1.5 ounces (44 mL) of liquor.  Avoid use of street drugs. Do not share needles with anyone. Ask for help if you need support or instructions about stopping the use of drugs.  High blood pressure causes heart disease and increases the risk  of stroke. Your blood pressure should be checked at least every 1 to 2 years. Ongoing high blood pressure should be treated with medicines if weight loss and exercise do not work.  If you are 55-79 years old, ask your health care provider if you should take aspirin to prevent strokes.  Diabetes screening is done by taking a blood sample to check your blood glucose level after you have not eaten for a certain period of time (fasting). If you are not overweight and you do not have risk factors for diabetes, you should be screened once every 3 years starting at age 45. If you are overweight or obese and you are 40-70 years of age, you should be screened for diabetes every year as part of your cardiovascular risk assessment.  Breast cancer screening is essential preventive care for women. You should practice "breast self-awareness." This means understanding the normal appearance and feel of your breasts and may include breast self-examination. Any changes detected, no matter how small, should be reported to a health care provider. Women in their 20s and 30s should have a clinical breast exam (CBE) by a health care provider as part of a regular health exam every 1 to 3 years. After age 40, women should have a CBE every year. Starting at age 40, women should consider having a mammogram (breast X-ray test) every year. Women who have a family history of breast cancer should talk to their health care provider about genetic screening. Women at a high risk of breast cancer should talk to their health care providers about having an MRI and a mammogram every year.  Breast cancer gene (BRCA)-related cancer risk assessment is recommended for women who have family members with BRCA-related cancers. BRCA-related cancers include breast, ovarian, tubal, and peritoneal cancers. Having family members with these cancers may be associated with an increased risk for harmful changes (mutations) in the breast cancer genes BRCA1 and  BRCA2. Results of the assessment will determine the need for genetic counseling and BRCA1 and BRCA2 testing.  Your health care provider may recommend that you be screened regularly for cancer of the pelvic organs (ovaries, uterus, and vagina). This screening involves a pelvic examination, including checking for microscopic changes to the surface of your cervix (Pap test). You may be encouraged to have this screening done every 3 years, beginning at age 21.  For women ages 30-65, health care providers may recommend pelvic exams and Pap testing every 3 years, or they may recommend the Pap and pelvic exam, combined with testing for human papilloma virus (HPV), every 5 years. Some types of HPV increase your risk of cervical cancer. Testing for HPV may also be done on women of any age with unclear Pap test results.  Other health care providers may not recommend any screening for nonpregnant women who are considered low risk for pelvic cancer and who do not have symptoms. Ask your health care provider if a screening pelvic exam is right for   you.  If you have had past treatment for cervical cancer or a condition that could lead to cancer, you need Pap tests and screening for cancer for at least 20 years after your treatment. If Pap tests have been discontinued, your risk factors (such as having a new sexual partner) need to be reassessed to determine if screening should resume. Some women have medical problems that increase the chance of getting cervical cancer. In these cases, your health care provider may recommend more frequent screening and Pap tests.  Colorectal cancer can be detected and often prevented. Most routine colorectal cancer screening begins at the age of 50 years and continues through age 75 years. However, your health care provider may recommend screening at an earlier age if you have risk factors for colon cancer. On a yearly basis, your health care provider may provide home test kits to check  for hidden blood in the stool. Use of a small camera at the end of a tube, to directly examine the colon (sigmoidoscopy or colonoscopy), can detect the earliest forms of colorectal cancer. Talk to your health care provider about this at age 50, when routine screening begins. Direct exam of the colon should be repeated every 5-10 years through age 75 years, unless early forms of precancerous polyps or small growths are found.  People who are at an increased risk for hepatitis B should be screened for this virus. You are considered at high risk for hepatitis B if:  You were born in a country where hepatitis B occurs often. Talk with your health care provider about which countries are considered high risk.  Your parents were born in a high-risk country and you have not received a shot to protect against hepatitis B (hepatitis B vaccine).  You have HIV or AIDS.  You use needles to inject street drugs.  You live with, or have sex with, someone who has hepatitis B.  You get hemodialysis treatment.  You take certain medicines for conditions like cancer, organ transplantation, and autoimmune conditions.  Hepatitis C blood testing is recommended for all people born from 1945 through 1965 and any individual with known risks for hepatitis C.  Practice safe sex. Use condoms and avoid high-risk sexual practices to reduce the spread of sexually transmitted infections (STIs). STIs include gonorrhea, chlamydia, syphilis, trichomonas, herpes, HPV, and human immunodeficiency virus (HIV). Herpes, HIV, and HPV are viral illnesses that have no cure. They can result in disability, cancer, and death.  You should be screened for sexually transmitted illnesses (STIs) including gonorrhea and chlamydia if:  You are sexually active and are younger than 24 years.  You are older than 24 years and your health care provider tells you that you are at risk for this type of infection.  Your sexual activity has changed  since you were last screened and you are at an increased risk for chlamydia or gonorrhea. Ask your health care provider if you are at risk.  If you are at risk of being infected with HIV, it is recommended that you take a prescription medicine daily to prevent HIV infection. This is called preexposure prophylaxis (PrEP). You are considered at risk if:  You are sexually active and do not regularly use condoms or know the HIV status of your partner(s).  You take drugs by injection.  You are sexually active with a partner who has HIV.  Talk with your health care provider about whether you are at high risk of being infected with HIV. If   you choose to begin PrEP, you should first be tested for HIV. You should then be tested every 3 months for as long as you are taking PrEP.  Osteoporosis is a disease in which the bones lose minerals and strength with aging. This can result in serious bone fractures or breaks. The risk of osteoporosis can be identified using a bone density scan. Women ages 67 years and over and women at risk for fractures or osteoporosis should discuss screening with their health care providers. Ask your health care provider whether you should take a calcium supplement or vitamin D to reduce the rate of osteoporosis.  Menopause can be associated with physical symptoms and risks. Hormone replacement therapy is available to decrease symptoms and risks. You should talk to your health care provider about whether hormone replacement therapy is right for you.  Use sunscreen. Apply sunscreen liberally and repeatedly throughout the day. You should seek shade when your shadow is shorter than you. Protect yourself by wearing long sleeves, pants, a wide-brimmed hat, and sunglasses year round, whenever you are outdoors.  Once a month, do a whole body skin exam, using a mirror to look at the skin on your back. Tell your health care provider of new moles, moles that have irregular borders, moles that  are larger than a pencil eraser, or moles that have changed in shape or color.  Stay current with required vaccines (immunizations).  Influenza vaccine. All adults should be immunized every year.  Tetanus, diphtheria, and acellular pertussis (Td, Tdap) vaccine. Pregnant women should receive 1 dose of Tdap vaccine during each pregnancy. The dose should be obtained regardless of the length of time since the last dose. Immunization is preferred during the 27th-36th week of gestation. An adult who has not previously received Tdap or who does not know her vaccine status should receive 1 dose of Tdap. This initial dose should be followed by tetanus and diphtheria toxoids (Td) booster doses every 10 years. Adults with an unknown or incomplete history of completing a 3-dose immunization series with Td-containing vaccines should begin or complete a primary immunization series including a Tdap dose. Adults should receive a Td booster every 10 years.  Varicella vaccine. An adult without evidence of immunity to varicella should receive 2 doses or a second dose if she has previously received 1 dose. Pregnant females who do not have evidence of immunity should receive the first dose after pregnancy. This first dose should be obtained before leaving the health care facility. The second dose should be obtained 4-8 weeks after the first dose.  Human papillomavirus (HPV) vaccine. Females aged 13-26 years who have not received the vaccine previously should obtain the 3-dose series. The vaccine is not recommended for use in pregnant females. However, pregnancy testing is not needed before receiving a dose. If a female is found to be pregnant after receiving a dose, no treatment is needed. In that case, the remaining doses should be delayed until after the pregnancy. Immunization is recommended for any person with an immunocompromised condition through the age of 61 years if she did not get any or all doses earlier. During the  3-dose series, the second dose should be obtained 4-8 weeks after the first dose. The third dose should be obtained 24 weeks after the first dose and 16 weeks after the second dose.  Zoster vaccine. One dose is recommended for adults aged 30 years or older unless certain conditions are present.  Measles, mumps, and rubella (MMR) vaccine. Adults born  before 1957 generally are considered immune to measles and mumps. Adults born in 1957 or later should have 1 or more doses of MMR vaccine unless there is a contraindication to the vaccine or there is laboratory evidence of immunity to each of the three diseases. A routine second dose of MMR vaccine should be obtained at least 28 days after the first dose for students attending postsecondary schools, health care workers, or international travelers. People who received inactivated measles vaccine or an unknown type of measles vaccine during 1963-1967 should receive 2 doses of MMR vaccine. People who received inactivated mumps vaccine or an unknown type of mumps vaccine before 1979 and are at high risk for mumps infection should consider immunization with 2 doses of MMR vaccine. For females of childbearing age, rubella immunity should be determined. If there is no evidence of immunity, females who are not pregnant should be vaccinated. If there is no evidence of immunity, females who are pregnant should delay immunization until after pregnancy. Unvaccinated health care workers born before 1957 who lack laboratory evidence of measles, mumps, or rubella immunity or laboratory confirmation of disease should consider measles and mumps immunization with 2 doses of MMR vaccine or rubella immunization with 1 dose of MMR vaccine.  Pneumococcal 13-valent conjugate (PCV13) vaccine. When indicated, a person who is uncertain of his immunization history and has no record of immunization should receive the PCV13 vaccine. All adults 65 years of age and older should receive this  vaccine. An adult aged 19 years or older who has certain medical conditions and has not been previously immunized should receive 1 dose of PCV13 vaccine. This PCV13 should be followed with a dose of pneumococcal polysaccharide (PPSV23) vaccine. Adults who are at high risk for pneumococcal disease should obtain the PPSV23 vaccine at least 8 weeks after the dose of PCV13 vaccine. Adults older than 50 years of age who have normal immune system function should obtain the PPSV23 vaccine dose at least 1 year after the dose of PCV13 vaccine.  Pneumococcal polysaccharide (PPSV23) vaccine. When PCV13 is also indicated, PCV13 should be obtained first. All adults aged 65 years and older should be immunized. An adult younger than age 65 years who has certain medical conditions should be immunized. Any person who resides in a nursing home or long-term care facility should be immunized. An adult smoker should be immunized. People with an immunocompromised condition and certain other conditions should receive both PCV13 and PPSV23 vaccines. People with human immunodeficiency virus (HIV) infection should be immunized as soon as possible after diagnosis. Immunization during chemotherapy or radiation therapy should be avoided. Routine use of PPSV23 vaccine is not recommended for American Indians, [place] Natives, or people younger than 65 years unless there are medical conditions that require PPSV23 vaccine. When indicated, people who have unknown immunization and have no record of immunization should receive PPSV23 vaccine. One-time revaccination 5 years after the first dose of PPSV23 is recommended for people aged 19-64 years who have chronic kidney failure, nephrotic syndrome, asplenia, or immunocompromised conditions. People who received 1-2 doses of PPSV23 before age 65 years should receive another dose of PPSV23 vaccine at age 65 years or later if at least 5 years have passed since the previous dose. Doses of PPSV23 are not  needed for people immunized with PPSV23 at or after age 65 years.  Meningococcal vaccine. Adults with asplenia or persistent complement component deficiencies should receive 2 doses of quadrivalent meningococcal conjugate (MenACWY-D) vaccine. The doses should be obtained   at least 2 months apart. Microbiologists working with certain meningococcal bacteria, [place] recruits, people at risk during an outbreak, and people who travel to or live in countries with a high rate of meningitis should be immunized. A first-year college student up through age 34 years who is living in a residence hall should receive a dose if she did not receive a dose on or after her 16th birthday. Adults who have certain high-risk conditions should receive one or more doses of vaccine.  Hepatitis A vaccine. Adults who wish to be protected from this disease, have certain high-risk conditions, work with hepatitis A-infected animals, work in hepatitis A research labs, or travel to or work in countries with a high rate of hepatitis A should be immunized. Adults who were previously unvaccinated and who anticipate close contact with an international adoptee during the first 60 days after arrival in the [place] from a country with a high rate of hepatitis A should be immunized.  Hepatitis B vaccine. Adults who wish to be protected from this disease, have certain high-risk conditions, may be exposed to blood or other infectious body fluids, are household contacts or sex partners of hepatitis B positive people, are clients or workers in certain care facilities, or travel to or work in countries with a high rate of hepatitis B should be immunized.  Haemophilus influenzae type b (Hib) vaccine. A previously unvaccinated person with asplenia or sickle cell disease or having a scheduled splenectomy should receive 1 dose of Hib vaccine. Regardless of previous immunization, a recipient of a hematopoietic stem cell transplant should receive a  3-dose series 6-12 months after her successful transplant. Hib vaccine is not recommended for adults with HIV infection. Preventive Services / Frequency Ages 35 to 4 years  Blood pressure check.** / Every 3-5 years.  Lipid and cholesterol check.** / Every 5 years beginning at age 60.  Clinical breast exam.** / Every 3 years for women in their 71s and 10s.  BRCA-related cancer risk assessment.** / For women who have family members with a BRCA-related cancer (breast, ovarian, tubal, or peritoneal cancers).  Pap test.** / Every 2 years from ages 76 through 26. Every 3 years starting at age 61 through age 76 or 93 with a history of 3 consecutive normal Pap tests.  HPV screening.** / Every 3 years from ages 37 through ages 60 to 51 with a history of 3 consecutive normal Pap tests.  Hepatitis C blood test.** / For any individual with known risks for hepatitis C.  Skin self-exam. / Monthly.  Influenza vaccine. / Every year.  Tetanus, diphtheria, and acellular pertussis (Tdap, Td) vaccine.** / Consult your health care provider. Pregnant women should receive 1 dose of Tdap vaccine during each pregnancy. 1 dose of Td every 10 years.  Varicella vaccine.** / Consult your health care provider. Pregnant females who do not have evidence of immunity should receive the first dose after pregnancy.  HPV vaccine. / 3 doses over 6 months, if 93 and younger. The vaccine is not recommended for use in pregnant females. However, pregnancy testing is not needed before receiving a dose.  Measles, mumps, rubella (MMR) vaccine.** / You need at least 1 dose of MMR if you were born in 1957 or later. You may also need a 2nd dose. For females of childbearing age, rubella immunity should be determined. If there is no evidence of immunity, females who are not pregnant should be vaccinated. If there is no evidence of immunity, females who are  pregnant should delay immunization until after pregnancy.  Pneumococcal  13-valent conjugate (PCV13) vaccine.** / Consult your health care provider.  Pneumococcal polysaccharide (PPSV23) vaccine.** / 1 to 2 doses if you smoke cigarettes or if you have certain conditions.  Meningococcal vaccine.** / 1 dose if you are age 68 to 8 years and a Market researcher living in a residence hall, or have one of several medical conditions, you need to get vaccinated against meningococcal disease. You may also need additional booster doses.  Hepatitis A vaccine.** / Consult your health care provider.  Hepatitis B vaccine.** / Consult your health care provider.  Haemophilus influenzae type b (Hib) vaccine.** / Consult your health care provider. Ages 7 to 53 years  Blood pressure check.** / Every year.  Lipid and cholesterol check.** / Every 5 years beginning at age 25 years.  Lung cancer screening. / Every year if you are aged 11-80 years and have a 30-pack-year history of smoking and currently smoke or have quit within the past 15 years. Yearly screening is stopped once you have quit smoking for at least 15 years or develop a health problem that would prevent you from having lung cancer treatment.  Clinical breast exam.** / Every year after age 48 years.  BRCA-related cancer risk assessment.** / For women who have family members with a BRCA-related cancer (breast, ovarian, tubal, or peritoneal cancers).  Mammogram.** / Every year beginning at age 41 years and continuing for as long as you are in good health. Consult with your health care provider.  Pap test.** / Every 3 years starting at age 65 years through age 37 or 70 years with a history of 3 consecutive normal Pap tests.  HPV screening.** / Every 3 years from ages 72 years through ages 60 to 40 years with a history of 3 consecutive normal Pap tests.  Fecal occult blood test (FOBT) of stool. / Every year beginning at age 21 years and continuing until age 5 years. You may not need to do this test if you get  a colonoscopy every 10 years.  Flexible sigmoidoscopy or colonoscopy.** / Every 5 years for a flexible sigmoidoscopy or every 10 years for a colonoscopy beginning at age 35 years and continuing until age 48 years.  Hepatitis C blood test.** / For all people born from [birth year] through 1965 and any individual with known risks for hepatitis C.  Skin self-exam. / Monthly.  Influenza vaccine. / Every year.  Tetanus, diphtheria, and acellular pertussis (Tdap/Td) vaccine.** / Consult your health care provider. Pregnant women should receive 1 dose of Tdap vaccine during each pregnancy. 1 dose of Td every 10 years.  Varicella vaccine.** / Consult your health care provider. Pregnant females who do not have evidence of immunity should receive the first dose after pregnancy.  Zoster vaccine.** / 1 dose for adults aged 30 years or older.  Measles, mumps, rubella (MMR) vaccine.** / You need at least 1 dose of MMR if you were born in 1957 or later. You may also need a second dose. For females of childbearing age, rubella immunity should be determined. If there is no evidence of immunity, females who are not pregnant should be vaccinated. If there is no evidence of immunity, females who are pregnant should delay immunization until after pregnancy.  Pneumococcal 13-valent conjugate (PCV13) vaccine.** / Consult your health care provider.  Pneumococcal polysaccharide (PPSV23) vaccine.** / 1 to 2 doses if you smoke cigarettes or if you have certain conditions.  Meningococcal vaccine.** /  Consult your health care provider.  Hepatitis A vaccine.** / Consult your health care provider.  Hepatitis B vaccine.** / Consult your health care provider.  Haemophilus influenzae type b (Hib) vaccine.** / Consult your health care provider. Ages 64 years and over  Blood pressure check.** / Every year.  Lipid and cholesterol check.** / Every 5 years beginning at age 23 years.  Lung cancer screening. / Every year if you  are aged 16-80 years and have a 30-pack-year history of smoking and currently smoke or have quit within the past 15 years. Yearly screening is stopped once you have quit smoking for at least 15 years or develop a health problem that would prevent you from having lung cancer treatment.  Clinical breast exam.** / Every year after age 74 years.  BRCA-related cancer risk assessment.** / For women who have family members with a BRCA-related cancer (breast, ovarian, tubal, or peritoneal cancers).  Mammogram.** / Every year beginning at age 44 years and continuing for as long as you are in good health. Consult with your health care provider.  Pap test.** / Every 3 years starting at age 58 years through age 22 or 39 years with 3 consecutive normal Pap tests. Testing can be stopped between 65 and 70 years with 3 consecutive normal Pap tests and no abnormal Pap or HPV tests in the past 10 years.  HPV screening.** / Every 3 years from ages 64 years through ages 70 or 61 years with a history of 3 consecutive normal Pap tests. Testing can be stopped between 65 and 70 years with 3 consecutive normal Pap tests and no abnormal Pap or HPV tests in the past 10 years.  Fecal occult blood test (FOBT) of stool. / Every year beginning at age 40 years and continuing until age 27 years. You may not need to do this test if you get a colonoscopy every 10 years.  Flexible sigmoidoscopy or colonoscopy.** / Every 5 years for a flexible sigmoidoscopy or every 10 years for a colonoscopy beginning at age 7 years and continuing until age 32 years.  Hepatitis C blood test.** / For all people born from [birth year] through 1965 and any individual with known risks for hepatitis C.  Osteoporosis screening.** / A one-time screening for women ages 30 years and over and women at risk for fractures or osteoporosis.  Skin self-exam. / Monthly.  Influenza vaccine. / Every year.  Tetanus, diphtheria, and acellular pertussis (Tdap/Td)  vaccine.** / 1 dose of Td every 10 years.  Varicella vaccine.** / Consult your health care provider.  Zoster vaccine.** / 1 dose for adults aged 35 years or older.  Pneumococcal 13-valent conjugate (PCV13) vaccine.** / Consult your health care provider.  Pneumococcal polysaccharide (PPSV23) vaccine.** / 1 dose for all adults aged 46 years and older.  Meningococcal vaccine.** / Consult your health care provider.  Hepatitis A vaccine.** / Consult your health care provider.  Hepatitis B vaccine.** / Consult your health care provider.  Haemophilus influenzae type b (Hib) vaccine.** / Consult your health care provider. ** Family history and personal history of risk and conditions may change your health care provider's recommendations.   This information is not intended to replace advice given to you by your health care provider. Make sure you discuss any questions you have with your health care provider.   Document Released: 03/12/2001 Document Revised: 02/04/2014 Document Reviewed: 06/11/2010 Elsevier Interactive Patient Education [employer].

## 2015-11-21 NOTE — Progress Notes (Signed)
Pre visit review using our clinic review tool, if applicable. No additional management support is needed unless otherwise documented below in the visit note. 

## 2015-11-22 LAB — LIPID PANEL
CHOL/HDL RATIO: 2
Cholesterol: 152 mg/dL (ref 0–200)
HDL: 73.3 mg/dL (ref >39.00)
LDL Cholesterol: 51 mg/dL (ref 0–99)
NONHDL: 78.21
Triglycerides: 134 mg/dL (ref 0.0–149.0)
VLDL: 26.8 mg/dL (ref 0.0–40.0)

## 2015-11-22 LAB — TSH: TSH: 1.1 u[IU]/mL (ref 0.35–4.50)

## 2015-11-22 LAB — HIV ANTIBODY (ROUTINE TESTING W REFLEX): HIV: NONREACTIVE

## 2015-11-26 DIAGNOSIS — K635 Polyp of colon: Secondary | ICD-10-CM | POA: Insufficient documentation

## 2015-11-26 DIAGNOSIS — Z1211 Encounter for screening for malignant neoplasm of colon: Secondary | ICD-10-CM | POA: Insufficient documentation

## 2015-11-26 NOTE — Assessment & Plan Note (Signed)
Encouraged to get adequate exercise, calcium and vitamin d intake 

## 2015-11-26 NOTE — Assessment & Plan Note (Signed)
Tolerating Arimedex.

## 2015-11-26 NOTE — Assessment & Plan Note (Addendum)
Patient encouraged to maintain heart healthy diet, regular exercise, adequate sleep. Consider daily probiotics. . Given and reviewed copy of ACP documents from [place] and encouraged to complete and returnprescribed. Colonoscopy is ordered for screening purposes.

## 2015-11-26 NOTE — Assessment & Plan Note (Signed)
Continue antihistamines and add Flonase and Singulair.

## 2015-12-05 ENCOUNTER — Encounter: Payer: Self-pay | Admitting: Family Medicine

## 2015-12-05 NOTE — Progress Notes (Unsigned)
[facility] Dr. Corinna Capra Endometrium curettage 11/23/2015

## 2015-12-26 MED FILL — MONTELUKAST SOD 10 MG TAB: 10 | 30 days supply | Qty: 30 | Fill #1

## 2016-01-02 ENCOUNTER — Ambulatory Visit (AMBULATORY_SURGERY_CENTER): Payer: Self-pay

## 2016-01-02 VITALS — Ht 66.0 in | Wt 138.8 lb

## 2016-01-02 DIAGNOSIS — Z1211 Encounter for screening for malignant neoplasm of colon: Secondary | ICD-10-CM

## 2016-01-02 MED ORDER — NA SULFATE-K SULFATE-MG SULF 17.5-3.13-1.6 GM/177ML PO SOLN: ORAL | 0 refills | Status: DC

## 2016-01-02 NOTE — Progress Notes (Signed)
Per pt, no allergies to soy or egg products.Pt not taking any weight loss meds or using  O2 at home. 

## 2016-01-03 MED FILL — SUPREP BOWEL PREP KIT: 17.5-3.13-1 | 1 days supply | Qty: 354 | Fill #0

## 2016-01-05 ENCOUNTER — Encounter: Payer: Self-pay | Admitting: Internal Medicine

## 2016-01-16 ENCOUNTER — Ambulatory Visit (AMBULATORY_SURGERY_CENTER): Payer: Managed Care, Other (non HMO) | Admitting: Internal Medicine

## 2016-01-16 ENCOUNTER — Encounter: Payer: Self-pay | Admitting: Internal Medicine

## 2016-01-16 VITALS — BP 108/70 | HR 63 | Temp 97.8°F | Resp 9 | Ht 66.0 in | Wt 138.0 lb

## 2016-01-16 DIAGNOSIS — Z1212 Encounter for screening for malignant neoplasm of rectum: Secondary | ICD-10-CM | POA: Diagnosis not present

## 2016-01-16 DIAGNOSIS — Z1211 Encounter for screening for malignant neoplasm of colon: Secondary | ICD-10-CM

## 2016-01-16 DIAGNOSIS — D123 Benign neoplasm of transverse colon: Secondary | ICD-10-CM | POA: Diagnosis not present

## 2016-01-16 DIAGNOSIS — D120 Benign neoplasm of cecum: Secondary | ICD-10-CM | POA: Diagnosis not present

## 2016-01-16 MED ORDER — SODIUM CHLORIDE 0.9 % IV SOLN: 500.0000 mL | INTRAVENOUS | Status: DC

## 2016-01-16 NOTE — Progress Notes (Signed)
Report to PACU, RN, vss, BBS= Clear.  

## 2016-01-16 NOTE — Op Note (Signed)
[facility] Patient Name: Abigail [name] Procedure Date: 01/16/2016 1:22 PM MRN: [id number] Endoscopist: Jerene Bears , MD Age: 50 Referring MD:  Date of Birth: 09/08/1965 Gender: Female Account #: [id number] Procedure:                Colonoscopy Indications:              Screening for colorectal malignant neoplasm, This                            is the patient's first colonoscopy Medicines:                Monitored Anesthesia Care Procedure:                Pre-Anesthesia Assessment:                           - Prior to the procedure, a History and Physical                            was performed, and patient medications and                            allergies were reviewed. The patient's tolerance of                            previous anesthesia was also reviewed. The risks                            and benefits of the procedure and the sedation                            options and risks were discussed with the patient.                            All questions were answered, and informed consent                            was obtained. Prior Anticoagulants: The patient has                            taken no previous anticoagulant or antiplatelet                            agents. ASA Grade Assessment: II - A patient with                            mild systemic disease. After reviewing the risks                            and benefits, the patient was deemed in                            satisfactory condition to undergo the procedure.  After obtaining informed consent, the colonoscope                            was passed under direct vision. Throughout the                            procedure, the patient's blood pressure, pulse, and                            oxygen saturations were monitored continuously. The                            Model PCF-H190DL [id number]) scope was introduced                            through the anus and  advanced to the the cecum,                            identified by appendiceal orifice and ileocecal                            valve. The colonoscopy was performed without                            difficulty. The patient tolerated the procedure                            well. The quality of the bowel preparation was                            good. The ileocecal valve, appendiceal orifice, and                            rectum were photographed. Scope In: 1:26:00 PM Scope Out: 1:49:34 PM Scope Withdrawal Time: 0 hours 13 minutes 43 seconds  Total Procedure Duration: 0 hours 23 minutes 34 seconds  Findings:                 The digital rectal exam was normal. [pertinent                            negatives].                           A 15 mm polyp was found in the ileocecal valve. The                            polyp was sessile with mucus cap. The polyp was                            removed with a cold snare. Resection and retrieval                            were complete.  A 6 mm polyp was found in the distal transverse                            colon. The polyp was sessile with mucus cap. The                            polyp was removed with a cold snare. Resection and                            retrieval were complete.                           Internal hemorrhoids were found during                            retroflexion. The hemorrhoids were small.                           The exam was otherwise without abnormality. Complications:            No immediate complications. Estimated Blood Loss:     Estimated blood loss was minimal. Impression:               - One 15 mm polyp at the ileocecal valve, removed                            with a cold snare. Resected and retrieved.                           - One 6 mm polyp in the distal transverse colon,                            removed with a cold snare. Resected and retrieved.                           -  Internal hemorrhoids.                           - The examination was otherwise normal. Recommendation:           - Patient has a contact number available for                            emergencies. The signs and symptoms of potential                            delayed complications were discussed with the                            patient. Return to normal activities tomorrow.                            Written discharge instructions were provided to the  patient.                           - Resume previous diet.                           - Continue present medications.                           - Await pathology results.                           - Repeat colonoscopy is recommended. The                            colonoscopy date will be determined after pathology                            results from today's exam become available for                            review. Jerene Bears, MD 01/16/2016 1:53:50 PM This report has been signed electronically.

## 2016-01-16 NOTE — Patient Instructions (Signed)
YOU HAD AN ENDOSCOPIC PROCEDURE TODAY AT [facility]:   Refer to the procedure report that was given to you for any specific questions about what was found during the examination.  If the procedure report does not answer your questions, please call your gastroenterologist to clarify.  If you requested that your care partner not be given the details of your procedure findings, then the procedure report has been included in a sealed envelope for you to review at your convenience later.  YOU SHOULD EXPECT: Some feelings of bloating in the abdomen. Passage of more gas than usual.  Walking can help get rid of the air that was put into your GI tract during the procedure and reduce the bloating. If you had a lower endoscopy (such as a colonoscopy or flexible sigmoidoscopy) you may notice spotting of blood in your stool or on the toilet paper. If you underwent a bowel prep for your procedure, you may not have a normal bowel movement for a few days.  Please Note:  You might notice some irritation and congestion in your nose or some drainage.  This is from the oxygen used during your procedure.  There is no need for concern and it should clear up in a day or so.  SYMPTOMS TO REPORT IMMEDIATELY:   Following lower endoscopy (colonoscopy or flexible sigmoidoscopy):  Excessive amounts of blood in the stool  Significant tenderness or worsening of abdominal pains  Swelling of the abdomen that is new, acute  Fever of 100F or higher  For urgent or emergent issues, a gastroenterologist can be reached at any hour by calling [phone].   DIET:  We do recommend a small meal at first, but then you may proceed to your regular diet.  Drink plenty of fluids but you should avoid alcoholic beverages for 24 hours.  ACTIVITY:  You should plan to take it easy for the rest of today and you should NOT DRIVE or use heavy machinery until tomorrow (because of the sedation medicines used during the test).     FOLLOW UP: Our staff will call the number listed on your records the next business day following your procedure to check on you and address any questions or concerns that you may have regarding the information given to you following your procedure. If we do not reach you, we will leave a message.  However, if you are feeling well and you are not experiencing any problems, there is no need to return our call.  We will assume that you have returned to your regular daily activities without incident.  If any biopsies were taken you will be contacted by phone or by letter within the next 1-3 weeks.  Please call us at [phone] if you have not heard about the biopsies in 3 weeks.    SIGNATURES/CONFIDENTIALITY: You and/or your care partner have signed paperwork which will be entered into your electronic medical record.  These signatures attest to the fact that that the information above on your After Visit Summary has been reviewed and is understood.  Full responsibility of the confidentiality of this discharge information lies with you and/or your care-partner.  Polyps (handout given) Hemorrhoids (handout given) High Fiber Diet (handout given) Await biopsy results to determined next repeat Colonoscopy

## 2016-01-17 ENCOUNTER — Telehealth: Payer: Self-pay | Admitting: *Deleted

## 2016-01-17 NOTE — Telephone Encounter (Signed)
  Follow up Call-  Call back number 01/16/2016  Post procedure Call Back phone  # [phone]  Permission to leave phone message Yes  Some recent data might be hidden     Patient questions:  Do you have a fever, pain , or abdominal swelling? No. Pain Score  0 *  Have you tolerated food without any problems? Yes.    Have you been able to return to your normal activities? Yes.    Do you have any questions about your discharge instructions: Diet   No. Medications  No. Follow up visit  No.  Do you have questions or concerns about your Care? No.  Actions: * If pain score is 4 or above: No action needed, pain <4.

## 2016-01-19 ENCOUNTER — Encounter: Payer: Self-pay | Admitting: Internal Medicine

## 2016-01-19 MED FILL — ZOLPIDEM TARTRATE 10 MG TAB: 10 | 30 days supply | Qty: 30 | Fill #2

## 2016-01-23 ENCOUNTER — Other Ambulatory Visit: Payer: Self-pay | Admitting: Nurse Practitioner

## 2016-01-24 ENCOUNTER — Other Ambulatory Visit: Payer: Self-pay | Admitting: Nurse Practitioner

## 2016-02-13 MED FILL — MONTELUKAST SOD 10 MG TAB: 10 | 30 days supply | Qty: 30 | Fill #2

## 2016-03-14 MED FILL — MONTELUKAST SOD 10 MG TAB: 10 | 30 days supply | Qty: 30 | Fill #3

## 2016-04-08 ENCOUNTER — Ambulatory Visit (HOSPITAL_BASED_OUTPATIENT_CLINIC_OR_DEPARTMENT_OTHER): Payer: BLUE CROSS/BLUE SHIELD | Admitting: Oncology

## 2016-04-08 ENCOUNTER — Other Ambulatory Visit (HOSPITAL_BASED_OUTPATIENT_CLINIC_OR_DEPARTMENT_OTHER): Payer: BLUE CROSS/BLUE SHIELD

## 2016-04-08 VITALS — BP 110/63 | HR 64 | Temp 97.9°F | Resp 20 | Ht 66.0 in | Wt 137.4 lb

## 2016-04-08 DIAGNOSIS — C50212 Malignant neoplasm of upper-inner quadrant of left female breast: Secondary | ICD-10-CM | POA: Diagnosis not present

## 2016-04-08 DIAGNOSIS — Z170 Estrogen receptor positive status [ER+]: Principal | ICD-10-CM

## 2016-04-08 DIAGNOSIS — Z853 Personal history of malignant neoplasm of breast: Secondary | ICD-10-CM | POA: Diagnosis not present

## 2016-04-08 LAB — CBC WITH DIFFERENTIAL/PLATELET
BASO%: 1 % (ref 0.0–2.0)
Basophils Absolute: 0 10*3/uL (ref 0.0–0.1)
EOS%: 3.3 % (ref 0.0–7.0)
Eosinophils Absolute: 0.1 10*3/uL (ref 0.0–0.5)
HEMATOCRIT: 38.5 % (ref 34.8–46.6)
HGB: 12.9 g/dL (ref 11.6–15.9)
LYMPH#: 1.5 10*3/uL (ref 0.9–3.3)
LYMPH%: 39.7 % (ref 14.0–49.7)
MCH: 31.6 pg (ref 25.1–34.0)
MCHC: 33.4 g/dL (ref 31.5–36.0)
MCV: 94.6 fL (ref 79.5–101.0)
MONO#: 0.4 10*3/uL (ref 0.1–0.9)
MONO%: 9.3 % (ref 0.0–14.0)
NEUT#: 1.8 10*3/uL (ref 1.5–6.5)
NEUT%: 46.7 % (ref 38.4–76.8)
Platelets: 226 10*3/uL (ref 145–400)
RBC: 4.07 10*6/uL (ref 3.70–5.45)
RDW: 12.4 % (ref 11.2–14.5)
WBC: 3.8 10*3/uL — ABNORMAL LOW (ref 3.9–10.3)

## 2016-04-08 LAB — COMPREHENSIVE METABOLIC PANEL
ALT: 20 U/L (ref 0–55)
AST: 21 U/L (ref 5–34)
Albumin: 4.3 g/dL (ref 3.5–5.0)
Alkaline Phosphatase: 64 U/L (ref 40–150)
Anion Gap: 7 mEq/L (ref 3–11)
BUN: 19.5 mg/dL (ref 7.0–26.0)
CHLORIDE: 106 meq/L (ref 98–109)
CO2: 30 meq/L — AB (ref 22–29)
CREATININE: 0.9 mg/dL (ref 0.6–1.1)
Calcium: 10.2 mg/dL (ref 8.4–10.4)
EGFR: 79 mL/min/{1.73_m2} — ABNORMAL LOW (ref >90)
GLUCOSE: 87 mg/dL (ref 70–140)
Potassium: 4.2 mEq/L (ref 3.5–5.1)
SODIUM: 143 meq/L (ref 136–145)
Total Bilirubin: 0.61 mg/dL (ref 0.20–1.20)
Total Protein: 7.1 g/dL (ref 6.4–8.3)

## 2016-04-08 NOTE — Progress Notes (Signed)
ID: Abigail [name]   DOB: [birth year]  MR#: [id number]  HQR#:[id number]  PCP: Abigail [name], [name] SU: Abigail [name] GYN: Abigail [name] OTHER: Abigail [name], Abigail [name]  HISTORY OF PRESENT ILLNESS: From the original intake note:  The patient is a 51 year old [place] woman who had screening mammography at [facility] 02/01/2011, showing calcifications in the left breast. She returned for diagnostic left mammography 02/21/2011, and this showed a 1.1 cm cluster of pleomorphic microcalcifications in the upper inner aspect of the left breast. Biopsy of this area was performed the same day, and showed ([id number]) An invasive ductal carcinoma, grade 2, which was estrogen receptor positive at 89%, progesterone receptor positive at 99%, with an MIB-1 of 30% and no HER-2 amplification. The patient was then set up for bilateral breast MRIs, performed 02/26/2011. There were multiple small cysts in both breasts. In the upper inner quadrant of the left breast there was a 2.7 cm enhancing mass with ill-defined margins. This contained a biopsy clip. There was a hematoma superior to this mass, measuring 2.2 cm. There were no other suspicious areas in either breast and no abnormal appearing lymph nodes.  With this information the patient's case was presented at the multidisciplinary breast cancer conference 02/27/2011, and she was seen in the [facility] clinic the same day. A decision was made to proceed with neoadjuvant treatment.   Her subsequent history is as detailed below.  INTERVAL HISTORY: Presly returns today for follow-up of her estrogen receptor positive breast cancer. Since her last visit here, she went on anastrozole, starting 01/29/2016 she initially did well, but within a few weeks felt "dry everywhere", not just vaginally but her skin eyes and "everywhere". Subsequently she developed hives, including her scalp. Her mouth felt funny. She had a variety of symptoms therefore and stopped the anastrozole week ago.  Since stopping the anastrozole although symptoms including the dryness, hives, funny feeling in her mouth etc., have resolved. Of note she did not have joint symptoms were hot flashes.  She is here today to discuss whether or not to resume tamoxifen or try different aromatase inhibitor.   REVIEW OF SYSTEMS: A detailed review of systems today was otherwise noncontributory  PAST MEDICAL HISTORY: Past Medical History:  Diagnosis Date  . Allergic state 12/04/2010  . Allergy    seasonal- spring  . Anemia    mild  . Anxiety 02/11/2011  . BRCA gene mutation positive 11/21/2015  . Breast cancer ([place]) 06/05/11   RIGHT  BX,POSTERIOR ,SLIGHT LATERAL=FOCAL ATYPIA LOBULAR HYPERPLASIA IN BACKGROUND OF EXTENSIVE STROMAL FIBROSIS  . Cancer ([place]) 02/21/11   Lt breast CA,BXINVASIVE DUCTAL CA GRADE 2,ER/PR=POSITIVE  . Chicken pox as a child  . History of mononucleosis   . History of streptococcal pharyngitis   . Hot flashes   . Insomnia 02/11/2011  . Post-operative nausea and vomiting    past c-section  . Preventative health care 12/04/2010  . S/P chemotherapy, time since less than 4 weeks 05/23/2011   completed 4 doses docetaxel/cyclophosphamide  . Urticaria   . UTI (lower urinary tract infection) 11/28/2011  . Vaginitis 10/08/2011    PAST SURGICAL HISTORY: Past Surgical History:  Procedure Laterality Date  . BREAST RECONSTRUCTION Bilateral 03/24/2012   Procedure: BREAST RECONSTRUCTION WITH PLACEMENT OF SILICONE GEL IMPLANTS;  Surgeon: Abigail [name], [name];  Location: [place];  Service: Plastics;  Laterality: Bilateral;  . [place]   X 1  . MASTECTOMY W/ SENTINEL NODE BIOPSY  08/07/2011   Procedure: MASTECTOMY  WITH SENTINEL LYMPH NODE BIOPSY;  Surgeon: Abigail [name], [name];  Location: [place];  Service: General;  Laterality: Bilateral;  bilateral mastectomy  . MOUTH SURGERY  1997   gum surgery  . PORT-A-CATH REMOVAL  08/07/2011   Procedure: REMOVAL PORT-A-CATH;  Surgeon: Abigail [name], [name];   Location: [place];  Service: General;  Laterality: Right;  . PORTACATH PLACEMENT  03/20/2011   Procedure: INSERTION PORT-A-CATH;  Surgeon: Abigail [name], [name];  Location: [place];  Service: General;  Laterality: Right;  placement of port -Right Subclavian Vein  . TISSUE EXPANDER PLACEMENT  08/07/2011   Procedure: TISSUE EXPANDER;  Surgeon: Abigail [name], [name];  Location: [place];  Service: Plastics;  Laterality: Bilateral;  BILATERAL PLACEMENT OF TISSUE EXPANDERS FOR BREAST RECONSTRUCTION  . TUBAL LIGATION     1999  . VARICOSE VEIN SURGERY     [place] [facility]    FAMILY HISTORY Family History  Problem Relation Age of Onset  . Adopted: Yes  . Heart disease Mother   . Heart disease Maternal Uncle   . Other Maternal Grandmother     mad cow disease  The patient has contacted her biological father and found that there is no breast or ovarian cancer in that side of the family there are several female relatives with prostate cancer in the 42s and 42s, and one paternal aunt with colon cancer.  Gynecologic history: She had menarche age 30, was still having regular periods at the time of her diagnosis, but periods stopped with her chemotherapy. She is s/p BTL.She used birth control pills for approximately 10 years, with no clotting or other complications. She is GX P2. Age at first live birth was 49. 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 Her husband Abigail [name] is in accounting. Son Abigail [name] (pronounced with a hard "g") is in college studying are not a Recruitment consultant, daughter Abigail [name] is high school junior  ADVANCED DIRECTIVES: Not in place  HEALTH MAINTENANCE: Social History  Substance Use Topics  . Smoking status: Never Smoker  . Smokeless tobacco: Never Used  . Alcohol use Yes     Comment: occasionally- social  2 drinks      Colonoscopy: 01/16/2016 PAP:  Bone density:  Lipid panel:  Allergies  Allergen Reactions  . Penicillins Rash    Current Outpatient Prescriptions  Medication Sig Dispense Refill  . anastrozole (ARIMIDEX) 1 MG tablet Take 1 tablet (1 mg total) by mouth daily. (Patient not taking: Reported on 01/16/2016) 90 tablet 4  . calcium citrate (CALCITRATE - DOSED IN MG ELEMENTAL CALCIUM) 950 MG tablet Take 600 mg of elemental calcium by mouth daily.     . ferrous sulfate 325 (65 FE) MG EC tablet Take 325 mg by mouth 3 (three) times daily with meals.    Marland Kitchen loratadine (CLARITIN) 10 MG tablet Take 10 mg by mouth 2 (two) times daily.     . montelukast (SINGULAIR) 10 MG tablet Take 1 tablet (10 mg total) by mouth at bedtime as needed. (Patient taking differently: Take 10 mg by mouth at bedtime. ) 30 tablet 3  . zolpidem (AMBIEN) 10 MG tablet Take 0.5 tablets (5 mg total) by mouth at bedtime as needed for sleep. For sleep, pt takes 1/3 of tablet 30 tablet 0   Current Facility-Administered Medications  Medication Dose Route Frequency Provider Last Rate  Last Dose  . 0.9 %  sodium chloride infusion  500 mL Intravenous Continuous Abigail Bears, [name]        OBJECTIVE: Middle-aged white womanWho appears well  Vitals:   04/08/16 1216  BP: 110/63  Pulse: 64  Resp: 20  Temp: 97.9 F (36.6 C)     Body mass index is 22.18 kg/m.    ECOG FS: 0  Sclerae unicteric, EOMs intact Oropharynx clear and moist No cervical or supraclavicular adenopathy Lungs no rales or rhonchi Heart regular rate and rhythm Abd soft, nontender, positive bowel sounds MSK no focal spinal tenderness, no upper extremity lymphedema Neuro: nonfocal, well oriented, appropriate affect Breasts: Status post bilateral mastectomies with implants in place. There are no suspicious findings. Both axillae are benign.   LAB RESULTS: Lab Results  Component Value Date   WBC 3.8 (L) 04/08/2016   NEUTROABS 1.8 04/08/2016   HGB 12.9  04/08/2016   HCT 38.5 04/08/2016   MCV 94.6 04/08/2016   PLT 226 04/08/2016      Chemistry      Component Value Date/Time   NA 143 04/08/2016 1126   K 4.2 04/08/2016 1126   CL 101 02/13/2015 1454   CL 104 05/07/2012 1138   CO2 30 (H) 04/08/2016 1126   BUN 19.5 04/08/2016 1126   CREATININE 0.9 04/08/2016 1126      Component Value Date/Time   CALCIUM 10.2 04/08/2016 1126   ALKPHOS 64 04/08/2016 1126   AST 21 04/08/2016 1126   ALT 20 04/08/2016 1126   BILITOT 0.61 04/08/2016 1126       Lab Results  Component Value Date   LABCA2 23 02/27/2011     STUDIES: No results found.  ASSESSMENT: 51 y.o.  [place] woman with a BRCA mutation of uncertain significance  (1) status post left breast upper inner quadrant biopsy 01/24/ 2013, for a clinical T2 N0, stage IIA invasive ductal carcinoma, grade 2, strongly estrogen and progesterone receptor positive, HER-2 not amplified, with an MIB-1 of 30%.  (a) her cancer restages as IB in the current 2018 prognostic classification   (2) status post 4 doses of docetaxel/ cyclophosphamide, completed 05/23/2011  (3) on tamoxifen between May 2013 and October 2017  (4) s/p bilateral mastectomies 08/07/2011 showing  (a) on the Right, atypical lobular hyperplasia, no malignancy  (b) on the left, a residual pT1c pN0 invasive ductal carcinoma, estrogen receptor 97% and progesterone receptor 85% positive, HER-2 not amplified.  (5) completed implant reconstruction February 2014  (6) s/p bilateral salpingo-oophorectomy November 2014 with benign pathology   PLAN: Abigail [name] is now nearly 5 years out from definitive surgery for her breast cancer, with no evidence of recurrence. This is very favorable.  We discussed the fact that according to the new prognostic AJCC classification her breast cancer has been down staged to IB. This indicates a more favorable prognosis and in that light I am comfortable with her not receiving any further  anti-estrogens.  Specifically she received tamoxifen for 5-1/2 years. She gave anastrozole a try, but was unable to tolerated it, and my suspicion is that she will not be able to tolerate any of the other aromatase inhibitors because the [name] issue is the absence of estrogen, causing significant dryness symptoms.  Accordingly I am comfortable with her not going back to tamoxifen or an aromatase inhibitor. I am releasing her to her primary care physician. All she will need internist of breast cancer follow-up is yearly chest wall/breast exam by a physician  I will be glad to see Abigail [name] at any point in the future if on when the need arises, but as of now are making no further routine appointments for her here.       Orpha Dain C    04/08/2016

## 2016-04-20 ENCOUNTER — Encounter (HOSPITAL_COMMUNITY): Payer: Self-pay

## 2016-05-02 ENCOUNTER — Encounter: Payer: Self-pay | Admitting: Family Medicine

## 2016-05-02 DIAGNOSIS — T7840XD Allergy, unspecified, subsequent encounter: Secondary | ICD-10-CM

## 2016-05-02 MED ORDER — MONTELUKAST SODIUM 10 MG PO TABS: 10.0000 mg | ORAL_TABLET | Freq: Every day | ORAL | 0 refills | Status: DC

## 2016-05-02 MED FILL — MONTELUKAST SOD 10 MG TAB: 10 | 30 days supply | Qty: 30 | Fill #0

## 2016-07-09 MED FILL — MONTELUKAST SOD 10 MG TAB: 10 | 30 days supply | Qty: 30 | Fill #1

## 2016-10-03 DIAGNOSIS — Z01419 Encounter for gynecological examination (general) (routine) without abnormal findings: Secondary | ICD-10-CM | POA: Diagnosis not present

## 2016-10-03 DIAGNOSIS — Z6822 Body mass index (BMI) 22.0-22.9, adult: Secondary | ICD-10-CM | POA: Diagnosis not present

## 2016-10-03 MED FILL — ZOLPIDEM TARTRATE 10 MG TAB: 10 | 15 days supply | Qty: 15 | Fill #0

## 2016-11-14 MED FILL — MONTELUKAST SOD 10 MG TAB: 10 | 30 days supply | Qty: 30 | Fill #2

## 2016-11-22 ENCOUNTER — Encounter: Payer: Managed Care, Other (non HMO) | Admitting: Family Medicine

## 2016-11-25 ENCOUNTER — Encounter: Payer: Self-pay | Admitting: Family Medicine

## 2016-11-25 ENCOUNTER — Ambulatory Visit (INDEPENDENT_AMBULATORY_CARE_PROVIDER_SITE_OTHER): Payer: BLUE CROSS/BLUE SHIELD | Admitting: Family Medicine

## 2016-11-25 DIAGNOSIS — Z0000 Encounter for general adult medical examination without abnormal findings: Secondary | ICD-10-CM | POA: Diagnosis not present

## 2016-11-25 DIAGNOSIS — T7840XD Allergy, unspecified, subsequent encounter: Secondary | ICD-10-CM

## 2016-11-25 DIAGNOSIS — Z170 Estrogen receptor positive status [ER+]: Secondary | ICD-10-CM

## 2016-11-25 DIAGNOSIS — C50212 Malignant neoplasm of upper-inner quadrant of left female breast: Secondary | ICD-10-CM

## 2016-11-25 LAB — COMPREHENSIVE METABOLIC PANEL
ALK PHOS: 64 U/L (ref 39–117)
ALT: 19 U/L (ref 0–35)
AST: 21 U/L (ref 0–37)
Albumin: 4.3 g/dL (ref 3.5–5.2)
BILIRUBIN TOTAL: 0.4 mg/dL (ref 0.2–1.2)
BUN: 17 mg/dL (ref 6–23)
CO2: 30 mEq/L (ref 19–32)
Calcium: 9.7 mg/dL (ref 8.4–10.5)
Chloride: 104 mEq/L (ref 96–112)
Creatinine, Ser: 0.68 mg/dL (ref 0.40–1.20)
GFR: 97 mL/min (ref >60.00)
Glucose, Bld: 94 mg/dL (ref 70–99)
POTASSIUM: 4.3 meq/L (ref 3.5–5.1)
SODIUM: 141 meq/L (ref 135–145)
TOTAL PROTEIN: 6.9 g/dL (ref 6.0–8.3)

## 2016-11-25 LAB — TSH: TSH: 0.99 u[IU]/mL (ref 0.35–4.50)

## 2016-11-25 LAB — CBC
HCT: 39.7 % (ref 36.0–46.0)
Hemoglobin: 12.9 g/dL (ref 12.0–15.0)
MCHC: 32.6 g/dL (ref 30.0–36.0)
MCV: 95.8 fl (ref 78.0–100.0)
Platelets: 250 10*3/uL (ref 150.0–400.0)
RBC: 4.15 Mil/uL (ref 3.87–5.11)
RDW: 12.5 % (ref 11.5–15.5)
WBC: 5 10*3/uL (ref 4.0–10.5)

## 2016-11-25 LAB — LIPID PANEL
Cholesterol: 143 mg/dL (ref 0–200)
HDL: 61.4 mg/dL (ref >39.00)
LDL Cholesterol: 71 mg/dL (ref 0–99)
NONHDL: 81.32
Total CHOL/HDL Ratio: 2
Triglycerides: 53 mg/dL (ref 0.0–149.0)
VLDL: 10.6 mg/dL (ref 0.0–40.0)

## 2016-11-25 MED ORDER — MONTELUKAST SODIUM 10 MG PO TABS: 10.0000 mg | ORAL_TABLET | Freq: Every evening | ORAL | 5 refills | Status: DC | PRN

## 2016-11-25 NOTE — Patient Instructions (Addendum)
Nasal saline flushes as needed Flonase as needed Can increase the Claritin to twice daily as needed Shingrix is the new shingles, 2-6 months apart at pharmacy or call here to get immunizations. Shingrix Preventive Care 40-64 Years, Female Preventive care refers to lifestyle choices and visits with your health care provider that can promote health and wellness. What does preventive care include?  A yearly physical exam. This is also called an annual well check.  Dental exams once or twice a year.  Routine eye exams. Ask your health care provider how often you should have your eyes checked.  Personal lifestyle choices, including: ? Daily care of your teeth and gums. ? Regular physical activity. ? Eating a healthy diet. ? Avoiding tobacco and drug use. ? Limiting alcohol use. ? Practicing safe sex. ? Taking low-dose aspirin daily starting at age 49. ? Taking vitamin and mineral supplements as recommended by your health care provider. What happens during an annual well check? The services and screenings done by your health care provider during your annual well check will depend on your age, overall health, lifestyle risk factors, and family history of disease. Counseling Your health care provider may ask you questions about your:  Alcohol use.  Tobacco use.  Drug use.  Emotional well-being.  Home and relationship well-being.  Sexual activity.  Eating habits.  Work and work Statistician.  Method of birth control.  Menstrual cycle.  Pregnancy history.  Screening You may have the following tests or measurements:  Height, weight, and BMI.  Blood pressure.  Lipid and cholesterol levels. These may be checked every 5 years, or more frequently if you are over 22 years old.  Skin check.  Lung cancer screening. You may have this screening every year starting at age 12 if you have a 30-pack-year history of smoking and currently smoke or have quit within the past 15  years.  Fecal occult blood test (FOBT) of the stool. You may have this test every year starting at age 39.  Flexible sigmoidoscopy or colonoscopy. You may have a sigmoidoscopy every 5 years or a colonoscopy every 10 years starting at age 63.  Hepatitis C blood test.  Hepatitis B blood test.  Sexually transmitted disease (STD) testing.  Diabetes screening. This is done by checking your blood sugar (glucose) after you have not eaten for a while (fasting). You may have this done every 1-3 years.  Mammogram. This may be done every 1-2 years. Talk to your health care provider about when you should start having regular mammograms. This may depend on whether you have a family history of breast cancer.  BRCA-related cancer screening. This may be done if you have a family history of breast, ovarian, tubal, or peritoneal cancers.  Pelvic exam and Pap test. This may be done every 3 years starting at age 18. Starting at age 84, this may be done every 5 years if you have a Pap test in combination with an HPV test.  Bone density scan. This is done to screen for osteoporosis. You may have this scan if you are at high risk for osteoporosis.  Discuss your test results, treatment options, and if necessary, the need for more tests with your health care provider. Vaccines Your health care provider may recommend certain vaccines, such as:  Influenza vaccine. This is recommended every year.  Tetanus, diphtheria, and acellular pertussis (Tdap, Td) vaccine. You may need a Td booster every 10 years.  Varicella vaccine. You may need this if you have  not been vaccinated.  Zoster vaccine. You may need this after age 13.  Measles, mumps, and rubella (MMR) vaccine. You may need at least one dose of MMR if you were born in 1957 or later. You may also need a second dose.  Pneumococcal 13-valent conjugate (PCV13) vaccine. You may need this if you have certain conditions and were not previously  vaccinated.  Pneumococcal polysaccharide (PPSV23) vaccine. You may need one or two doses if you smoke cigarettes or if you have certain conditions.  Meningococcal vaccine. You may need this if you have certain conditions.  Hepatitis A vaccine. You may need this if you have certain conditions or if you travel or work in places where you may be exposed to hepatitis A.  Hepatitis B vaccine. You may need this if you have certain conditions or if you travel or work in places where you may be exposed to hepatitis B.  Haemophilus influenzae type b (Hib) vaccine. You may need this if you have certain conditions.  Talk to your health care provider about which screenings and vaccines you need and how often you need them. This information is not intended to replace advice given to you by your health care provider. Make sure you discuss any questions you have with your health care provider. Document Released: 02/10/2015 Document Revised: 10/04/2015 Document Reviewed: 11/15/2014 Elsevier Interactive Patient Education  2017 [employer].

## 2016-11-25 NOTE — Assessment & Plan Note (Signed)
Uses Claritin year round and adds Singulair this time of year with good results. Can use flonase nasal saline and increase Claritin to bid as needed

## 2016-11-25 NOTE — Assessment & Plan Note (Signed)
Patient encouraged to maintain heart healthy diet, regular exercise, adequate sleep. Consider daily probiotics. Take medications as prescribed, labs ordered.

## 2016-11-25 NOTE — Assessment & Plan Note (Signed)
Has been released by cancer center and is doing well.

## 2016-11-25 NOTE — Progress Notes (Signed)
Subjective:  I acted as a Education administrator for Dr. Charlett Blake. [place], [place]  Patient ID: [name], female    DOB: February 06, 1965, 51 y.o.   MRN: [id number]  No chief complaint on file.   HPI  Patient is in today for an annual exam And she overall is doing well. 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 No recent febrile illness or hospitalization. She is trying to maintain a heart healthy diet and stay active.   Patient Care Team: [name], MD as PCP - General (Family Medicine) Magrinat, Virgie Dad, MD as Consulting Physician (Oncology) Louretta Shorten, MD as Consulting Physician (Obstetrics and Gynecology) Orbie Hurst, MD as Referring Physician (Dermatology)   Past Medical History:  Diagnosis Date  . Allergic state 12/04/2010  . Allergy    seasonal- spring  . Anemia    mild  . Anxiety 02/11/2011  . BRCA gene mutation positive 11/21/2015  . Breast cancer ([facility]) 06/05/11   RIGHT  BX,POSTERIOR ,SLIGHT LATERAL=FOCAL ATYPIA LOBULAR HYPERPLASIA IN BACKGROUND OF EXTENSIVE STROMAL FIBROSIS  . 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 Chicken pox as a child  . History of mononucleosis   . History of streptococcal pharyngitis   . Hot flashes   . Insomnia 02/11/2011  . Post-operative nausea and vomiting    past c-section  . Preventative health care 12/04/2010  . S/P chemotherapy, time since less than 4 weeks 05/23/2011   completed 4 doses docetaxel/cyclophosphamide  . Urticaria   . UTI (lower urinary tract infection) 11/28/2011  . Vaginitis 10/08/2011    Past Surgical History:  Procedure Laterality Date  . BREAST RECONSTRUCTION Bilateral 03/24/2012   Procedure: BREAST RECONSTRUCTION WITH PLACEMENT OF SILICONE GEL IMPLANTS;  Surgeon: Crissie Reese, MD;  Location: [facility];  Service:  Plastics;  Laterality: Bilateral;  . [place]   X 1  . MASTECTOMY W/ SENTINEL NODE BIOPSY  08/07/2011   Procedure: MASTECTOMY WITH SENTINEL LYMPH NODE BIOPSY;  Surgeon: Merrie Roof, MD;  Location: [facility];  Service: General;  Laterality: Bilateral;  bilateral mastectomy  . MOUTH SURGERY  1997   gum surgery  . PORT-A-CATH REMOVAL  08/07/2011   Procedure: REMOVAL PORT-A-CATH;  Surgeon: Merrie Roof, MD;  Location: [facility];  Service: General;  Laterality: Right;  . PORTACATH PLACEMENT  03/20/2011   Procedure: INSERTION PORT-A-CATH;  Surgeon: Merrie Roof, MD;  Location: [facility];  Service: General;  Laterality: Right;  placement of port -Right Subclavian Vein  . TISSUE EXPANDER PLACEMENT  08/07/2011   Procedure: TISSUE EXPANDER;  Surgeon: Crissie Reese, MD;  Location: [facility];  Service: Plastics;  Laterality: Bilateral;  BILATERAL PLACEMENT OF TISSUE EXPANDERS FOR BREAST RECONSTRUCTION  . TUBAL LIGATION     1999  . VARICOSE VEIN SURGERY     [facility]    Family History  Problem Relation Age of Onset  . Adopted: Yes  . Heart disease Mother   . Heart disease Maternal Uncle   . Other Maternal Grandmother        mad cow disease  . Cancer Other        prostate cancer, diffuse in uncles, GF    Social History   Social History  . Marital status: Married  Spouse name: N/A  . Number of children: 2  . Years of education: N/A   Occupational History  . [employer]   Social History Main Topics  . Smoking status: Never Smoker  . Smokeless tobacco: Never Used  . Alcohol use Yes     Comment: occasionally- social  2 drinks  . Drug use: No  . Sexual activity: Yes    Birth control/ protection: Surgical   Other Topics Concern  . Not on file   Social History Narrative  . No narrative on file    Outpatient Medications Prior to Visit  Medication Sig Dispense Refill  . anastrozole (ARIMIDEX) 1 MG tablet Take 1 tablet (1  mg total) by mouth daily. (Patient not taking: Reported on 01/16/2016) 90 tablet 4  . calcium citrate (CALCITRATE - DOSED IN MG ELEMENTAL CALCIUM) 950 MG tablet Take 600 mg of elemental calcium by mouth daily.     . ferrous sulfate 325 (65 FE) MG EC tablet Take 325 mg by mouth 3 (three) times daily with meals.    Marland Kitchen loratadine (CLARITIN) 10 MG tablet Take 10 mg by mouth 2 (two) times daily.     Marland Kitchen zolpidem (AMBIEN) 10 MG tablet Take 0.5 tablets (5 mg total) by mouth at bedtime as needed for sleep. For sleep, pt takes 1/3 of tablet 30 tablet 0  . montelukast (SINGULAIR) 10 MG tablet Take 1 tablet (10 mg total) by mouth at bedtime. 90 tablet 0   Facility-Administered Medications Prior to Visit  Medication Dose Route Frequency Provider Last Rate Last Dose  . 0.9 %  sodium chloride infusion  500 mL Intravenous Continuous Pyrtle, Lajuan Lines, MD        Allergies  Allergen Reactions  . Penicillins Rash    Review of Systems  Constitutional: Negative for chills, fever and malaise/fatigue.  HENT: Negative for congestion and hearing loss.   Eyes: Negative for discharge.  Respiratory: Negative for cough, sputum production and shortness of breath.   Cardiovascular: Negative for chest pain, palpitations and leg swelling.  Gastrointestinal: Negative for abdominal pain, blood in stool, constipation, diarrhea, heartburn, nausea and vomiting.  Genitourinary: Negative for dysuria, frequency, hematuria and urgency.  Musculoskeletal: Negative for back pain, falls and myalgias.  Skin: Negative for rash.  Neurological: Negative for dizziness, sensory change, loss of consciousness, weakness and headaches.  Endo/Heme/Allergies: Negative for environmental allergies. Does not bruise/bleed easily.  Psychiatric/Behavioral: Negative for depression and suicidal ideas. The patient is nervous/anxious. The patient does not have insomnia.        Objective:    Physical Exam  Constitutional: She is oriented to person,  place, and time. She appears well-developed and well-nourished. No distress.  HENT:  Head: Normocephalic and atraumatic.  Eyes: Conjunctivae are normal.  Neck: Neck supple. No thyromegaly present.  Cardiovascular: Normal rate, regular rhythm and normal heart sounds.   No murmur heard. Pulmonary/Chest: Effort normal and breath sounds normal. No respiratory distress.  Abdominal: Soft. Bowel sounds are normal. She exhibits no distension and no mass. There is no tenderness.  Musculoskeletal: She exhibits no edema.  Lymphadenopathy:    She has no cervical adenopathy.  Neurological: She is alert and oriented to person, place, and time.  Skin: Skin is warm and dry.  Psychiatric: She has a normal mood and affect. Her behavior is normal.    BP 100/62 (BP Location: Left Arm, Patient Position: Sitting, Cuff Size: Normal)   Pulse 71   Temp 98.1 F (36.7 C) (Oral)  Resp 18   Ht 5' 6"  (1.676 m)   Wt 141 lb 3.2 oz (64 kg)   LMP 03/02/2011   SpO2 98%   BMI 22.79 kg/m  Wt Readings from Last 3 Encounters:  11/25/16 141 lb 3.2 oz (64 kg)  04/08/16 137 lb 6.4 oz (62.3 kg)  01/16/16 138 lb (62.6 kg)   BP Readings from Last 3 Encounters:  11/25/16 100/62  04/08/16 110/63  01/16/16 108/70     Immunization History  Administered Date(s) Administered  . Influenza Whole 12/29/2010  . Influenza-Unspecified 10/12/2016  . Tdap 02/11/2011    Health Maintenance  Topic Date Due  . PAP SMEAR  02/10/2014  . MAMMOGRAM  01/07/2016  . COLONOSCOPY  01/16/2019  . TETANUS/TDAP  02/10/2021  . INFLUENZA VACCINE  Addressed  . HIV Screening  Completed    Lab Results  Component Value Date   WBC 3.8 (L) 04/08/2016   HGB 12.9 04/08/2016   HCT 38.5 04/08/2016   PLT 226 04/08/2016   GLUCOSE 87 04/08/2016   CHOL 152 11/21/2015   TRIG 134.0 11/21/2015   HDL 73.30 11/21/2015   LDLCALC 51 11/21/2015   ALT 20 04/08/2016   AST 21 04/08/2016   NA 143 04/08/2016   K 4.2 04/08/2016   CL 101 02/13/2015    CREATININE 0.9 04/08/2016   BUN 19.5 04/08/2016   CO2 30 (H) 04/08/2016   TSH 1.10 11/21/2015    Lab Results  Component Value Date   TSH 1.10 11/21/2015   Lab Results  Component Value Date   WBC 3.8 (L) 04/08/2016   HGB 12.9 04/08/2016   HCT 38.5 04/08/2016   MCV 94.6 04/08/2016   PLT 226 04/08/2016   Lab Results  Component Value Date   NA 143 04/08/2016   K 4.2 04/08/2016   CHLORIDE 106 04/08/2016   CO2 30 (H) 04/08/2016   GLUCOSE 87 04/08/2016   BUN 19.5 04/08/2016   CREATININE 0.9 04/08/2016   BILITOT 0.61 04/08/2016   ALKPHOS 64 04/08/2016   AST 21 04/08/2016   ALT 20 04/08/2016   PROT 7.1 04/08/2016   ALBUMIN 4.3 04/08/2016   CALCIUM 10.2 04/08/2016   ANIONGAP 7 04/08/2016   EGFR 79 (L) 04/08/2016   GFR 84.61 10/03/2011   Lab Results  Component Value Date   CHOL 152 11/21/2015   Lab Results  Component Value Date   HDL 73.30 11/21/2015   Lab Results  Component Value Date   LDLCALC 51 11/21/2015   Lab Results  Component Value Date   TRIG 134.0 11/21/2015   Lab Results  Component Value Date   CHOLHDL 2 11/21/2015   No results found for: HGBA1C       Assessment & Plan:   Problem List Items Addressed This Visit    Allergic state    Uses Claritin year round and adds Singulair this time of year with good results. Can use flonase nasal saline and increase Claritin to bid as needed      Relevant Medications   montelukast (SINGULAIR) 10 MG tablet   Preventative health care    Patient encouraged to maintain heart healthy diet, regular exercise, adequate sleep. Consider daily probiotics. Take medications as prescribed, labs ordered.       Relevant Orders   CBC   Comprehensive metabolic panel   Lipid panel   TSH   Breast cancer of upper-inner quadrant of left female breast ([facility])    Has been released by cancer center and is doing well.  I have changed Ms. Logsdon's montelukast. I am also having her maintain her zolpidem,  anastrozole, calcium citrate, ferrous sulfate, and loratadine. We will continue to administer sodium chloride.  Meds ordered this encounter  Medications  . montelukast (SINGULAIR) 10 MG tablet    Sig: Take 1 tablet (10 mg total) by mouth at bedtime as needed.    Dispense:  30 tablet    Refill:  5    CMA served as scribe during this visit. History, Physical and Plan performed by medical provider. Documentation and orders reviewed and attested to.  Penni Homans, MD

## 2016-12-11 MED FILL — ZOLPIDEM TARTRATE 10 MG TAB: 10 | 15 days supply | Qty: 15 | Fill #1

## 2017-01-08 DIAGNOSIS — M25511 Pain in right shoulder: Secondary | ICD-10-CM | POA: Diagnosis not present

## 2017-01-14 DIAGNOSIS — M25511 Pain in right shoulder: Secondary | ICD-10-CM | POA: Diagnosis not present

## 2017-01-24 DIAGNOSIS — M19011 Primary osteoarthritis, right shoulder: Secondary | ICD-10-CM | POA: Diagnosis not present

## 2017-01-24 DIAGNOSIS — M7551 Bursitis of right shoulder: Secondary | ICD-10-CM | POA: Diagnosis not present

## 2017-02-13 ENCOUNTER — Ambulatory Visit: Payer: BLUE CROSS/BLUE SHIELD | Admitting: Physical Therapy

## 2017-02-20 MED FILL — ZOLPIDEM TARTRATE 10 MG TAB: 10 | 15 days supply | Qty: 15 | Fill #2

## 2017-03-13 IMAGING — CR DG CHEST 2V
2 series · 2 of 2 positions shown · non-contrast
Comparison: 03/20/2011

CLINICAL DATA: Body aches and fatigue for 2 weeks

EXAM:
CHEST  2 VIEW

[w chest pa]
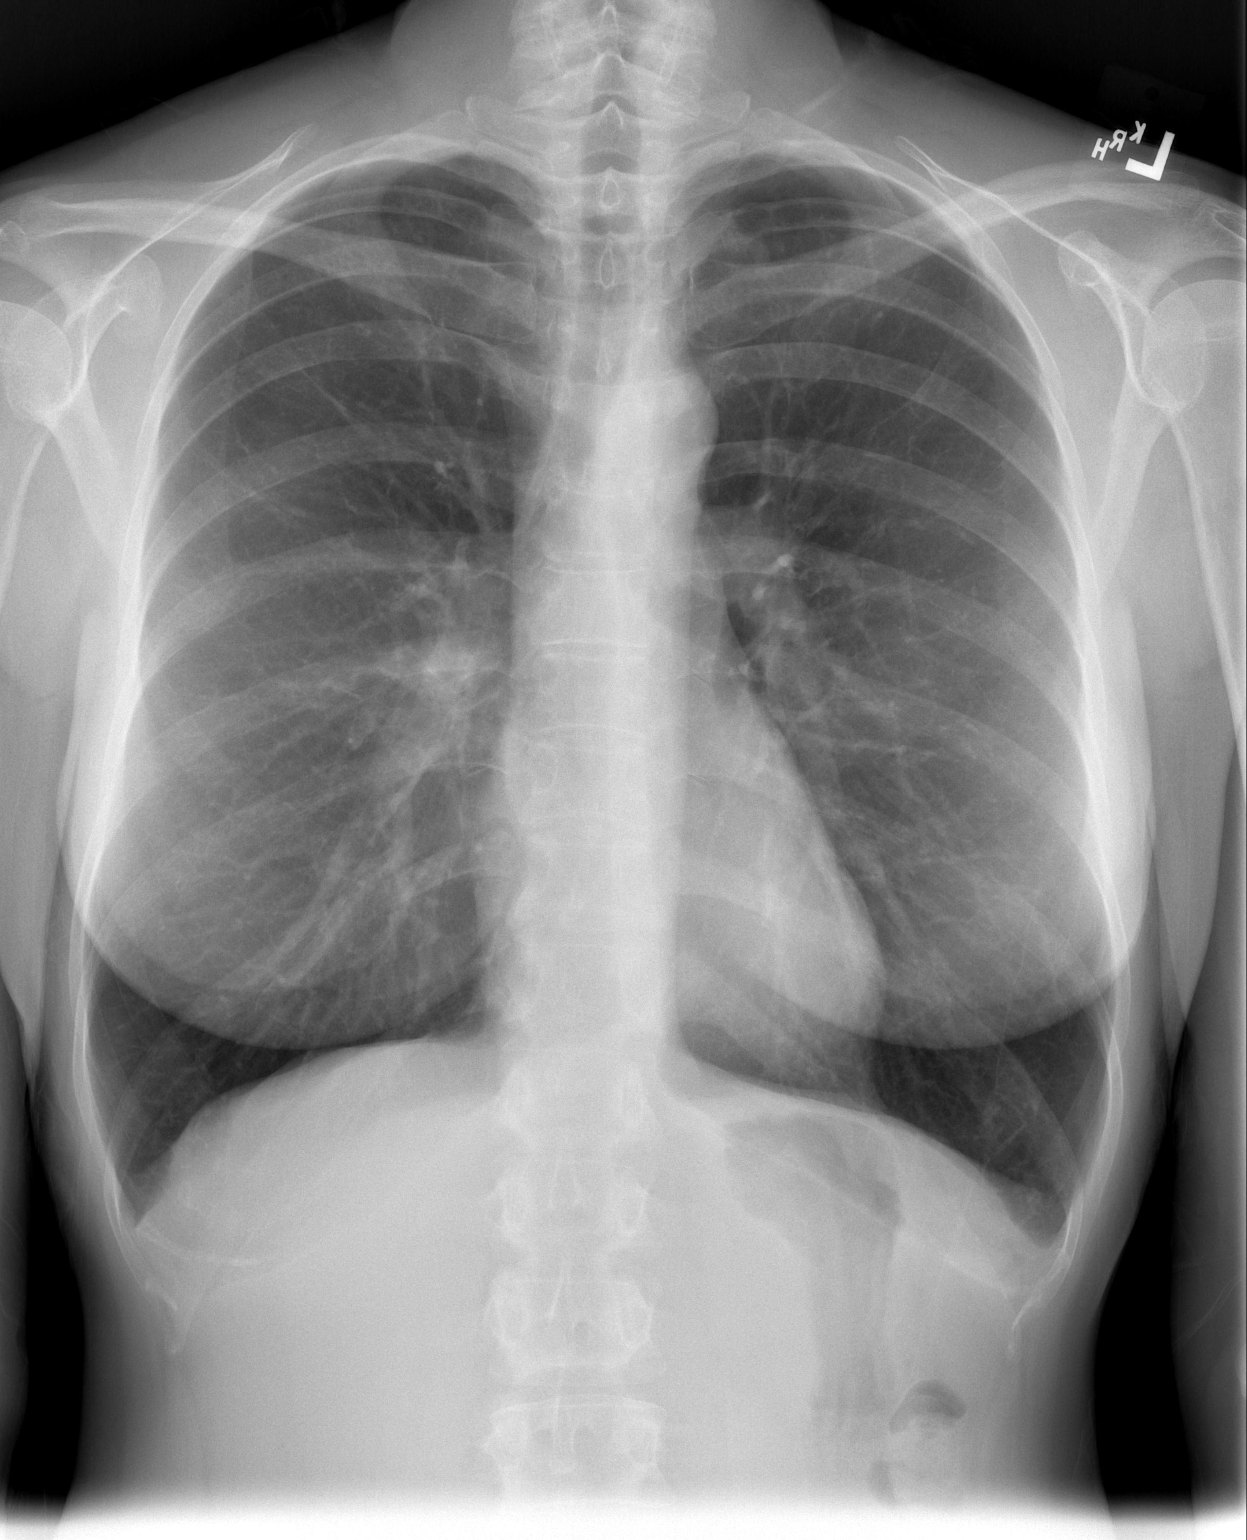

[w chest lat]
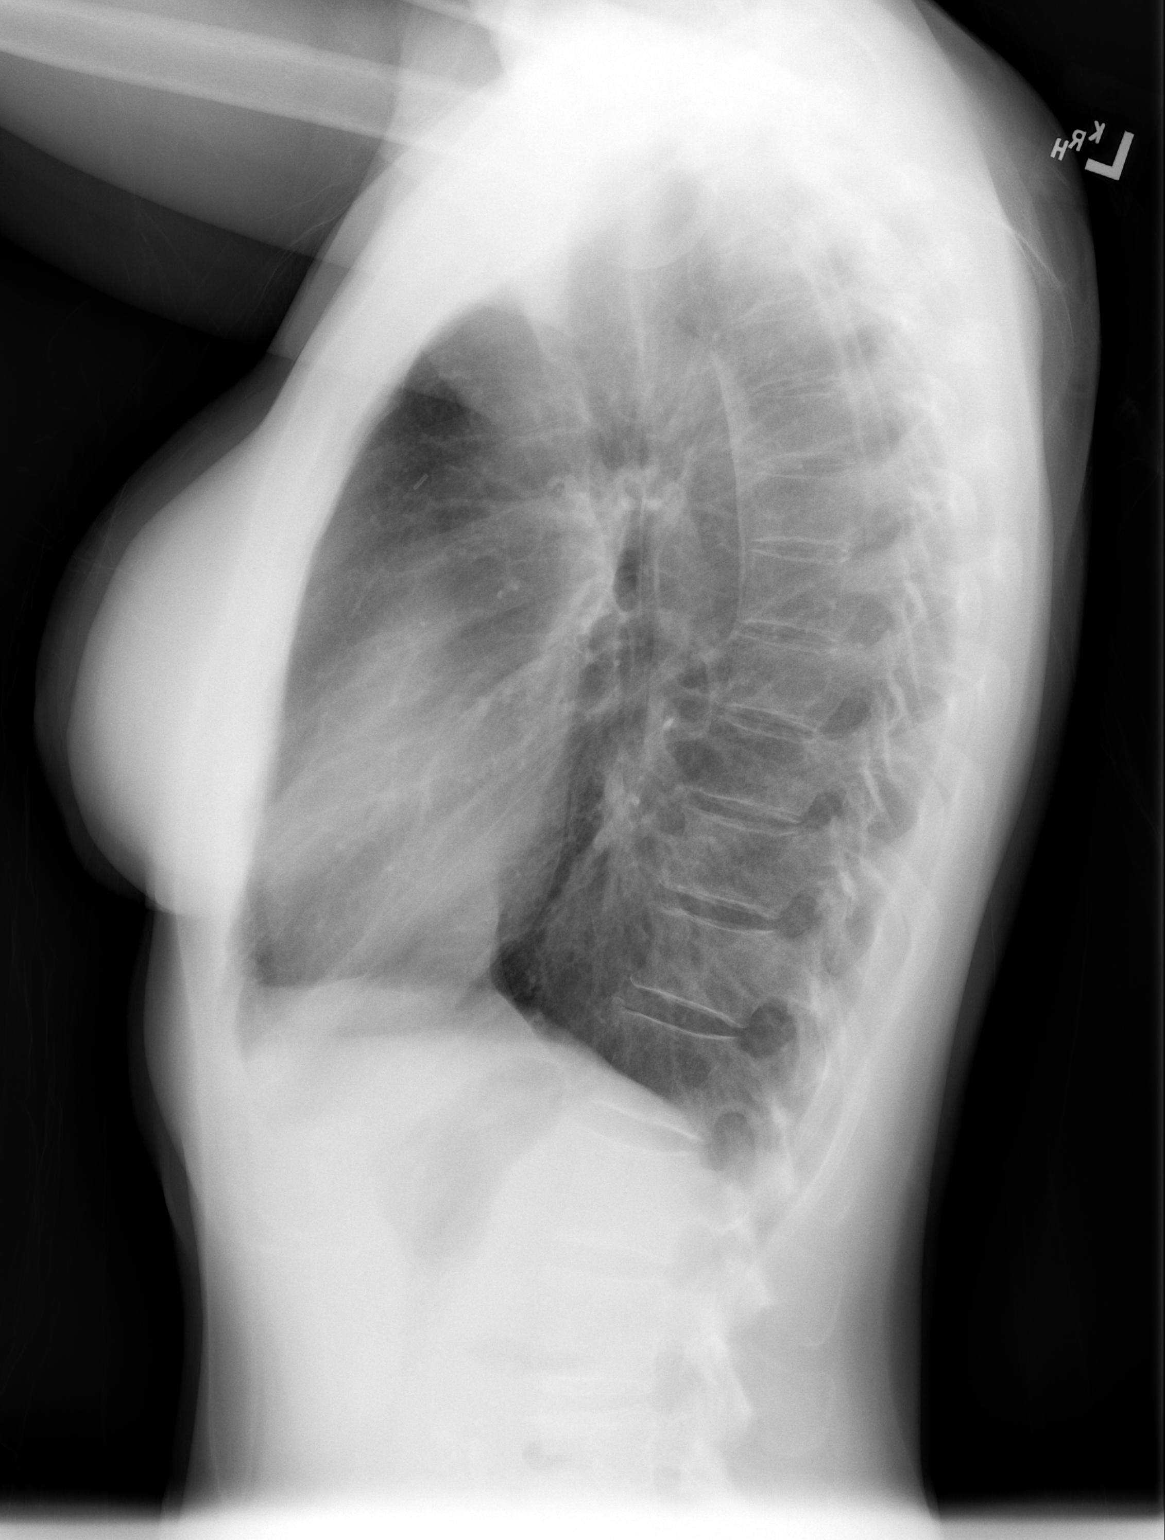

[2 of 2 positions shown; findings below may reference images not displayed]

FINDINGS: The heart size and mediastinal contours are within normal limits.
Both lungs are clear. The visualized skeletal structures are
unremarkable.
IMPRESSION: No active cardiopulmonary disease.

## 2017-03-26 ENCOUNTER — Ambulatory Visit (INDEPENDENT_AMBULATORY_CARE_PROVIDER_SITE_OTHER): Payer: BLUE CROSS/BLUE SHIELD | Admitting: Family Medicine

## 2017-03-26 ENCOUNTER — Encounter: Payer: Self-pay | Admitting: Family Medicine

## 2017-03-26 VITALS — BP 98/66 | HR 87 | Temp 99.6°F | Ht 66.0 in | Wt 143.0 lb

## 2017-03-26 DIAGNOSIS — J309 Allergic rhinitis, unspecified: Secondary | ICD-10-CM

## 2017-03-26 DIAGNOSIS — R69 Illness, unspecified: Secondary | ICD-10-CM

## 2017-03-26 DIAGNOSIS — J111 Influenza due to unidentified influenza virus with other respiratory manifestations: Secondary | ICD-10-CM

## 2017-03-26 MED ORDER — METHYLPREDNISOLONE ACETATE 80 MG/ML IJ SUSP
80.0000 mg | Freq: Once | INTRAMUSCULAR | Status: AC
  Administered 2017-03-26: 80 mg via INTRAMUSCULAR

## 2017-03-26 MED ORDER — OSELTAMIVIR PHOSPHATE 75 MG PO CAPS: 75.0000 mg | ORAL_CAPSULE | Freq: Two times a day (BID) | ORAL | 0 refills | Status: AC

## 2017-03-26 MED ORDER — LEVOCETIRIZINE DIHYDROCHLORIDE 5 MG PO TABS: 5.0000 mg | ORAL_TABLET | Freq: Every evening | ORAL | 11 refills | Status: DC

## 2017-03-26 MED FILL — LEVOCETIRIZINE 5 MG TABLET: 5 | 30 days supply | Qty: 30 | Fill #0

## 2017-03-26 MED FILL — OSELTAMIVIR PHOSPHATE 75 MG: 75 | 5 days supply | Qty: 10 | Fill #0

## 2017-03-26 NOTE — Progress Notes (Signed)
Chief Complaint  Patient presents with  . Cough  . Generalized Body Aches  . Headache  . Sore Throat    Abigail [name] here for URI complaints.  Duration: 1 week; fevers started yesterday Associated symptoms: sinus congestion, rhinorrhea, itchy watery eyes, ear fullness and myalgia Denies: sinus pain, ear pain, ear drainage, sore throat, wheezing, shortness of breath and cough Treatment to date: Claritin, INCS, Singulair Sick contacts: Yes  ROS:  Const: +fevers HEENT: As noted in HPI Lungs: No SOB  Past Medical History:  Diagnosis Date  . Allergic state 12/04/2010  . Allergy    seasonal- spring  . Anemia    mild  . Anxiety 02/11/2011  . BRCA gene mutation positive 11/21/2015  . Breast cancer ([facility]) 06/05/11   RIGHT  BX,POSTERIOR ,SLIGHT LATERAL=FOCAL ATYPIA LOBULAR HYPERPLASIA IN BACKGROUND OF EXTENSIVE STROMAL FIBROSIS  . 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 Chicken pox as a child  . History of mononucleosis   . History of streptococcal pharyngitis   . Hot flashes   . Insomnia 02/11/2011  . Post-operative nausea and vomiting    past c-section  . Preventative health care 12/04/2010  . S/P chemotherapy, time since less than 4 weeks 05/23/2011   completed 4 doses docetaxel/cyclophosphamide  . Urticaria   . UTI (lower urinary tract infection) 11/28/2011  . Vaginitis 10/08/2011   Family History  Adopted: Yes  Problem Relation Age of Onset  . Heart disease Mother   . Heart disease Maternal Uncle   . Other Maternal Grandmother        mad cow disease  . Cancer Other        prostate cancer, diffuse in uncles, GF    BP 98/66 (BP Location: Left Arm, Patient Position: Sitting, Cuff Size: Normal)   Pulse 87   Temp 99.6 F (37.6 C) (Oral)   Ht 5' 6" (1.676 m)   Wt 143 lb (64.9 kg)   LMP 03/02/2011   SpO2 99%   BMI 23.08 kg/m  General: Awake, alert, appears stated age HEENT: AT, [place], ears patent b/l and TM's neg, nares  patent w/o discharge, turbinates enlarged, pharynx pink and without exudates, MMM Neck: No masses or asymmetry Heart: RRR Lungs: CTAB, no accessory muscle use Psych: Age appropriate judgment and insight, normal mood and affect  Allergic rhinitis, unspecified seasonality, unspecified trigger - Plan: levocetirizine (XYZAL) 5 MG tablet, methylPREDNISolone acetate (DEPO-MEDROL) injection 80 mg  Influenza-like illness - Plan: oseltamivir (TAMIFLU) 75 MG capsule  Orders as above. Given fever starting within 48 hrs, will tx for flu.  Most of issues starting 1 week ago sound related to allergies. Change PO antihistamine, steroid shot. Cont Singulair and INCS.  Continue to push fluids, practice good hand hygiene, cover mouth when coughing.  Letter for work given.  F/u prn. If starting to experience increasing fevers, shaking, or shortness of breath, seek immediate care. Pt voiced understanding and agreement to the plan.  Nicholas Paul Wendling, DO 03/26/17 4:15 PM  

## 2017-03-26 NOTE — Patient Instructions (Addendum)
Continue to push fluids, practice good hand hygiene, and cover your mouth if you cough.  If you start having increasing fevers, shaking or shortness of breath, seek immediate care.  OK to take Tylenol 1000 mg (2 extra strength tabs) or 975 mg (3 regular strength tabs) every 6 hours as needed.  Ibuprofen 400-600 mg (2-3 over the counter strength tabs) every 6 hours as needed for pain.  Stop Claritin.  Let us know if you need anything.

## 2017-03-26 NOTE — Progress Notes (Signed)
Pre visit review using our clinic review tool, if applicable. No additional management support is needed unless otherwise documented below in the visit note. 

## 2017-03-28 MED FILL — MONTELUKAST SOD 10 MG TAB: 10 | 30 days supply | Qty: 30 | Fill #0

## 2017-03-31 ENCOUNTER — Other Ambulatory Visit: Payer: Self-pay

## 2017-03-31 ENCOUNTER — Ambulatory Visit: Payer: BLUE CROSS/BLUE SHIELD | Attending: Orthopedic Surgery | Admitting: Physical Therapy

## 2017-03-31 DIAGNOSIS — M25611 Stiffness of right shoulder, not elsewhere classified: Secondary | ICD-10-CM | POA: Insufficient documentation

## 2017-03-31 DIAGNOSIS — R293 Abnormal posture: Secondary | ICD-10-CM | POA: Insufficient documentation

## 2017-03-31 DIAGNOSIS — M25511 Pain in right shoulder: Secondary | ICD-10-CM

## 2017-03-31 DIAGNOSIS — M6281 Muscle weakness (generalized): Secondary | ICD-10-CM | POA: Diagnosis not present

## 2017-04-01 NOTE — Therapy (Signed)
[facility] [address]  [place], [place], [address] Phone: [phone]   Fax:  [phone]  Physical Therapy Evaluation  Patient Details  Name: Abigail [name] MRN: [id number] Date of Birth: 1965/06/20 Referring Provider: Wyatt Portela, PA-C   Encounter Date: 03/31/2017  PT End of Session - 03/31/17 1524    Visit Number  1    Number of Visits  12    Date for PT Re-Evaluation  05/16/17    Authorization Type  BCBS    PT Start Time  1524    PT Stop Time  1612    PT Time Calculation (min)  48 min    Activity Tolerance  Patient tolerated treatment well;Patient limited by pain    Behavior During Therapy  [facility] for tasks assessed/performed       Past Medical History:  Diagnosis Date  . Allergic state 12/04/2010  . Allergy    seasonal- spring  . Anemia    mild  . Anxiety 02/11/2011  . BRCA gene mutation positive 11/21/2015  . Breast cancer ([place]) 06/05/11   RIGHT  BX,POSTERIOR ,SLIGHT LATERAL=FOCAL ATYPIA LOBULAR HYPERPLASIA IN BACKGROUND OF EXTENSIVE STROMAL FIBROSIS  . 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 Chicken pox as a child  . History of mononucleosis   . History of streptococcal pharyngitis   . Hot flashes   . Insomnia 02/11/2011  . Post-operative nausea and vomiting    past c-section  . Preventative health care 12/04/2010  . S/P chemotherapy, time since less than 4 weeks 05/23/2011   completed 4 doses docetaxel/cyclophosphamide  . Urticaria   . UTI (lower urinary tract infection) 11/28/2011  . Vaginitis 10/08/2011    Past Surgical History:  Procedure Laterality Date  . BREAST RECONSTRUCTION Bilateral 03/24/2012   Procedure: BREAST RECONSTRUCTION WITH PLACEMENT OF SILICONE GEL IMPLANTS;  Surgeon: Crissie Reese, MD;  Location: [place];  Service: Plastics;  Laterality: Bilateral;  . [place]   X 1  . MASTECTOMY W/ SENTINEL NODE BIOPSY  08/07/2011    Procedure: MASTECTOMY WITH SENTINEL LYMPH NODE BIOPSY;  Surgeon: Merrie Roof, MD;  Location: [place];  Service: General;  Laterality: Bilateral;  bilateral mastectomy  . MOUTH SURGERY  1997   gum surgery  . PORT-A-CATH REMOVAL  08/07/2011   Procedure: REMOVAL PORT-A-CATH;  Surgeon: Merrie Roof, MD;  Location: [place];  Service: General;  Laterality: Right;  . PORTACATH PLACEMENT  03/20/2011   Procedure: INSERTION PORT-A-CATH;  Surgeon: Merrie Roof, MD;  Location: [place];  Service: General;  Laterality: Right;  placement of port -Right Subclavian Vein  . TISSUE EXPANDER PLACEMENT  08/07/2011   Procedure: TISSUE EXPANDER;  Surgeon: Crissie Reese, MD;  Location: [place];  Service: Plastics;  Laterality: Bilateral;  BILATERAL PLACEMENT OF TISSUE EXPANDERS FOR BREAST RECONSTRUCTION  . TUBAL LIGATION     1999  . VARICOSE VEIN SURGERY     [facility]    There were no vitals filed for this visit.   Subjective Assessment - 03/31/17 1525    Subjective  Pt reports sore shoulder since Sept 2017. Started while playing volleyball. Tried resting the shoulder until Dec but still having pain. During the same time period she was having to provide total care for her elderly mother following a fall. Saw MD and imaging revealed OA as well as fraying of her labrum  in the R shoulder. Received injection with some relief, esp allowing her to guard less with shoulder, but feels that it is starting to wear off.    Limitations  House hold activities    Patient Stated Goals  "to have normal movement and use of R UE"    Currently in Pain?  Yes    Pain Score  1  up to 0-8/65 with certain movements    Pain Location  Shoulder    Pain Orientation  Right    Pain Descriptors / Indicators  Discomfort;Sharp;Guarding    Pain Type  Acute pain    Pain Radiating Towards  down lateral arm and biceps after certain movements (lasting for several hours)    Pain Onset  More than a month ago Sept  2017    Pain Frequency  Intermittent    Aggravating Factors   any movements where she horizontally abducts her arm (putting arm in sleeve, reaching seatbelt or to passenger seat in car)    Pain Relieving Factors  sling, Ibuprofen, ice    Effect of Pain on Daily Activities  limited use of R UE with ADLs (dressing) and household chores         [facility] PT Assessment - 03/31/17 1524      Assessment   Medical Diagnosis  R shoulder OA & bursitis    Referring Provider  Wyatt Portela, PA-C    Onset Date/Surgical Date  -- Sept 2018    Next MD Visit  to be scheduled      Balance Screen   Has the patient fallen in the past 6 months  No    Has the patient had a decrease in activity level because of a fear of falling?   No    Is the patient reluctant to leave their home because of a fear of falling?   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    Leisure  volleyball, likes to be active - sports in general; projects around the house      Observation/Other Assessments   Focus on Therapeutic Outcomes (FOTO)   Shoulder - 55% (45% limitation); predicted 70% (30% limitation)      Posture/Postural Control   Posture/Postural Control  Postural limitations    Postural Limitations  Forward head;Rounded Shoulders      ROM / Strength   AROM / PROM / Strength  AROM;PROM;Strength      AROM   AROM Assessment Site  Shoulder    Right/Left Shoulder  Right;Left    Right Shoulder Flexion  128 Degrees pain starting at ~105 dg    Right Shoulder ABduction  122 Degrees pain starting at ~85 dg    Right Shoulder Internal Rotation  -- FIR to L5    Right Shoulder External Rotation  -- FER to T3    Left Shoulder Flexion  158 Degrees    Left Shoulder ABduction  154 Degrees    Left Shoulder Internal Rotation  -- FIR to T7    Left  Shoulder External Rotation  -- FER to T4      PROM   Overall PROM Comments  guarded - PROM <  AROM    PROM Assessment Site  Shoulder    Right/Left Shoulder  Right      Strength   Strength Assessment Site  Shoulder    Right/Left Shoulder  Right;Left    Right Shoulder Flexion  4/5    Right Shoulder ABduction  4-/5    Right Shoulder Internal Rotation  4-/5    Right Shoulder External Rotation  4-/5    Left Shoulder Flexion  4+/5    Left Shoulder ABduction  4+/5    Left Shoulder Internal Rotation  4+/5    Left Shoulder External Rotation  4+/5      Palpation   Palpation comment  tight & ttp over R anterior & middle deltoids, B pecs             Objective measurements completed on examination: See above findings.      [facility] Adult PT Treatment/Exercise - 03/31/17 1524      Self-Care   Self-Care  Posture    Posture  Provided initial education in neutral spine and shoulder posture to promote imporved GH mechanics.      Exercises   Exercises  Shoulder      Shoulder Exercises: Standing   Row  Both;10 reps;Theraband;Strengthening    Theraband Level (Shoulder Row)  Level 1 (Yellow)    Row Limitations  emphasis on scapular retraction & depression with minimal UE movement    Retraction  Both;10 reps;Theraband;Strengthening    Theraband Level (Shoulder Retraction)  Level 1 (Yellow)    Retraction Limitations  + mini shoulder extension      Shoulder Exercises: Stretch   Corner Stretch Limitations  low doorway stretch attempted but deferred d/t too painful on R             PT Education - 03/31/17 1610    Education provided  Yes    Education Details  PT eval findings, anticipated POC, initial HEP    Person(s) Educated  Patient    Methods  Explanation;Demonstration;Handout    Comprehension  Verbalized understanding;Returned demonstration       PT Short Term Goals - 03/31/17 1612      PT SHORT TERM GOAL #1   Title  Independent with initial HEP    Status  New     Target Date  04/21/17      PT SHORT TERM GOAL #2   Title  Pt will verbalize/demonstrate understanding of neutral spine and shoulder posture for imporved GH mechanics    Status  New    Target Date  04/21/17        PT Long Term Goals - 03/31/17 1612      PT LONG TERM GOAL #1   Title  Independent with ongoing/advanced HEP    Status  New    Target Date  05/16/17      PT LONG TERM GOAL #2   Title  R shoulder AROM w/in 10 degrees of L w/o increased pain    Status  New    Target Date  05/16/17      PT LONG TERM GOAL #3   Title  R shoulder strength >/= 4/5 to 4+/5 w/o increased pain on resistance    Status  New    Target Date  05/16/17      PT LONG TERM GOAL #4   Title  Pt will report ability to perform ADLs such as dressing and light household chores with >/= 50% reduction in pain    Status  New    Target Date  05/16/17  Plan - 03/31/17 1612    Clinical Impression Statement  Shelton is a 52 y/o female who presents to OP PT with R shoulder pain of ~6 months duration. PT reports imaging revealed OA in the R shoulder as well as fraying of her labrum, with referring diagnosis also including bursitis. Pt reports minimal resting pain, but pain up to 6-7/10 with motions into extension and horizontal abduction which often lingers for several hours following trigger. She demonstrates forward head and rounded shoulder posture leading to increased muscle tension and ttp over L B pecs and deltoids. Postural dysfunction and muscular tightness contributing to deficits in flexion, abduction & IR AROM as well as R shoulder weakness. Pt will benefit from skilled PT to address deficits listed to restore functional use of R UE.    History and Personal Factors relevant to plan of care:  h/o breast cancer s/p B mastectomies and reconstruction contributing to forward/rounded shoulder posture    Clinical Presentation  Evolving    Clinical Presentation due to:  worsening of pain with injection  wearing off; limited tolerance for initial therapy; complex medical history    Clinical Decision Making  Moderate    Rehab Potential  Good    PT Frequency  2x / week    PT Duration  6 weeks    PT Treatment/Interventions  Patient/family education;Neuromuscular re-education;ADLs/Self Care Home Management;Therapeutic exercise;Therapeutic activities;Manual techniques;Passive range of motion;Dry needling;Taping;Iontophoresis 19m/ml Dexamethasone;Moist Heat;Cryotherapy;Vasopneumatic Device    Consulted and Agree with Plan of Care  Patient       Patient will benefit from skilled therapeutic intervention in order to improve the following deficits and impairments:  Pain, Postural dysfunction, Decreased range of motion, Impaired flexibility, Decreased strength, Increased muscle spasms, Impaired UE functional use, Decreased activity tolerance  Visit Diagnosis: Acute pain of right shoulder - Plan: PT plan of care cert/re-cert  Stiffness of right shoulder, not elsewhere classified - Plan: PT plan of care cert/re-cert  Abnormal posture - Plan: PT plan of care cert/re-cert  Muscle weakness (generalized) - Plan: PT plan of care cert/re-cert     Problem List Patient Active Problem List   Diagnosis Date Noted  . Colon cancer screening 11/26/2015  . BRCA gene mutation positive 11/21/2015  . Osteopenia 05/24/2013  . Breast cancer of upper-inner quadrant of left female breast ([place] 11/18/2012  . Vaginal dryness 12/03/2011  . S/P mastectomy, bilateral 12/03/2011  . Menopause 12/03/2011  . Anemia 12/03/2011  . UTI (lower urinary tract infection) 11/28/2011  . Anxiety 02/11/2011  . Insomnia 02/11/2011  . Allergic state 12/04/2010  . Preventative health care 12/04/2010  . History of mononucleosis   . DERMATITIS, ATOPIC 11/17/2010    JPercival Spanish PT, MPT 04/01/2017, 10:04 AM  [facility] [place]  [address]Phone: [phone]  Fax:  [phone] Name: Abigail BEEDLEMRN: [id number] of Birth: 1Apr 28, 1967

## 2017-04-02 MED FILL — ZOLPIDEM TARTRATE 10 MG TAB: 10 | 25 days supply | Qty: 15 | Fill #0

## 2017-04-04 ENCOUNTER — Ambulatory Visit: Payer: BLUE CROSS/BLUE SHIELD

## 2017-04-04 DIAGNOSIS — M25511 Pain in right shoulder: Secondary | ICD-10-CM | POA: Diagnosis not present

## 2017-04-04 DIAGNOSIS — M6281 Muscle weakness (generalized): Secondary | ICD-10-CM | POA: Diagnosis not present

## 2017-04-04 DIAGNOSIS — R293 Abnormal posture: Secondary | ICD-10-CM | POA: Diagnosis not present

## 2017-04-04 DIAGNOSIS — M25611 Stiffness of right shoulder, not elsewhere classified: Secondary | ICD-10-CM | POA: Diagnosis not present

## 2017-04-04 NOTE — Therapy (Signed)
[facility] [address]  [place], [place], [address] Phone: [phone]   Fax:  [phone]  Physical Therapy Treatment  Patient Details  Name: Abigail [name] MRN: [id number] Date of Birth: [birth year] Referring Provider: Wyatt Portela, PA-C   Encounter Date: 04/04/2017  PT End of Session - 04/04/17 0851    Visit Number  2    Number of Visits  12    Date for PT Re-Evaluation  05/16/17    Authorization Type  BCBS    PT Start Time  0846    PT Stop Time  0929    PT Time Calculation (min)  43 min    Activity Tolerance  Patient tolerated treatment well    Behavior During Therapy  [facility] for tasks assessed/performed       Past Medical History:  Diagnosis Date  . Allergic state 12/04/2010  . Allergy    seasonal- spring  . Anemia    mild  . Anxiety 02/11/2011  . BRCA gene mutation positive 11/21/2015  . Breast cancer ([place]) 06/05/11   RIGHT  BX,POSTERIOR ,SLIGHT LATERAL=FOCAL ATYPIA LOBULAR HYPERPLASIA IN BACKGROUND OF EXTENSIVE STROMAL FIBROSIS  . 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 Chicken pox as a child  . History of mononucleosis   . History of streptococcal pharyngitis   . Hot flashes   . Insomnia 02/11/2011  . Post-operative nausea and vomiting    past c-section  . Preventative health care 12/04/2010  . S/P chemotherapy, time since less than 4 weeks 05/23/2011   completed 4 doses docetaxel/cyclophosphamide  . Urticaria   . UTI (lower urinary tract infection) 11/28/2011  . Vaginitis 10/08/2011    Past Surgical History:  Procedure Laterality Date  . BREAST RECONSTRUCTION Bilateral 03/24/2012   Procedure: BREAST RECONSTRUCTION WITH PLACEMENT OF SILICONE GEL IMPLANTS;  Surgeon: Crissie Reese, MD;  Location: [place];  Service: Plastics;  Laterality: Bilateral;  . [place]   X 1  . MASTECTOMY W/ SENTINEL NODE BIOPSY  08/07/2011   Procedure: MASTECTOMY  WITH SENTINEL LYMPH NODE BIOPSY;  Surgeon: Merrie Roof, MD;  Location: [place];  Service: General;  Laterality: Bilateral;  bilateral mastectomy  . MOUTH SURGERY  1997   gum surgery  . PORT-A-CATH REMOVAL  08/07/2011   Procedure: REMOVAL PORT-A-CATH;  Surgeon: Merrie Roof, MD;  Location: [place];  Service: General;  Laterality: Right;  . PORTACATH PLACEMENT  03/20/2011   Procedure: INSERTION PORT-A-CATH;  Surgeon: Merrie Roof, MD;  Location: [place];  Service: General;  Laterality: Right;  placement of port -Right Subclavian Vein  . TISSUE EXPANDER PLACEMENT  08/07/2011   Procedure: TISSUE EXPANDER;  Surgeon: Crissie Reese, MD;  Location: [place];  Service: Plastics;  Laterality: Bilateral;  BILATERAL PLACEMENT OF TISSUE EXPANDERS FOR BREAST RECONSTRUCTION  . TUBAL LIGATION     1999  . VARICOSE VEIN SURGERY     [facility]    There were no vitals filed for this visit.  Subjective Assessment - 04/04/17 0849    Subjective  Pt. reporting R shoulder with some     Patient Stated Goals  "to have normal movement and use of R UE"    Currently in Pain?  No/denies    Pain Score  0-No pain    Multiple Pain Sites  No  [facility] Adult PT Treatment/Exercise - 04/04/17 0856      Shoulder Exercises: Supine   External Rotation  Right;AAROM;10 reps      Shoulder Exercises: Seated   External Rotation  Both;10 reps;Strengthening;Theraband    Theraband Level (Shoulder External Rotation)  Level 1 (Yellow)      Shoulder Exercises: Standing   Internal Rotation  AAROM;10 reps    Internal Rotation Limitations  wand - behind back    Row  Both;Theraband;Strengthening;15 reps    Theraband Level (Shoulder Row)  Level 1 (Yellow)    Retraction  Both;Theraband;Strengthening;15 reps    Theraband Level (Shoulder Retraction)  Level 1 (Yellow)      Shoulder Exercises: ROM/Strengthening   UBE (Upper Arm Bike)  UBE: lvl 1.5, 3 min each way      Cybex Row  10 reps    Cybex Row Limitations  10# - low handles       Shoulder Exercises: IT sales professional Limitations  low doorway stretch attempted but deferred d/t too painful on R    Internal Rotation Stretch  5 reps with towel - 10 sec hold    Other Shoulder Stretches  Hooklying chest strech laying on pool noodle in low UE range x 2 min       Manual Therapy   Manual Therapy  Soft tissue mobilization    Manual therapy comments  Hooklying     Soft tissue mobilization  STM/overpressure into B pec stretch laying on pool noodle              PT Education - 04/04/17 1215    Education provided  Yes    Education Details  Towel internal rotation stretch for shoulder     Person(s) Educated  Patient    Methods  Explanation;Demonstration;Verbal cues;Handout    Comprehension  Verbalized understanding;Returned demonstration;Verbal cues required;Need further instruction       PT Short Term Goals - 04/04/17 9476      PT SHORT TERM GOAL #1   Title  Independent with initial HEP    Status  On-going      PT SHORT TERM GOAL #2   Title  Pt will verbalize/demonstrate understanding of neutral spine and shoulder posture for imporved GH mechanics    Status  On-going        PT Long Term Goals - 04/04/17 5465      PT LONG TERM GOAL #1   Title  Independent with ongoing/advanced HEP    Status  On-going      PT LONG TERM GOAL #2   Title  R shoulder AROM w/in 10 degrees of L w/o increased pain    Status  On-going      PT LONG TERM GOAL #3   Title  R shoulder strength >/= 4/5 to 4+/5 w/o increased pain on resistance    Status  On-going      PT LONG TERM GOAL #4   Title  Pt will report ability to perform ADLs such as dressing and light household chores with >/= 50% reduction in pain    Status  On-going            Plan - 04/04/17 0853    Clinical Impression Statement  Abigail [name] doing well today.  Tolerated all gentle chest stretching and scapular strengthening activities  well today.  Ended treatment pain free.  Towel internal rotation stretch added to HEP today.  Will continue to progress toward goals.      PT Treatment/Interventions  Patient/family education;Neuromuscular re-education;ADLs/Self Care Home Management;Therapeutic exercise;Therapeutic activities;Manual techniques;Passive range of motion;Dry needling;Taping;Iontophoresis 37m/ml Dexamethasone;Moist Heat;Cryotherapy;Vasopneumatic Device    Consulted and Agree with Plan of Care  Patient       Patient will benefit from skilled therapeutic intervention in order to improve the following deficits and impairments:  Pain, Postural dysfunction, Decreased range of motion, Impaired flexibility, Decreased strength, Increased muscle spasms, Impaired UE functional use, Decreased activity tolerance  Visit Diagnosis: Acute pain of right shoulder  Stiffness of right shoulder, not elsewhere classified  Abnormal posture  Muscle weakness (generalized)     Problem List Patient Active Problem List   Diagnosis Date Noted  . Colon cancer screening 11/26/2015  . BRCA gene mutation positive 11/21/2015  . Osteopenia 05/24/2013  . Breast cancer of upper-inner quadrant of left female breast ([place] 11/18/2012  . Vaginal dryness 12/03/2011  . S/P mastectomy, bilateral 12/03/2011  . Menopause 12/03/2011  . Anemia 12/03/2011  . UTI (lower urinary tract infection) 11/28/2011  . Anxiety 02/11/2011  . Insomnia 02/11/2011  . Allergic state 12/04/2010  . Preventative health care 12/04/2010  . History of mononucleosis   . DERMATITIS, ATOPIC 11/17/2010   MBess Harvest PTA 04/04/17 12:16 PM  [facility] [address] [place] [address]Phone: [phone]  Fax:  [phone] Name: Abigail MARSEILLEMRN: [id number] of Birth: [birth year]

## 2017-04-11 ENCOUNTER — Ambulatory Visit: Payer: BLUE CROSS/BLUE SHIELD | Admitting: Physical Therapy

## 2017-04-11 ENCOUNTER — Encounter: Payer: Self-pay | Admitting: Physical Therapy

## 2017-04-11 DIAGNOSIS — M25611 Stiffness of right shoulder, not elsewhere classified: Secondary | ICD-10-CM | POA: Diagnosis not present

## 2017-04-11 DIAGNOSIS — M6281 Muscle weakness (generalized): Secondary | ICD-10-CM | POA: Diagnosis not present

## 2017-04-11 DIAGNOSIS — M25511 Pain in right shoulder: Secondary | ICD-10-CM | POA: Diagnosis not present

## 2017-04-11 DIAGNOSIS — R293 Abnormal posture: Secondary | ICD-10-CM

## 2017-04-11 NOTE — Therapy (Signed)
[facility] [address]  [place], [place], [address] Phone: [phone]   Fax:  [phone]  Physical Therapy Treatment  Patient Details  Name: Abigail [name] MRN: [id number] Date of Birth: 27-Jun-1965 Referring Provider: Wyatt Portela, PA-C   Encounter Date: 04/11/2017  PT End of Session - 04/11/17 0930    Visit Number  3    Number of Visits  12    Date for PT Re-Evaluation  05/16/17    Authorization Type  BCBS    PT Start Time  0930    PT Stop Time  1019    PT Time Calculation (min)  49 min    Activity Tolerance  Patient tolerated treatment well    Behavior During Therapy  [name] for tasks assessed/performed       Past Medical History:  Diagnosis Date  . Allergic state 12/04/2010  . Allergy    seasonal- spring  . Anemia    mild  . Anxiety 02/11/2011  . BRCA gene mutation positive 11/21/2015  . Breast cancer ([place]) 06/05/11   RIGHT  BX,POSTERIOR ,SLIGHT LATERAL=FOCAL ATYPIA LOBULAR HYPERPLASIA IN BACKGROUND OF EXTENSIVE STROMAL FIBROSIS  . 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 Chicken pox as a child  . History of mononucleosis   . History of streptococcal pharyngitis   . Hot flashes   . Insomnia 02/11/2011  . Post-operative nausea and vomiting    past c-section  . Preventative health care 12/04/2010  . S/P chemotherapy, time since less than 4 weeks 05/23/2011   completed 4 doses docetaxel/cyclophosphamide  . Urticaria   . UTI (lower urinary tract infection) 11/28/2011  . Vaginitis 10/08/2011    Past Surgical History:  Procedure Laterality Date  . BREAST RECONSTRUCTION Bilateral 03/24/2012   Procedure: BREAST RECONSTRUCTION WITH PLACEMENT OF SILICONE GEL IMPLANTS;  Surgeon: Crissie Reese, MD;  Location: [facility];  Service: Plastics;  Laterality: Bilateral;  . [place]   X 1  . MASTECTOMY W/ SENTINEL NODE BIOPSY  08/07/2011   Procedure: MASTECTOMY  WITH SENTINEL LYMPH NODE BIOPSY;  Surgeon: Merrie Roof, MD;  Location: [facility];  Service: General;  Laterality: Bilateral;  bilateral mastectomy  . MOUTH SURGERY  1997   gum surgery  . PORT-A-CATH REMOVAL  08/07/2011   Procedure: REMOVAL PORT-A-CATH;  Surgeon: Merrie Roof, MD;  Location: [facility];  Service: General;  Laterality: Right;  . PORTACATH PLACEMENT  03/20/2011   Procedure: INSERTION PORT-A-CATH;  Surgeon: Merrie Roof, MD;  Location: [facility];  Service: General;  Laterality: Right;  placement of port -Right Subclavian Vein  . TISSUE EXPANDER PLACEMENT  08/07/2011   Procedure: TISSUE EXPANDER;  Surgeon: Crissie Reese, MD;  Location: [facility];  Service: Plastics;  Laterality: Bilateral;  BILATERAL PLACEMENT OF TISSUE EXPANDERS FOR BREAST RECONSTRUCTION  . TUBAL LIGATION     1999  . VARICOSE VEIN SURGERY     [facility]    There were no vitals filed for this visit.  Subjective Assessment - 04/11/17 0933    Subjective  Pt reporting she was traveling on vacation recently and noting several activities that continue to cause pain - lifting suitcase, playing ping-pong, bumping volleyball and laying prone on beach.    Patient Stated Goals  "to have normal movement and use of R UE"    Currently in Pain?  Yes    Pain  Score  1     Pain Location  Shoulder    Pain Orientation  Right    Pain Descriptors / Indicators  Burning    Pain Type  Acute pain                      [facility] Adult PT Treatment/Exercise - 04/11/17 0930      Exercises   Exercises  Shoulder      Shoulder Exercises: Supine   Protraction  Both;10 reps;Weights;Strengthening    Protraction Weight (lbs)  2    Protraction Limitations  wand; hooklying on pool noodle    Horizontal ABduction  Both;10 reps;Theraband;Strengthening    Theraband Level (Shoulder Horizontal ABduction)  Level 2 (Red)    Horizontal ABduction Limitations  hooklying on pool noodle    Flexion  Both;10  reps;Weights;AAROM;Strengthening    Shoulder Flexion Weight (lbs)  2    Flexion Limitations  wand; hooklying on pool noodle      Shoulder Exercises: ROM/Strengthening   UBE (Upper Arm Bike)  L2.0 fwd/back x 3' each    Lat Pull  -- 5 reps    Lat Pull Limitations  10# - emphasis on scap retraction & depression on pull down, AAROM R shoulder flexion on release - no pain on pull down, but noting intermittent ant R shoulder pain with release      Shoulder Exercises: Stretch   Internal Rotation Stretch  20 seconds 3 reps    Internal Rotation Stretch Limitations  sleeper stretch    Other Shoulder Stretches  Hooklying chest stretch on pool noodle (varying arm position as tol) x 2 min     Other Shoulder Stretches  Rhomboid stretch 2x30"      Manual Therapy   Manual Therapy  Soft tissue mobilization;Myofascial release;Other (comment)    Manual therapy comments  hooklying on pool noodle    Soft tissue mobilization  R pecs, ant/mid deltoid    Myofascial Release  TPR R lateral pec major    Other Manual Therapy  Instructed pt in self-STM/MFR with small ball on wall for rhomboids & pecs             PT Education - 04/11/17 1019    Education provided  Yes    Education Details  HEP update - alt IR stretch (sleeper stretch) & hooklying pec stretch over pool noodle    Person(s) Educated  Patient    Methods  Explanation;Demonstration;Handout    Comprehension  Verbalized understanding;Returned demonstration       PT Short Term Goals - 04/04/17 9892      PT SHORT TERM GOAL #1   Title  Independent with initial HEP    Status  On-going      PT SHORT TERM GOAL #2   Title  Pt will verbalize/demonstrate understanding of neutral spine and shoulder posture for imporved GH mechanics    Status  On-going        PT Long Term Goals - 04/04/17 1194      PT LONG TERM GOAL #1   Title  Independent with ongoing/advanced HEP    Status  On-going      PT LONG TERM GOAL #2   Title  R shoulder AROM w/in  10 degrees of L w/o increased pain    Status  On-going      PT LONG TERM GOAL #3   Title  R shoulder strength >/= 4/5 to 4+/5 w/o increased pain on resistance  Status  On-going      PT LONG TERM GOAL #4   Title  Pt will report ability to perform ADLs such as dressing and light household chores with >/= 50% reduction in pain    Status  On-going            Plan - 04/11/17 0937    Clinical Impression Statement  Pt reporting good tolerance for scapular strengthening/stabilization exercises with HEP but limited tolerance for IR towel stretch, therefore provided alternative sleeper stretch for IR with pt reporting better tolerance. Treatment focusing on continued scapular strengthening/stabilization as well as promoting increased R shoulder ROM. Pt requiring close monitoring and cueing to avoid painful motions.    PT Treatment/Interventions  Patient/family education;Neuromuscular re-education;ADLs/Self Care Home Management;Therapeutic exercise;Therapeutic activities;Manual techniques;Passive range of motion;Dry needling;Taping;Iontophoresis 1m/ml Dexamethasone;Moist Heat;Cryotherapy;Vasopneumatic Device    Consulted and Agree with Plan of Care  Patient       Patient will benefit from skilled therapeutic intervention in order to improve the following deficits and impairments:  Pain, Postural dysfunction, Decreased range of motion, Impaired flexibility, Decreased strength, Increased muscle spasms, Impaired UE functional use, Decreased activity tolerance  Visit Diagnosis: Acute pain of right shoulder  Stiffness of right shoulder, not elsewhere classified  Abnormal posture  Muscle weakness (generalized)     Problem List Patient Active Problem List   Diagnosis Date Noted  . Colon cancer screening 11/26/2015  . BRCA gene mutation positive 11/21/2015  . Osteopenia 05/24/2013  . [place]inner [place] 11/18[place]2012  . Vaginal dryness  12/03/2011  . S/P mastectomy, bilateral 12/03/2011  . Menopause 12/03/2011  . Anemia 12/03/2011  . UTI (lower urinary tract infection) 11/28/2011  . Anxiety 02/11/2011  . Insomnia 02/11/2011  . Allergic state 12/04/2010  . Preventative health care 12/04/2010  . History of mononucleosis   . DERMATITIS, ATOPIC 11/17/2010    JPercival Spanish PT, MPT 04/11/2017, 10:50 AM  [facility] [place] [address]Phone: [phone]  Fax:  [phone] Name: CJAKYA DOVIDIOMRN: [id number] of Birth: [birth year]

## 2017-04-16 ENCOUNTER — Ambulatory Visit: Payer: BLUE CROSS/BLUE SHIELD

## 2017-04-16 DIAGNOSIS — M25511 Pain in right shoulder: Secondary | ICD-10-CM | POA: Diagnosis not present

## 2017-04-16 DIAGNOSIS — R293 Abnormal posture: Secondary | ICD-10-CM

## 2017-04-16 DIAGNOSIS — M25611 Stiffness of right shoulder, not elsewhere classified: Secondary | ICD-10-CM

## 2017-04-16 DIAGNOSIS — M6281 Muscle weakness (generalized): Secondary | ICD-10-CM | POA: Diagnosis not present

## 2017-04-16 NOTE — Patient Instructions (Addendum)

## 2017-04-16 NOTE — Therapy (Addendum)
Abigail [name] [address]  [place], [place], [address] Phone: [phone]   Fax:  [phone]  Physical Therapy Treatment  Patient Details  Name: Abigail [name] MRN: [id number] Date of Birth: [birth year] Referring Provider: Wyatt Portela, PA-C   Encounter Date: 04/16/2017  PT End of Session - 04/16/17 0938    Visit Number  4    Number of Visits  12    Date for PT Re-Evaluation  05/16/17    Authorization Type  BCBS    PT Start Time  0934    PT Stop Time  1035    PT Time Calculation (min)  61 min    Activity Tolerance  Patient tolerated treatment well    Behavior During Therapy  [facility] for tasks assessed/performed       Past Medical History:  Diagnosis Date  . Allergic state 12/04/2010  . Allergy    seasonal- spring  . Anemia    mild  . Anxiety 02/11/2011  . BRCA gene mutation positive 11/21/2015  . Breast cancer ([place]) 06/05/11   RIGHT  BX,POSTERIOR ,SLIGHT LATERAL=FOCAL ATYPIA LOBULAR HYPERPLASIA IN BACKGROUND OF EXTENSIVE STROMAL FIBROSIS  . 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 Chicken pox as a child  . History of mononucleosis   . History of streptococcal pharyngitis   . Hot flashes   . Insomnia 02/11/2011  . Post-operative nausea and vomiting    past c-section  . Preventative health care 12/04/2010  . S/P chemotherapy, time since less than 4 weeks 05/23/2011   completed 4 doses docetaxel/cyclophosphamide  . Urticaria   . UTI (lower urinary tract infection) 11/28/2011  . Vaginitis 10/08/2011    Past Surgical History:  Procedure Laterality Date  . BREAST RECONSTRUCTION Bilateral 03/24/2012   Procedure: BREAST RECONSTRUCTION WITH PLACEMENT OF SILICONE GEL IMPLANTS;  Surgeon: Crissie Reese, MD;  Location: [facility];  Service: Plastics;  Laterality: Bilateral;  . [place]   X 1  . MASTECTOMY W/ SENTINEL NODE BIOPSY  08/07/2011   Procedure: MASTECTOMY  WITH SENTINEL LYMPH NODE BIOPSY;  Surgeon: Merrie Roof, MD;  Location: [facility];  Service: General;  Laterality: Bilateral;  bilateral mastectomy  . MOUTH SURGERY  1997   gum surgery  . PORT-A-CATH REMOVAL  08/07/2011   Procedure: REMOVAL PORT-A-CATH;  Surgeon: Merrie Roof, MD;  Location: [facility];  Service: General;  Laterality: Right;  . PORTACATH PLACEMENT  03/20/2011   Procedure: INSERTION PORT-A-CATH;  Surgeon: Merrie Roof, MD;  Location: [facility];  Service: General;  Laterality: Right;  placement of port -Right Subclavian Vein  . TISSUE EXPANDER PLACEMENT  08/07/2011   Procedure: TISSUE EXPANDER;  Surgeon: Crissie Reese, MD;  Location: [facility];  Service: Plastics;  Laterality: Bilateral;  BILATERAL PLACEMENT OF TISSUE EXPANDERS FOR BREAST RECONSTRUCTION  . TUBAL LIGATION     1999  . VARICOSE VEIN SURGERY     [facility]    There were no vitals filed for this visit.  Subjective Assessment - 04/16/17 0945    Subjective  Pt. reports some pain with reaching back to pick up bag from back seat in car yesterday.  Does feel shoulder is improving with less frequent pain.  Did have some R shoulder pain with sleeper stretch and wishes to review.      Patient Stated Goals  "to have normal movement and use of  R UE"    Currently in Pain?  No/denies    Pain Score  0-No pain Pain rising to 7/10 with reaching behind     Pain Location  Shoulder    Pain Orientation  Right    Pain Descriptors / Indicators  Burning    Pain Type  Acute pain    Pain Onset  More than a month ago    Pain Frequency  Intermittent    Multiple Pain Sites  No                      [facility] Adult PT Treatment/Exercise - 04/16/17 0947      Self-Care   Self-Care  Other Self-Care Comments    Other Self-Care Comments   Discussed current HEP with cues to avoid painful ranges; Discussed only performing towel IR stretch or sleeper stretch if able to perform without pain       Shoulder  Exercises: Standing   External Rotation  Both;15 reps;Strengthening    External Rotation Limitations  B at doorseal     Extension  Both;Theraband;Strengthening;10 reps    Theraband Level (Shoulder Extension)  Level 2 (Red)    Row  Both;Theraband;Strengthening;15 reps    Theraband Level (Shoulder Row)  Level 2 (Red)      Shoulder Exercises: ROM/Strengthening   UBE (Upper Arm Bike)  L2.0 fwd/back x 3' each    Lat Pull Limitations  10# - emphasis on scap retraction & depression on pull down, AAROM R shoulder flexion on release - no pain on pull down, quick "catching" pain mid range on release     Cybex Row  15 reps    Cybex Row Limitations  10# - low handles     Wall Pushups  10 reps    Wall Pushups Limitations  Focusing on scapular protraction/retraction without allowing elevation       Shoulder Exercises: Stretch   Corner Stretch  2 reps;30 seconds    Corner Stretch Limitations  low, high; unable to perform mid stretch due to R shoulder pain     Internal Rotation Stretch  30 seconds some pain thus terminated     Internal Rotation Stretch Limitations  sleeper stretch    Other Shoulder Stretches  Hooklying chest stretch on pool noodle (varying arm position as tol) x 2 min  with overpressure at B shoulders into stretch       Modalities   Modalities  Iontophoresis      Iontophoresis   Type of Iontophoresis  Dexamethasone    Location  R anterior shoulder over area of tenderness     Dose  1.69m, 818mmin    Time  4-6 hours wear time       Manual Therapy   Manual Therapy  Soft tissue mobilization;Myofascial release;Other (comment)    Manual therapy comments  hooklying on pool noodle    Soft tissue mobilization  R pecs, R UT    Myofascial Release  TPR to R latreal pec and R UT; pt. reporting "releasing" sensation following this             PT Education - 04/16/17 1044    Education provided  Yes    Education Details  Iontophoresis educational handout including precautions,  contraindications, and proper wear time with pt. verbalizing understanding;  Hooklying shoulder horizontal abduction, wall pushup     Person(s) Educated  Patient    Methods  Explanation;Verbal cues;Handout    Comprehension  Verbalized understanding;Verbal cues required;Need  further instruction       PT Short Term Goals - 04/16/17 0954      PT SHORT TERM GOAL #1   Title  Independent with initial HEP    Status  Achieved      PT SHORT TERM GOAL #2   Title  Pt will verbalize/demonstrate understanding of neutral spine and shoulder posture for imporved GH mechanics    Status  Achieved        PT Long Term Goals - 04/04/17 8850      PT LONG TERM GOAL #1   Title  Independent with ongoing/advanced HEP    Status  On-going      PT LONG TERM GOAL #2   Title  R shoulder AROM w/in 10 degrees of L w/o increased pain    Status  On-going      PT LONG TERM GOAL #3   Title  R shoulder strength >/= 4/5 to 4+/5 w/o increased pain on resistance    Status  On-going      PT LONG TERM GOAL #4   Title  Pt will report ability to perform ADLs such as dressing and light household chores with >/= 50% reduction in pain    Status  On-going            Plan - 04/16/17 0955    Clinical Impression Statement  Skylar seen to start treatment reporting she feels R shoulder pain is improving.  Did have increased pain reaching to back seat in car yesterday.  Tolerated all scapular strengthening activities in treatment well today with addition of wall pushup.  Some cueing required with therex today for full scapular activation.  Pt. requesting iontophoresis patch to end treatment and MD signed order for this.  Iontophoresis patch #1/6 applied to R anterior shoulder over area of tenderness.  Will continue to progress toward goals.      PT Treatment/Interventions  Patient/family education;Neuromuscular re-education;ADLs/Self Care Home Management;Therapeutic exercise;Therapeutic activities;Manual techniques;Passive  range of motion;Dry needling;Taping;Iontophoresis 5m/ml Dexamethasone;Moist Heat;Cryotherapy;Vasopneumatic Device    Consulted and Agree with Plan of Care  Patient       Patient will benefit from skilled therapeutic intervention in order to improve the following deficits and impairments:  Pain, Postural dysfunction, Decreased range of motion, Impaired flexibility, Decreased strength, Increased muscle spasms, Impaired UE functional use, Decreased activity tolerance  Visit Diagnosis: Acute pain of right shoulder  Stiffness of right shoulder, not elsewhere classified  Abnormal posture  Muscle weakness (generalized)     Problem List Patient Active Problem List   Diagnosis Date Noted  . Colon cancer screening 11/26/2015  . BRCA gene mutation positive 11/21/2015  . Osteopenia 05/24/2013  . Breast [place] of [place] 11/18/2012  . Vaginal dryness 12/03/2011  . S/P mastectomy, bilateral 12/03/2011  . Menopause 12/03/2011  . Anemia 12/03/2011  . UTI (lower urinary tract infection) 11/28/2011  . Anxiety 02/11/2011  . Insomnia 02/11/2011  . Allergic state 12/04/2010  . Preventative health care 12/04/2010  . History of mononucleosis   . DERMATITIS, ATOPIC 11/17/2010    MBess Harvest PTA 04/17/17 11:40 AM  [facility] [place] [address]Phone: [phone]  Fax:  [phone] Name: CCONNEE IKNERMRN: [id number] of Birth: 1Jan 03, 1967

## 2017-04-18 ENCOUNTER — Ambulatory Visit: Payer: BLUE CROSS/BLUE SHIELD

## 2017-04-18 DIAGNOSIS — M6281 Muscle weakness (generalized): Secondary | ICD-10-CM | POA: Diagnosis not present

## 2017-04-18 DIAGNOSIS — R293 Abnormal posture: Secondary | ICD-10-CM | POA: Diagnosis not present

## 2017-04-18 DIAGNOSIS — M25511 Pain in right shoulder: Secondary | ICD-10-CM

## 2017-04-18 DIAGNOSIS — M25611 Stiffness of right shoulder, not elsewhere classified: Secondary | ICD-10-CM | POA: Diagnosis not present

## 2017-04-18 NOTE — Therapy (Signed)
[facility] [address]  [place], [place], [address] Phone: [phone]   Fax:  [phone]  Physical Therapy Treatment  Patient Details  Name: Abigail [name] MRN: [id number] Date of Birth: 1965/08/31 Referring Provider: Wyatt Portela, PA-C   Encounter Date: 04/18/2017  PT End of Session - 04/18/17 0852    Visit Number  5    Number of Visits  12    Date for PT Re-Evaluation  05/16/17    Authorization Type  BCBS    PT Start Time  0845    PT Stop Time  0928    PT Time Calculation (min)  43 min    Activity Tolerance  Patient tolerated treatment well    Behavior During Therapy  [facility] for tasks assessed/performed       Past Medical History:  Diagnosis Date  . Allergic state 12/04/2010  . Allergy    seasonal- spring  . Anemia    mild  . Anxiety 02/11/2011  . BRCA gene mutation positive 11/21/2015  . Breast cancer ([place]) 06/05/11   RIGHT  BX,POSTERIOR ,SLIGHT LATERAL=FOCAL ATYPIA LOBULAR HYPERPLASIA IN BACKGROUND OF EXTENSIVE STROMAL FIBROSIS  . 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 Chicken pox as a child  . History of mononucleosis   . History of streptococcal pharyngitis   . Hot flashes   . Insomnia 02/11/2011  . Post-operative nausea and vomiting    past c-section  . Preventative health care 12/04/2010  . S/P chemotherapy, time since less than 4 weeks 05/23/2011   completed 4 doses docetaxel/cyclophosphamide  . Urticaria   . UTI (lower urinary tract infection) 11/28/2011  . Vaginitis 10/08/2011    Past Surgical History:  Procedure Laterality Date  . BREAST RECONSTRUCTION Bilateral 03/24/2012   Procedure: BREAST RECONSTRUCTION WITH PLACEMENT OF SILICONE GEL IMPLANTS;  Surgeon: Crissie Reese, MD;  Location: [place];  Service: Plastics;  Laterality: Bilateral;  . [place]   X 1  . MASTECTOMY W/ SENTINEL NODE BIOPSY  08/07/2011   Procedure: MASTECTOMY  WITH SENTINEL LYMPH NODE BIOPSY;  Surgeon: Merrie Roof, MD;  Location: [place];  Service: General;  Laterality: Bilateral;  bilateral mastectomy  . MOUTH SURGERY  1997   gum surgery  . PORT-A-CATH REMOVAL  08/07/2011   Procedure: REMOVAL PORT-A-CATH;  Surgeon: Merrie Roof, MD;  Location: [place];  Service: General;  Laterality: Right;  . PORTACATH PLACEMENT  03/20/2011   Procedure: INSERTION PORT-A-CATH;  Surgeon: Merrie Roof, MD;  Location: [place];  Service: General;  Laterality: Right;  placement of port -Right Subclavian Vein  . TISSUE EXPANDER PLACEMENT  08/07/2011   Procedure: TISSUE EXPANDER;  Surgeon: Crissie Reese, MD;  Location: [place];  Service: Plastics;  Laterality: Bilateral;  BILATERAL PLACEMENT OF TISSUE EXPANDERS FOR BREAST RECONSTRUCTION  . TUBAL LIGATION     1999  . VARICOSE VEIN SURGERY     [place] [facility]    There were no vitals filed for this visit.  Subjective Assessment - 04/18/17 0850    Subjective  Pt. doing well.  Feels ionto patch may have helped, "some".  Does feel she is improving with therapy.      Patient Stated Goals  "to have normal movement and use of R UE"    Currently in Pain?  No/denies    Pain Score  0-No pain    Multiple  Pain Sites  No                      [facility] Adult PT Treatment/Exercise - 04/18/17 0854      Shoulder Exercises: Prone   Extension  Both;10 reps;Strengthening    Extension Limitations  Prone "I" on green p-ball    Horizontal ABduction 1  Both;10 reps;Strengthening    Horizontal ABduction 1 Limitations  prone "T" on green p-ball    Horizontal ABduction 2  Both;10 reps;Strengthening    Horizontal ABduction 2 Limitations  Prone "Y" on green p-ball       Shoulder Exercises: Standing   External Rotation  Right;10 reps;Theraband Cues for scap. retraction     Theraband Level (Shoulder External Rotation)  Level 2 (Red)    External Rotation Limitations  --    Internal Rotation  Right;10  reps;Theraband    Theraband Level (Shoulder Internal Rotation)  Level 2 (Red)    Row  15 reps;Strengthening Required cueing for scapular retraction     Row Limitations  TRX      Shoulder Exercises: ROM/Strengthening   UBE (Upper Arm Bike)  L2.5 fwd/back x 3' each    Lat Pull Limitations  15# x 15 reps     Wall Pushups  15 reps    Wall Pushups Limitations  orange p-ball     Other ROM/Strengthening Exercises  BATCA chest press plus 15# x 20 reps     Other ROM/Strengthening Exercises  Serratus roll with foam roller on wall x 10 reps       Shoulder Exercises: Stretch   Corner Stretch  2 reps;30 seconds    Corner Stretch Limitations  low, mid high able to perform pain free with reduced pressure in mid                PT Short Term Goals - 04/16/17 0954      PT SHORT TERM GOAL #1   Title  Independent with initial HEP    Status  Achieved      PT SHORT TERM GOAL #2   Title  Pt will verbalize/demonstrate understanding of neutral spine and shoulder posture for imporved GH mechanics    Status  Achieved        PT Long Term Goals - 04/04/17 0932      PT LONG TERM GOAL #1   Title  Independent with ongoing/advanced HEP    Status  On-going      PT LONG TERM GOAL #2   Title  R shoulder AROM w/in 10 degrees of L w/o increased pain    Status  On-going      PT LONG TERM GOAL #3   Title  R shoulder strength >/= 4/5 to 4+/5 w/o increased pain on resistance    Status  On-going      PT LONG TERM GOAL #4   Title  Pt will report ability to perform ADLs such as dressing and light household chores with >/= 50% reduction in pain    Status  On-going            Plan - 04/18/17 6712    Clinical Impression Statement  Pt. verbalizing she feels she is improving with therapy.  Reports some remaining pain with lifting plates to 2nd shelf in kitchen and reaching to back seat in car.  Tolerated addition of prone I's, T's, Y's, and serratus roll on wall well today.  Reports daily adherence  to HEP.  Does require  cueing for proper scapular activation with therex.  Reports decreased pain with dressing in mornings.  Progressing well toward goals.      PT Treatment/Interventions  Patient/family education;Neuromuscular re-education;ADLs/Self Care Home Management;Therapeutic exercise;Therapeutic activities;Manual techniques;Passive range of motion;Dry needling;Taping;Iontophoresis 62m/ml Dexamethasone;Moist Heat;Cryotherapy;Vasopneumatic Device    Consulted and Agree with Plan of Care  Patient       Patient will benefit from skilled therapeutic intervention in order to improve the following deficits and impairments:  Pain, Postural dysfunction, Decreased range of motion, Impaired flexibility, Decreased strength, Increased muscle spasms, Impaired UE functional use, Decreased activity tolerance  Visit Diagnosis: Acute pain of right shoulder  Stiffness of right shoulder, not elsewhere classified  Abnormal posture  Muscle weakness (generalized)     Problem List Patient Active Problem List   Diagnosis Date Noted  . Colon cancer screening 11/26/2015  . BRCA gene mutation positive 11/21/2015  . Osteopenia 05/24/2013  . Breast cancer of upper-inner quadrant of left female breast ([place] 11/18/2012  . Vaginal dryness 12/03/2011  . S/P mastectomy, bilateral 12/03/2011  . Menopause 12/03/2011  . Anemia 12/03/2011  . UTI (lower urinary tract infection) 11/28/2011  . Anxiety 02/11/2011  . Insomnia 02/11/2011  . Allergic state 12/04/2010  . Preventative health care 12/04/2010  . History of mononucleosis   . DERMATITIS, ATOPIC 11/17/2010    MBess Harvest PTA 04/18/17 12:38 PM  [facility] [address] [place] [address]Phone: [phone]  Fax:  [phone] Name: CLUMEN BRINLEEMRN: [id number] of Birth: [birth year]

## 2017-04-21 ENCOUNTER — Ambulatory Visit: Payer: BLUE CROSS/BLUE SHIELD

## 2017-04-21 DIAGNOSIS — M25511 Pain in right shoulder: Secondary | ICD-10-CM | POA: Diagnosis not present

## 2017-04-21 DIAGNOSIS — M25611 Stiffness of right shoulder, not elsewhere classified: Secondary | ICD-10-CM

## 2017-04-21 DIAGNOSIS — M6281 Muscle weakness (generalized): Secondary | ICD-10-CM | POA: Diagnosis not present

## 2017-04-21 DIAGNOSIS — R293 Abnormal posture: Secondary | ICD-10-CM | POA: Diagnosis not present

## 2017-04-21 NOTE — Therapy (Signed)
[facility] [address]  [place], [place], [address] Phone: [phone]   Fax:  [phone]  Physical Therapy Treatment  Patient Details  Name: Abigail [name] MRN: [id number] Date of Birth: 1965-05-21 Referring Provider: Wyatt Portela, PA-C   Encounter Date: 04/21/2017  PT End of Session - 04/21/17 0935    Visit Number  6    Number of Visits  12    Date for PT Re-Evaluation  05/16/17    Authorization Type  BCBS    PT Start Time  0932    PT Stop Time  1014    PT Time Calculation (min)  42 min    Activity Tolerance  Patient tolerated treatment well    Behavior During Therapy  [facility] for tasks assessed/performed       Past Medical History:  Diagnosis Date  . Allergic state 12/04/2010  . Allergy    seasonal- spring  . Anemia    mild  . Anxiety 02/11/2011  . BRCA gene mutation positive 11/21/2015  . Breast cancer ([place]) 06/05/11   RIGHT  BX,POSTERIOR ,SLIGHT LATERAL=FOCAL ATYPIA LOBULAR HYPERPLASIA IN BACKGROUND OF EXTENSIVE STROMAL FIBROSIS  . 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 Chicken pox as a child  . History of mononucleosis   . History of streptococcal pharyngitis   . Hot flashes   . Insomnia 02/11/2011  . Post-operative nausea and vomiting    past c-section  . Preventative health care 12/04/2010  . S/P chemotherapy, time since less than 4 weeks 05/23/2011   completed 4 doses docetaxel/cyclophosphamide  . Urticaria   . UTI (lower urinary tract infection) 11/28/2011  . Vaginitis 10/08/2011    Past Surgical History:  Procedure Laterality Date  . BREAST RECONSTRUCTION Bilateral 03/24/2012   Procedure: BREAST RECONSTRUCTION WITH PLACEMENT OF SILICONE GEL IMPLANTS;  Surgeon: Crissie Reese, MD;  Location: [place];  Service: Plastics;  Laterality: Bilateral;  . [place]   X 1  . MASTECTOMY W/ SENTINEL NODE BIOPSY  08/07/2011   Procedure: MASTECTOMY  WITH SENTINEL LYMPH NODE BIOPSY;  Surgeon: Merrie Roof, MD;  Location: [place];  Service: General;  Laterality: Bilateral;  bilateral mastectomy  . MOUTH SURGERY  1997   gum surgery  . PORT-A-CATH REMOVAL  08/07/2011   Procedure: REMOVAL PORT-A-CATH;  Surgeon: Merrie Roof, MD;  Location: [place];  Service: General;  Laterality: Right;  . PORTACATH PLACEMENT  03/20/2011   Procedure: INSERTION PORT-A-CATH;  Surgeon: Merrie Roof, MD;  Location: [place];  Service: General;  Laterality: Right;  placement of port -Right Subclavian Vein  . TISSUE EXPANDER PLACEMENT  08/07/2011   Procedure: TISSUE EXPANDER;  Surgeon: Crissie Reese, MD;  Location: [place];  Service: Plastics;  Laterality: Bilateral;  BILATERAL PLACEMENT OF TISSUE EXPANDERS FOR BREAST RECONSTRUCTION  . TUBAL LIGATION     1999  . VARICOSE VEIN SURGERY     [place] [facility]    There were no vitals filed for this visit.  Subjective Assessment - 04/21/17 0938    Subjective  Pt. reporting only R shoulder "stiffness" over weekend.  Feels she is improving with therapy.      Patient Stated Goals  "to have normal movement and use of R UE"    Currently in Pain?  No/denies    Pain Score  0-No pain    Multiple Pain Sites  No  No data recorded       [facility] Adult PT Treatment/Exercise - 04/21/17 0941      Shoulder Exercises: Supine   Other Supine Exercises  Hooklying R shoulder ABC's with red med ball x 1 round; CCW, CW x 10 reps each way       Shoulder Exercises: Prone   Extension  Both;10 reps;Strengthening    Extension Weight (lbs)  1    Extension Limitations  Prone "I" on green p-ball    Horizontal ABduction 1  Both;10 reps;Strengthening    Horizontal ABduction 1 Limitations  prone "T" on green p-ball Performed in lower horizontal abduction range to avoid pain     Horizontal ABduction 2  Both;15 reps    Horizontal ABduction 2 Limitations  Prone "Y" on green p-ball        Shoulder Exercises: Sidelying   External Rotation  Right;10 reps;Weights    External Rotation Weight (lbs)  2    External Rotation Limitations  cues for scapular squeeze       Shoulder Exercises: Standing   Flexion  Both;10 reps    Flexion Limitations  Leaning on 1/2 foam bolster on wall  cues for scapular retraction/depression before each rep    ABduction  Both;10 reps    ABduction Limitations  scaption; leaning on 1/2 foam bolster on wall  cues for scapular retraction/depression before each rep    Row  15 reps;Strengthening Cues required for scap. retraction     Row Limitations  TRX      Shoulder Exercises: ROM/Strengthening   UBE (Upper Arm Bike)  L3.0 fwd/back x 3' each    Lat Pull Limitations  15# x 15 reps  Cues for scap. retraction/depression     Cybex Row  15 reps    Cybex Row Limitations  15# - low handles       Shoulder Exercises: Stretch   Other Shoulder Stretches  Hooklying chest stretch on pool noodle (varying arm position as tol) x 2 min       Iontophoresis   Type of Iontophoresis  Dexamethasone    Location  R anterior/lateral shoulder over area of tenderness     Dose  1.18m, 855mmin    Time  4-6 hours wear time                PT Short Term Goals - 04/16/17 0954      PT SHORT TERM GOAL #1   Title  Independent with initial HEP    Status  Achieved      PT SHORT TERM GOAL #2   Title  Pt will verbalize/demonstrate understanding of neutral spine and shoulder posture for imporved GH mechanics    Status  Achieved        PT Long Term Goals - 04/04/17 085366    PT LONG TERM GOAL #1   Title  Independent with ongoing/advanced HEP    Status  On-going      PT LONG TERM GOAL #2   Title  R shoulder AROM w/in 10 degrees of L w/o increased pain    Status  On-going      PT LONG TERM GOAL #3   Title  R shoulder strength >/= 4/5 to 4+/5 w/o increased pain on resistance    Status  On-going      PT LONG TERM GOAL #4   Title  Pt will report ability to perform  ADLs such as dressing and light household chores with >/= 50% reduction  in pain    Status  On-going            Plan - 04/21/17 0935    Clinical Impression Statement  Pt. reporting she feels she is improving with therapy.  Main complaint was stiffness over weekend.  Having less pain with dressing now.  Able to progress I's, T's, Y's, and tolerated scaption/flexion in standing well today.  Continues to require cueing for proper scapular activation with strengthening therex.  Did feel ionto patch reduced her shoulder "stiffness" in past visits and requesting to end with this.  Progressing well toward goals.    PT Treatment/Interventions  Patient/family education;Neuromuscular re-education;ADLs/Self Care Home Management;Therapeutic exercise;Therapeutic activities;Manual techniques;Passive range of motion;Dry needling;Taping;Iontophoresis 71m/ml Dexamethasone;Moist Heat;Cryotherapy;Vasopneumatic Device    Consulted and Agree with Plan of Care  Patient       Patient will benefit from skilled therapeutic intervention in order to improve the following deficits and impairments:  Pain, Postural dysfunction, Decreased range of motion, Impaired flexibility, Decreased strength, Increased muscle spasms, Impaired UE functional use, Decreased activity tolerance  Visit Diagnosis: Acute pain of right shoulder  Stiffness of right shoulder, not elsewhere classified  Abnormal posture  Muscle weakness (generalized)     Problem List Patient Active Problem List   Diagnosis Date Noted  . Colon cancer screening 11/26/2015  . BRCA gene mutation positive 11/21/2015  . Osteopenia 05/24/2013  . Breast cancer of upper-inner quadrant of left female breast ([place] 11/18/2012  . Vaginal dryness 12/03/2011  . S/P mastectomy, bilateral 12/03/2011  . Menopause 12/03/2011  . Anemia 12/03/2011  . UTI (lower urinary tract infection) 11/28/2011  . Anxiety 02/11/2011  . Insomnia 02/11/2011  . Allergic state  12/04/2010  . Preventative health care 12/04/2010  . History of mononucleosis   . DERMATITIS, ATOPIC 11/17/2010    MBess Harvest PTA 04/21/17 1:04 PM  [facility] [address] [place] [address]Phone: [phone]  Fax:  [phone] Name: CDONICE ALPERINMRN: [id number] of Birth: [birth year]

## 2017-04-25 ENCOUNTER — Ambulatory Visit: Payer: BLUE CROSS/BLUE SHIELD | Admitting: Physical Therapy

## 2017-04-25 ENCOUNTER — Encounter: Payer: Self-pay | Admitting: Physical Therapy

## 2017-04-25 DIAGNOSIS — R293 Abnormal posture: Secondary | ICD-10-CM | POA: Diagnosis not present

## 2017-04-25 DIAGNOSIS — M25611 Stiffness of right shoulder, not elsewhere classified: Secondary | ICD-10-CM

## 2017-04-25 DIAGNOSIS — M6281 Muscle weakness (generalized): Secondary | ICD-10-CM | POA: Diagnosis not present

## 2017-04-25 DIAGNOSIS — M25511 Pain in right shoulder: Secondary | ICD-10-CM | POA: Diagnosis not present

## 2017-04-25 NOTE — Therapy (Signed)
[facility] [address]  [place], [place], [address] Phone: [phone]   Fax:  [phone]  Physical Therapy Treatment  Patient Details  Name: Abigail [name] MRN: [id number] Date of Birth: Feb 27, 1965 Referring Provider: Wyatt Portela, PA-C   Encounter Date: 04/25/2017  PT End of Session - 04/25/17 0842    Visit Number  6    Number of Visits  12    Date for PT Re-Evaluation  05/16/17    Authorization Type  BCBS    PT Start Time  [phone]    PT Stop Time  0926    PT Time Calculation (min)  44 min    Activity Tolerance  Patient tolerated treatment well    Behavior During Therapy  [facility] for tasks assessed/performed       Past Medical History:  Diagnosis Date  . Allergic state 12/04/2010  . Allergy    seasonal- spring  . Anemia    mild  . Anxiety 02/11/2011  . BRCA gene mutation positive 11/21/2015  . Breast cancer ([place]) 06/05/11   RIGHT  BX,POSTERIOR ,SLIGHT LATERAL=FOCAL ATYPIA LOBULAR HYPERPLASIA IN BACKGROUND OF EXTENSIVE STROMAL FIBROSIS  . 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 Chicken pox as a child  . History of mononucleosis   . History of streptococcal pharyngitis   . Hot flashes   . Insomnia 02/11/2011  . Post-operative nausea and vomiting    past c-section  . Preventative health care 12/04/2010  . S/P chemotherapy, time since less than 4 weeks 05/23/2011   completed 4 doses docetaxel/cyclophosphamide  . Urticaria   . UTI (lower urinary tract infection) 11/28/2011  . Vaginitis 10/08/2011    Past Surgical History:  Procedure Laterality Date  . BREAST RECONSTRUCTION Bilateral 03/24/2012   Procedure: BREAST RECONSTRUCTION WITH PLACEMENT OF SILICONE GEL IMPLANTS;  Surgeon: Crissie Reese, MD;  Location: [place];  Service: Plastics;  Laterality: Bilateral;  . [place]   X 1  . MASTECTOMY W/ SENTINEL NODE BIOPSY  08/07/2011   Procedure: MASTECTOMY  WITH SENTINEL LYMPH NODE BIOPSY;  Surgeon: Merrie Roof, MD;  Location: [place];  Service: General;  Laterality: Bilateral;  bilateral mastectomy  . MOUTH SURGERY  1997   gum surgery  . PORT-A-CATH REMOVAL  08/07/2011   Procedure: REMOVAL PORT-A-CATH;  Surgeon: Merrie Roof, MD;  Location: [place];  Service: General;  Laterality: Right;  . PORTACATH PLACEMENT  03/20/2011   Procedure: INSERTION PORT-A-CATH;  Surgeon: Merrie Roof, MD;  Location: [place];  Service: General;  Laterality: Right;  placement of port -Right Subclavian Vein  . TISSUE EXPANDER PLACEMENT  08/07/2011   Procedure: TISSUE EXPANDER;  Surgeon: Crissie Reese, MD;  Location: [place];  Service: Plastics;  Laterality: Bilateral;  BILATERAL PLACEMENT OF TISSUE EXPANDERS FOR BREAST RECONSTRUCTION  . TUBAL LIGATION     1999  . VARICOSE VEIN SURGERY     [place] [facility]    There were no vitals filed for this visit.  Subjective Assessment - 04/25/17 0844    Subjective  Pt reports she has been using her parents' restorator as a home UBE. Pt reporting improvement with lifting activities at home but still having difficulty with things requiring putting pressure through arm (scrubbing, ironing, etc). Also stating greatest difficulty with mid range activities.    Patient Stated Goals  "to have normal movement and use of  R UE"    Currently in Pain?  No/denies         [facility] PT Assessment - 04/25/17 0842      Assessment   Medical Diagnosis  R shoulder OA & bursitis    Referring Provider  Wyatt Portela, PA-C    Onset Date/Surgical Date  -- Sept 2018    Next MD Visit  05/13/17      AROM   Right Shoulder Flexion  137 Degrees    Right Shoulder ABduction  114 Degrees    Right Shoulder Internal Rotation  -- FIR to L1    Right Shoulder External Rotation  -- FER to T3            No data recorded       [facility] Adult PT Treatment/Exercise - 04/25/17 0842      Exercises   Exercises  Shoulder       Shoulder Exercises: Standing   Extension  Both;15 reps;Theraband;Strengthening    Theraband Level (Shoulder Extension)  Level 3 (Green)    Row  Both;15 reps;Theraband;Strengthening    Theraband Level (Shoulder Row)  Level 3 (Green)    Other Standing Exercises  R scapular depression + retraction with red TB 15 x 5"      Shoulder Exercises: ROM/Strengthening   UBE (Upper Arm Bike)  L3.0 fwd/back x 3' each    Ball on Wall  R shoulder circles CW/CCW at 90 dg flexion with small ball on wall x15 each, with red med ball x15 each      Manual Therapy   Manual Therapy  Soft tissue mobilization;Myofascial release;Taping    Manual therapy comments  L sidelying    Soft tissue mobilization  R ant/mid deltoid, teres group & infraspinatus    Myofascial Release  manual TPR R infraspinatus, teres group & middle deltoid    Kinesiotex  Inhibit Muscle      Kinesiotix   Inhibit Muscle   R shoulder - deltoid inhibition & impingement pattern               PT Short Term Goals - 04/16/17 0954      PT SHORT TERM GOAL #1   Title  Independent with initial HEP    Status  Achieved      PT SHORT TERM GOAL #2   Title  Pt will verbalize/demonstrate understanding of neutral spine and shoulder posture for imporved GH mechanics    Status  Achieved        PT Long Term Goals - 04/25/17 1235      PT LONG TERM GOAL #1   Title  Independent with ongoing/advanced HEP    Status  Partially Met      PT LONG TERM GOAL #2   Title  R shoulder AROM w/in 10 degrees of L w/o increased pain    Status  On-going      PT LONG TERM GOAL #3   Title  R shoulder strength >/= 4/5 to 4+/5 w/o increased pain on resistance    Status  On-going      PT LONG TERM GOAL #4   Title  Pt will report ability to perform ADLs such as dressing and light household chores with >/= 50% reduction in pain    Status  Partially Met            Plan - 04/25/17 0847    Clinical Impression Statement  Gearldene reporting improvement  with ability to complete activities around home, esp dressing (  tucking in shirt) and lifting activities, but still noting difficulty with activities where she is putting pressure through arm such as ironing or scrubbing while cleaning. R shoulder flexion & IR ROM improving, with greatest limitation still with abduction motions. Increased muscle tension/taut bands persist in ant/mid deltoid & posterior complex, esp teres group and infraspinatus, which continues to contribute to rounded shoulder posture creating increased likelihood for impingement. This was addressed wih manual therapy and initial trial of kinesiotaping to R shoulder for deltoid inhibition/impingement. Some goals partially met at this stage.    Rehab Potential  Good    PT Treatment/Interventions  Patient/family education;Neuromuscular re-education;ADLs/Self Care Home Management;Therapeutic exercise;Therapeutic activities;Manual techniques;Passive range of motion;Dry needling;Taping;Iontophoresis 55m/ml Dexamethasone;Moist Heat;Cryotherapy;Vasopneumatic Device    Consulted and Agree with Plan of Care  Patient       Patient will benefit from skilled therapeutic intervention in order to improve the following deficits and impairments:  Pain, Postural dysfunction, Decreased range of motion, Impaired flexibility, Decreased strength, Increased muscle spasms, Impaired UE functional use, Decreased activity tolerance  Visit Diagnosis: Acute pain of right shoulder  Stiffness of right shoulder, not elsewhere classified  Abnormal posture  Muscle weakness (generalized)     Problem List Patient Active Problem List   Diagnosis Date Noted  . Colon cancer screening 11/26/2015  . BRCA gene mutation positive 11/21/2015  . Osteopenia 05/24/2013  . Breast cancer of upper-inner quadrant of left female breast ([place] 11/18/2012  . Vaginal dryness 12/03/2011  . S/P mastectomy, bilateral 12/03/2011  . Menopause 12/03/2011  . Anemia 12/03/2011  .  UTI (lower urinary tract infection) 11/28/2011  . Anxiety 02/11/2011  . Insomnia 02/11/2011  . Allergic state 12/04/2010  . Preventative health care 12/04/2010  . History of mononucleosis   . DERMATITIS, ATOPIC 11/17/2010    JPercival Spanish PT, MPT 04/25/2017, 12:38 PM  [facility] [place] [address]Phone: [phone]  Fax:  [phone] Name: CSHARRONDA SCHWEERSMRN: [id number] of Birth: 1December 19, 1967

## 2017-04-30 ENCOUNTER — Ambulatory Visit: Payer: BLUE CROSS/BLUE SHIELD | Attending: Orthopedic Surgery

## 2017-04-30 DIAGNOSIS — M6281 Muscle weakness (generalized): Secondary | ICD-10-CM | POA: Insufficient documentation

## 2017-04-30 DIAGNOSIS — M25611 Stiffness of right shoulder, not elsewhere classified: Secondary | ICD-10-CM | POA: Diagnosis not present

## 2017-04-30 DIAGNOSIS — M25511 Pain in right shoulder: Secondary | ICD-10-CM | POA: Diagnosis not present

## 2017-04-30 DIAGNOSIS — R293 Abnormal posture: Secondary | ICD-10-CM | POA: Diagnosis not present

## 2017-04-30 NOTE — Therapy (Signed)
[facility] [address]  [place], [place], [address] Phone: [phone]   Fax:  [phone]  Physical Therapy Treatment  Patient Details  Name: Abigail [name] MRN: [id number] Date of Birth: [birth year] Referring Provider: Wyatt Portela, PA-C   Encounter Date: 04/30/2017  PT End of Session - 04/30/17 0851    Visit Number  8 visit count correct from previous visit    Number of Visits  12    Date for PT Re-Evaluation  05/16/17    Authorization Type  BCBS    PT Start Time  [phone]    PT Stop Time  0932    PT Time Calculation (min)  46 min    Activity Tolerance  Patient tolerated treatment well    Behavior During Therapy  [facility] for tasks assessed/performed       Past Medical History:  Diagnosis Date  . Allergic state 12/04/2010  . Allergy    seasonal- spring  . Anemia    mild  . Anxiety 02/11/2011  . BRCA gene mutation positive 11/21/2015  . Breast cancer ([place]) 06/05/11   RIGHT  BX,POSTERIOR ,SLIGHT LATERAL=FOCAL ATYPIA LOBULAR HYPERPLASIA IN BACKGROUND OF EXTENSIVE STROMAL FIBROSIS  . 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 Chicken pox as a child  . History of mononucleosis   . History of streptococcal pharyngitis   . Hot flashes   . Insomnia 02/11/2011  . Post-operative nausea and vomiting    past c-section  . Preventative health care 12/04/2010  . S/P chemotherapy, time since less than 4 weeks 05/23/2011   completed 4 doses docetaxel/cyclophosphamide  . Urticaria   . UTI (lower urinary tract infection) 11/28/2011  . Vaginitis 10/08/2011    Past Surgical History:  Procedure Laterality Date  . BREAST RECONSTRUCTION Bilateral 03/24/2012   Procedure: BREAST RECONSTRUCTION WITH PLACEMENT OF SILICONE GEL IMPLANTS;  Surgeon: Crissie Reese, MD;  Location: [place];  Service: Plastics;  Laterality: Bilateral;  . [place]   X 1  . MASTECTOMY W/ SENTINEL NODE  BIOPSY  08/07/2011   Procedure: MASTECTOMY WITH SENTINEL LYMPH NODE BIOPSY;  Surgeon: Merrie Roof, MD;  Location: [place];  Service: General;  Laterality: Bilateral;  bilateral mastectomy  . MOUTH SURGERY  1997   gum surgery  . PORT-A-CATH REMOVAL  08/07/2011   Procedure: REMOVAL PORT-A-CATH;  Surgeon: Merrie Roof, MD;  Location: [place];  Service: General;  Laterality: Right;  . PORTACATH PLACEMENT  03/20/2011   Procedure: INSERTION PORT-A-CATH;  Surgeon: Merrie Roof, MD;  Location: [place];  Service: General;  Laterality: Right;  placement of port -Right Subclavian Vein  . TISSUE EXPANDER PLACEMENT  08/07/2011   Procedure: TISSUE EXPANDER;  Surgeon: Crissie Reese, MD;  Location: [place];  Service: Plastics;  Laterality: Bilateral;  BILATERAL PLACEMENT OF TISSUE EXPANDERS FOR BREAST RECONSTRUCTION  . TUBAL LIGATION     1999  . VARICOSE VEIN SURGERY      vascular and vein    There were no vitals filed for this visit.  Subjective Assessment - 04/30/17 0848    Subjective  Abigail [name] reporting some improvement in R shoulder pain levels since last visit.      Patient Stated Goals  "to have normal movement and use of R UE"    Currently in Pain?  No/denies    Pain Score  0-No pain    Multiple Pain  Sites  No                       [facility] Adult PT Treatment/Exercise - 04/30/17 0855      Shoulder Exercises: Prone   Extension  Both;Strengthening;15 reps    Extension Weight (lbs)  1    Extension Limitations  Prone "I" on green p-ball    Horizontal ABduction 1  Both;Strengthening;15 reps    Horizontal ABduction 1 Weight (lbs)  1    Horizontal ABduction 1 Limitations  prone "T" on green p-ball    Horizontal ABduction 2  Both;15 reps    Horizontal ABduction 2 Weight (lbs)  1    Horizontal ABduction 2 Limitations  Prone "Y" on green p-ball       Shoulder Exercises: Standing   External Rotation  Right;10 reps;Theraband    Theraband Level (Shoulder  External Rotation)  Level 2 (Red)    Internal Rotation  Right;10 reps;Theraband    Theraband Level (Shoulder Internal Rotation)  Level 2 (Red)      Shoulder Exercises: ROM/Strengthening   UBE (Upper Arm Bike)  L3.0 fwd/back x 3' each    Cybex Row  10 reps    Cybex Row Limitations  20# - low handles     Other ROM/Strengthening Exercises  Wall pushup plus x 15 reps; Some tactile cueing required for proper scapular motion     Other ROM/Strengthening Exercises  Serratus roll with foam roller on wall x 15 reps       Shoulder Exercises: Stretch   Other Shoulder Stretches  Standing R lats stretch on doorway x 30 sec       Manual Therapy   Manual Therapy  Soft tissue mobilization;Myofascial release    Manual therapy comments  L sidelying; prone     Soft tissue mobilization  R teres  group, infraspinatus     Myofascial Release  manual TPR R infraspinatus, teres group              PT Education - 04/30/17 0941    Education provided  Yes    Education Details  HEP update; green TB issued to pt. for row     Person(s) Educated  Patient    Methods  Explanation;Demonstration;Verbal cues;Handout    Comprehension  Verbalized understanding;Returned demonstration;Verbal cues required;Need further instruction       PT Short Term Goals - 04/16/17 0954      PT SHORT TERM GOAL #1   Title  Independent with initial HEP    Status  Achieved      PT SHORT TERM GOAL #2   Title  Pt will verbalize/demonstrate understanding of neutral spine and shoulder posture for imporved GH mechanics    Status  Achieved        PT Long Term Goals - 04/25/17 1235      PT LONG TERM GOAL #1   Title  Independent with ongoing/advanced HEP    Status  Partially Met      PT LONG TERM GOAL #2   Title  R shoulder AROM w/in 10 degrees of L w/o increased pain    Status  On-going      PT LONG TERM GOAL #3   Title  R shoulder strength >/= 4/5 to 4+/5 w/o increased pain on resistance    Status  On-going      PT LONG  TERM GOAL #4   Title  Pt will report ability to perform ADLs such as dressing and  light household chores with >/= 50% reduction in pain    Status  Partially Met            Plan - 04/30/17 0852    Clinical Impression Statement  Abigail [name] reporting some benefit from taping last visit and feels she is more easily able to reach behind back now.  Tolerated progression of prone scapular strengthening activities well today.  Required cueing for scapular retraction/depression with "Y" as she was having "impingement" pain with this movement with relief from pain following cueing.  Remains TTP in Teres group today with STM thus instructed pt. in self-ball release on wall for this area.  HEP updated.  Pt. preferring not to tape again today and ended treatment pain free thus modalities deferred.  Will continue to progress toward goals.       PT Treatment/Interventions  Patient/family education;Neuromuscular re-education;ADLs/Self Care Home Management;Therapeutic exercise;Therapeutic activities;Manual techniques;Passive range of motion;Dry needling;Taping;Iontophoresis 66m/ml Dexamethasone;Moist Heat;Cryotherapy;Vasopneumatic Device    Consulted and Agree with Plan of Care  Patient       Patient will benefit from skilled therapeutic intervention in order to improve the following deficits and impairments:  Pain, Postural dysfunction, Decreased range of motion, Impaired flexibility, Decreased strength, Increased muscle spasms, Impaired UE functional use, Decreased activity tolerance  Visit Diagnosis: Acute pain of right shoulder  Stiffness of right shoulder, not elsewhere classified  Abnormal posture  Muscle weakness (generalized)     Problem List Patient Active Problem List   Diagnosis Date Noted  . Colon cancer screening 11/26/2015  . BRCA gene mutation positive 11/21/2015  . Osteopenia 05/24/2013  . Breast cancer of upper-inner quadrant of [place] female breast [place] 11/18/2012  . Vaginal  dryness 12/03/2011  . S/P mastectomy, bilateral 12/03/2011  . Menopause 12/03/2011  . Anemia 12/03/2011  . UTI (lower urinary tract infection) 11/28/2011  . Anxiety 02/11/2011  . Insomnia 02/11/2011  . Allergic state 12/04/2010  . Preventative health care 12/04/2010  . History of mononucleosis   . DERMATITIS, ATOPIC 11/17/2010    MBess Harvest PTA 04/30/17 9:50 AM   [facility] [place] [address]Phone: [phone]  Fax:  [phone] Name: Abigail WISHERMRN: [id number] of Birth: [birth year]

## 2017-05-01 MED FILL — MONTELUKAST SOD 10 MG TAB: 10 | 30 days supply | Qty: 30 | Fill #1

## 2017-05-02 ENCOUNTER — Encounter: Payer: Self-pay | Admitting: Physical Therapy

## 2017-05-02 ENCOUNTER — Ambulatory Visit: Payer: BLUE CROSS/BLUE SHIELD | Admitting: Physical Therapy

## 2017-05-02 DIAGNOSIS — M6281 Muscle weakness (generalized): Secondary | ICD-10-CM | POA: Diagnosis not present

## 2017-05-02 DIAGNOSIS — M25511 Pain in right shoulder: Secondary | ICD-10-CM

## 2017-05-02 DIAGNOSIS — R293 Abnormal posture: Secondary | ICD-10-CM | POA: Diagnosis not present

## 2017-05-02 DIAGNOSIS — M25611 Stiffness of right shoulder, not elsewhere classified: Secondary | ICD-10-CM

## 2017-05-02 NOTE — Patient Instructions (Signed)

## 2017-05-02 NOTE — Therapy (Signed)
[facility] [address]  [place], [place], [address] Phone: [phone]   Fax:  [phone]  Physical Therapy Treatment  Patient Details  Name: Abigail [name] MRN: [id number] Date of Birth: March 02, 1965 Referring Provider: Wyatt Portela, PA-C   Encounter Date: 05/02/2017  PT End of Session - 05/02/17 0846    Visit Number  9    Number of Visits  12    Date for PT Re-Evaluation  05/16/17    Authorization Type  BCBS    PT Start Time  0846    PT Stop Time  0938    PT Time Calculation (min)  52 min    Activity Tolerance  Patient tolerated treatment well    Behavior During Therapy  [facility] for tasks assessed/performed       Past Medical History:  Diagnosis Date  . Allergic state 12/04/2010  . Allergy    seasonal- spring  . Anemia    mild  . Anxiety 02/11/2011  . BRCA gene mutation positive 11/21/2015  . Breast cancer ([place]) 06/05/11   RIGHT  BX,POSTERIOR ,SLIGHT LATERAL=FOCAL ATYPIA LOBULAR HYPERPLASIA IN BACKGROUND OF EXTENSIVE STROMAL FIBROSIS  . 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 Chicken pox as a child  . History of mononucleosis   . History of streptococcal pharyngitis   . Hot flashes   . Insomnia 02/11/2011  . Post-operative nausea and vomiting    past c-section  . Preventative health care 12/04/2010  . S/P chemotherapy, time since less than 4 weeks 05/23/2011   completed 4 doses docetaxel/cyclophosphamide  . Urticaria   . UTI (lower urinary tract infection) 11/28/2011  . Vaginitis 10/08/2011    Past Surgical History:  Procedure Laterality Date  . BREAST RECONSTRUCTION Bilateral 03/24/2012   Procedure: BREAST RECONSTRUCTION WITH PLACEMENT OF SILICONE GEL IMPLANTS;  Surgeon: Crissie Reese, MD;  Location: [facility];  Service: Plastics;  Laterality: Bilateral;  . [place]   X 1  . MASTECTOMY W/ SENTINEL NODE BIOPSY  08/07/2011   Procedure: MASTECTOMY  WITH SENTINEL LYMPH NODE BIOPSY;  Surgeon: Merrie Roof, MD;  Location: [place];  Service: General;  Laterality: Bilateral;  bilateral mastectomy  . MOUTH SURGERY  1997   gum surgery  . PORT-A-CATH REMOVAL  08/07/2011   Procedure: REMOVAL PORT-A-CATH;  Surgeon: Merrie Roof, MD;  Location: [place];  Service: General;  Laterality: Right;  . PORTACATH PLACEMENT  03/20/2011   Procedure: INSERTION PORT-A-CATH;  Surgeon: Merrie Roof, MD;  Location: [place];  Service: General;  Laterality: Right;  placement of port -Right Subclavian Vein  . TISSUE EXPANDER PLACEMENT  08/07/2011   Procedure: TISSUE EXPANDER;  Surgeon: Crissie Reese, MD;  Location: [place];  Service: Plastics;  Laterality: Bilateral;  BILATERAL PLACEMENT OF TISSUE EXPANDERS FOR BREAST RECONSTRUCTION  . TUBAL LIGATION     1999  . VARICOSE VEIN SURGERY     [place] vascular and vein    There were no vitals filed for this visit.  Subjective Assessment - 05/02/17 0846    Subjective  Abigail [name] reported she seem to be improving tremendously, feels a little stiff this morning. Still is having issues with reaching objects behind her and has an impingement feeling in the R shoulder. States the green resistance is providing a good workout with her HEP.     Patient Stated Goals  "to have normal movement and  use of R UE"    Currently in Pain?  No/denies    Pain Score  0-No pain                       [facility] Adult PT Treatment/Exercise - 05/02/17 0846      Exercises   Exercises  Shoulder      Shoulder Exercises: ROM/Strengthening   UBE (Upper Arm Bike)  L3.0 fwd/back x 3' each      Manual Therapy   Manual Therapy  Soft tissue mobilization    Manual therapy comments  L sidelying    Soft tissue mobilization  R mid deltoid; R Lat; R teres group; R infraspinatus    Myofascial Release  manual TPR R to R mid deltoid, R Lat, R infraspinatus, R teres group        Trigger Point Dry Needling - 05/02/17 0909     Consent Given?  Yes    Education Handout Provided  Yes    Muscles Treated Upper Body  Infraspinatus R Mid Deltoid, R Teres Minor; Positive Twitch Response    Infraspinatus Response  Twitch response elicited;Palpable increased muscle length             PT Short Term Goals - 04/16/17 0954      PT SHORT TERM GOAL #1   Title  Independent with initial HEP    Status  Achieved      PT SHORT TERM GOAL #2   Title  Pt will verbalize/demonstrate understanding of neutral spine and shoulder posture for imporved GH mechanics    Status  Achieved        PT Long Term Goals - 04/25/17 1235      PT LONG TERM GOAL #1   Title  Independent with ongoing/advanced HEP    Status  Partially Met      PT LONG TERM GOAL #2   Title  R shoulder AROM w/in 10 degrees of L w/o increased pain    Status  On-going      PT LONG TERM GOAL #3   Title  R shoulder strength >/= 4/5 to 4+/5 w/o increased pain on resistance    Status  On-going      PT LONG TERM GOAL #4   Title  Pt will report ability to perform ADLs such as dressing and light household chores with >/= 50% reduction in pain    Status  Partially Met            Plan - 05/02/17 0938    Clinical Impression Statement  Abigail [name] reported to Pt with no pain in R shoulder today, however stated that it is easier for her to reach behind now but still feels an impingement type of pain at times in anterior R shoulder that radiates down mid deltoid.  Upon palpation, tightness was felt in R mid deltoid, R infraspinatus, R teres minor, and R lat. Pt. was educated on the benefits of dry needling  and verbal consent was recieved. Dry needling was performed by the supervising physical therapist. R teres minor, R infraspinatus, and R mid deltoid were all dry needled, and a strong twictch was elicited.     PT Treatment/Interventions  Patient/family education;Neuromuscular re-education;ADLs/Self Care Home Management;Therapeutic exercise;Therapeutic activities;Manual  techniques;Passive range of motion;Dry needling;Taping;Iontophoresis 13m/ml Dexamethasone;Moist Heat;Cryotherapy;Vasopneumatic Device    Consulted and Agree with Plan of Care  Patient       Patient will benefit from skilled therapeutic intervention in order to improve  the following deficits and impairments:  Pain, Postural dysfunction, Decreased range of motion, Impaired flexibility, Decreased strength, Increased muscle spasms, Impaired UE functional use, Decreased activity tolerance  Visit Diagnosis: Acute pain of right shoulder  Stiffness of right shoulder, not elsewhere classified  Abnormal posture  Muscle weakness (generalized)     Problem List Patient Active Problem List   Diagnosis Date Noted  . Colon cancer screening 11/26/2015  . BRCA gene mutation positive 11/21/2015  . Osteopenia 05/24/2013  . Breast cancer of upper-inner quadrant of left female breast ([place]) 11/18/2012  . Vaginal dryness 12/03/2011  . S/P mastectomy, bilateral 12/03/2011  . Menopause 12/03/2011  . Anemia 12/03/2011  . UTI (lower urinary tract infection) 11/28/2011  . Anxiety 02/11/2011  . Insomnia 02/11/2011  . Allergic state 12/04/2010  . Preventative health care 12/04/2010  . History of mononucleosis   . DERMATITIS, ATOPIC 11/17/2010    Abigail [name], SPT 05/02/2017, 9:52 AM  [facility] [address]  [place], [place], [address] Phone: [phone]   Fax:  [phone]  Name: Abigail [name] MRN: [id number] Date of Birth: 1965/12/15

## 2017-05-08 ENCOUNTER — Ambulatory Visit (INDEPENDENT_AMBULATORY_CARE_PROVIDER_SITE_OTHER): Payer: BLUE CROSS/BLUE SHIELD | Admitting: Medical

## 2017-05-08 ENCOUNTER — Other Ambulatory Visit (HOSPITAL_COMMUNITY)
Admission: RE | Admit: 2017-05-08 | Discharge: 2017-05-08 | Disposition: A | Discharge status: Home / Self Care | Payer: BLUE CROSS/BLUE SHIELD | Source: Ambulatory Visit | Attending: Medical | Admitting: Medical

## 2017-05-08 ENCOUNTER — Encounter: Payer: Self-pay | Admitting: Medical

## 2017-05-08 ENCOUNTER — Ambulatory Visit: Payer: BLUE CROSS/BLUE SHIELD

## 2017-05-08 VITALS — HR 67 | Temp 98.2°F | Resp 16 | Ht 66.0 in | Wt 139.8 lb

## 2017-05-08 DIAGNOSIS — R82998 Other abnormal findings in urine: Secondary | ICD-10-CM

## 2017-05-08 DIAGNOSIS — R293 Abnormal posture: Secondary | ICD-10-CM

## 2017-05-08 DIAGNOSIS — M25611 Stiffness of right shoulder, not elsewhere classified: Secondary | ICD-10-CM

## 2017-05-08 DIAGNOSIS — N898 Other specified noninflammatory disorders of vagina: Secondary | ICD-10-CM

## 2017-05-08 DIAGNOSIS — R300 Dysuria: Secondary | ICD-10-CM | POA: Diagnosis not present

## 2017-05-08 DIAGNOSIS — R8290 Unspecified abnormal findings in urine: Secondary | ICD-10-CM | POA: Diagnosis not present

## 2017-05-08 DIAGNOSIS — M25511 Pain in right shoulder: Secondary | ICD-10-CM

## 2017-05-08 DIAGNOSIS — M6281 Muscle weakness (generalized): Secondary | ICD-10-CM

## 2017-05-08 DIAGNOSIS — R739 Hyperglycemia, unspecified: Secondary | ICD-10-CM | POA: Diagnosis not present

## 2017-05-08 LAB — COMPREHENSIVE METABOLIC PANEL
ALK PHOS: 55 U/L (ref 39–117)
ALT: 17 U/L (ref 0–35)
AST: 14 U/L (ref 0–37)
Albumin: 4.6 g/dL (ref 3.5–5.2)
BILIRUBIN TOTAL: 0.6 mg/dL (ref 0.2–1.2)
BUN: 15 mg/dL (ref 6–23)
CO2: 33 meq/L — AB (ref 19–32)
CREATININE: 0.85 mg/dL (ref 0.40–1.20)
Calcium: 10.1 mg/dL (ref 8.4–10.5)
Chloride: 101 mEq/L (ref 96–112)
GFR: 74.84 mL/min (ref >60.00)
Glucose, Bld: 86 mg/dL (ref 70–99)
Potassium: 3.9 mEq/L (ref 3.5–5.1)
Sodium: 139 mEq/L (ref 135–145)
TOTAL PROTEIN: 7.4 g/dL (ref 6.0–8.3)

## 2017-05-08 LAB — POC URINALSYSI DIPSTICK (AUTOMATED)
Nitrite, UA: POSITIVE
Urobilinogen, UA: 2 E.U./dL — AB
pH, UA: 6.5 (ref 5.0–8.0)

## 2017-05-08 LAB — HEMOGLOBIN A1C: Hgb A1c MFr Bld: 5.7 % (ref 4.6–6.5)

## 2017-05-08 MED ORDER — CIPROFLOXACIN HCL 500 MG PO TABS: 500.0000 mg | ORAL_TABLET | Freq: Two times a day (BID) | ORAL | 0 refills | Status: DC

## 2017-05-08 MED ORDER — FLUCONAZOLE 150 MG PO TABS: 150.0000 mg | ORAL_TABLET | Freq: Once | ORAL | 0 refills | Status: AC

## 2017-05-08 MED FILL — CIPROFLOXACIN HCL 500 MG TA: 500 | 7 days supply | Qty: 14 | Fill #0

## 2017-05-08 MED FILL — LEVOCETIRIZINE 5 MG TABLET: 5 | 30 days supply | Qty: 30 | Fill #1

## 2017-05-08 MED FILL — FLUCONAZOLE 150 MG TABS: 150 | 1 days supply | Qty: 1 | Fill #0

## 2017-05-08 NOTE — Patient Instructions (Addendum)
Your urine study finding and recent symptoms are suspicious for uti. I am prescribing cipro antibiotic pending urine culture results.  If you get yeast infection while on antibiotic making diflucan available.  Added urine anciillary studies bv and candida today.   For elevated blood sugar will get cmp and a1c today.  Follow up in 7 days or as needed

## 2017-05-08 NOTE — Therapy (Signed)
[facility] [address]  [place], [place], [address] Phone: [phone]   Fax:  [phone]  Physical Therapy Treatment  Patient Details  Name: Abigail [name] MRN: [id number] Date of Birth: 1965/12/27 Referring Provider: Wyatt Portela, PA-C   Encounter Date: 05/08/2017  PT End of Session - 05/08/17 1407    Visit Number  10    Number of Visits  12    Date for PT Re-Evaluation  05/16/17    Authorization Type  BCBS    PT Start Time  6789    PT Stop Time  1455 Ended with 10 min moist heat     PT Time Calculation (min)  53 min    Activity Tolerance  Patient tolerated treatment well    Behavior During Therapy  [facility] for tasks assessed/performed       Past Medical History:  Diagnosis Date  . Allergic state 12/04/2010  . Allergy    seasonal- spring  . Anemia    mild  . Anxiety 02/11/2011  . BRCA gene mutation positive 11/21/2015  . Breast cancer ([place]) 06/05/11   RIGHT  BX,POSTERIOR ,SLIGHT LATERAL=FOCAL ATYPIA LOBULAR HYPERPLASIA IN BACKGROUND OF EXTENSIVE STROMAL FIBROSIS  . 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 Chicken pox as a child  . History of mononucleosis   . History of streptococcal pharyngitis   . Hot flashes   . Insomnia 02/11/2011  . Post-operative nausea and vomiting    past c-section  . Preventative health care 12/04/2010  . S/P chemotherapy, time since less than 4 weeks 05/23/2011   completed 4 doses docetaxel/cyclophosphamide  . Urticaria   . UTI (lower urinary tract infection) 11/28/2011  . Vaginitis 10/08/2011    Past Surgical History:  Procedure Laterality Date  . BREAST RECONSTRUCTION Bilateral 03/24/2012   Procedure: BREAST RECONSTRUCTION WITH PLACEMENT OF SILICONE GEL IMPLANTS;  Surgeon: Crissie Reese, MD;  Location: [facility];  Service: Plastics;  Laterality: Bilateral;  . [place]   X 1  . MASTECTOMY W/ SENTINEL NODE BIOPSY   08/07/2011   Procedure: MASTECTOMY WITH SENTINEL LYMPH NODE BIOPSY;  Surgeon: Merrie Roof, MD;  Location: [facility];  Service: General;  Laterality: Bilateral;  bilateral mastectomy  . MOUTH SURGERY  1997   gum surgery  . PORT-A-CATH REMOVAL  08/07/2011   Procedure: REMOVAL PORT-A-CATH;  Surgeon: Merrie Roof, MD;  Location: [facility];  Service: General;  Laterality: Right;  . PORTACATH PLACEMENT  03/20/2011   Procedure: INSERTION PORT-A-CATH;  Surgeon: Merrie Roof, MD;  Location: [facility];  Service: General;  Laterality: Right;  placement of port -Right Subclavian Vein  . TISSUE EXPANDER PLACEMENT  08/07/2011   Procedure: TISSUE EXPANDER;  Surgeon: Crissie Reese, MD;  Location: [facility];  Service: Plastics;  Laterality: Bilateral;  BILATERAL PLACEMENT OF TISSUE EXPANDERS FOR BREAST RECONSTRUCTION  . TUBAL LIGATION     1999  . VARICOSE VEIN SURGERY     [facility]    There were no vitals filed for this visit.  Subjective Assessment - 05/08/17 1404    Subjective  Pt. reporting she "torqued shoulder" while plugging something into wall at work.      Patient Stated Goals  "to have normal movement and use of R UE"    Currently in Pain?  No/denies    Pain Score  0-No pain Pain rising to 7/10  at worse     Pain Location  Shoulder    Pain Orientation  Right    Pain Descriptors / Indicators  Radiating;Sharp    Pain Type  Acute pain    Pain Radiating Towards  radiates down R lateral deltoid     Pain Onset  More than a month ago    Pain Frequency  Intermittent    Multiple Pain Sites  No         [facility] PT Assessment - 05/08/17 1409      Observation/Other Assessments   Focus on Therapeutic Outcomes (FOTO)   62% (38% limitation)      AROM   AROM Assessment Site  Shoulder    Right/Left Shoulder  Right;Left    Right Shoulder Flexion  140 Degrees 2/10 pain at end range     Right Shoulder ABduction  131 Degrees    Right Shoulder Internal Rotation  -- Just above  belt line     Right Shoulder External Rotation  -- FER to T3    Left Shoulder Flexion  160 Degrees    Left Shoulder ABduction  152 Degrees    Left Shoulder Internal Rotation  -- FIR to T7    Left Shoulder External Rotation  -- FER to T5      Strength   Right/Left Shoulder  Right;Left    Right Shoulder Flexion  4+/5 Pain on max resistance     Right Shoulder ABduction  4/5 pain on max resistance     Right Shoulder Internal Rotation  4/5    Right Shoulder External Rotation  4/5    Left Shoulder Flexion  4+/5    Left Shoulder ABduction  4+/5    Left Shoulder Internal Rotation  4+/5    Left Shoulder External Rotation  4+/5                   [facility] Adult PT Treatment/Exercise - 05/08/17 1437      Shoulder Exercises: Supine   Protraction  20 reps;Both    Protraction Weight (lbs)  3      Shoulder Exercises: Standing   Other Standing Exercises  R scapular depression + retraction with gree TB 3" x 10 reps       Shoulder Exercises: ROM/Strengthening   UBE (Upper Arm Bike)  L3.0 fwd/back x 3' each      Shoulder Exercises: Stretch   Internal Rotation Stretch  30 seconds x 2     Internal Rotation Stretch Limitations  sleeper stretch    Other Shoulder Stretches  Standing R IR stretch with golf club behind back 5 x 10 sec hold  Improved tolerance as compared to sleeper stretch       Manual Therapy   Manual Therapy  Soft tissue mobilization;Myofascial release;Scapular mobilization    Manual therapy comments  Prone     Soft tissue mobilization  R teres group, R infraspinatus, R lat     Myofascial Release  TPR to R infraspinatus, teres group; twitch response felt; improvement in comfort reported following     Scapular Mobilization  R medial scapular glide with prolonged stretch in prone with R shoulder in flexion position anchored again therapist leg x 2 min  for infraspinatus, teres stretch              PT Education - 05/08/17 1633    Education provided  Yes    Education  Details  HEP update     Person(s) Educated  Patient  Methods  Explanation;Demonstration;Verbal cues;Handout    Comprehension  Verbalized understanding;Returned demonstration;Verbal cues required;Need further instruction       PT Short Term Goals - 04/16/17 0954      PT SHORT TERM GOAL #1   Title  Independent with initial HEP    Status  Achieved      PT SHORT TERM GOAL #2   Title  Pt will verbalize/demonstrate understanding of neutral spine and shoulder posture for imporved GH mechanics    Status  Achieved        PT Long Term Goals - 05/08/17 1411      PT LONG TERM GOAL #1   Title  Independent with ongoing/advanced HEP    Status  Partially Met      PT LONG TERM GOAL #2   Title  R shoulder AROM w/in 10 degrees of L w/o increased pain    Status  Partially Met Met for ER       PT LONG TERM GOAL #3   Title  R shoulder strength >/= 4/5 to 4+/5 w/o increased pain on resistance    Status  Partially Met some pain on resistance however met for MMT       PT LONG TERM GOAL #4   Title  Pt will report ability to perform ADLs such as dressing and light household chores with >/= 50% reduction in pain    Status  Partially Met 90% improvement in R shoulder pain with these tasks            Plan - 05/08/17 1408    Clinical Impression Statement  Pt. reporting she felt good following DN last visit.  Pt. reporting she is still limited most with reaching to back seat of car when sitting on front seat.  Does feel that she "torqued" R shoulder at work a few days ago while reaching to push plug in wall.  Feels shoulder has recovered from this incident at work.  Pt. demonstrating good progress with therapy thus far.  Able to meet strength goal with MMT however some pain on max level resistance into flexion and abduction activities.  Feels pain levels have improved ~ 90% with daily and household task.  Does want to be able to play volleyball and tennis again without pain and reports she was able  to "bump" volleyball a few days ago without pain.  Able to partially meet R shoulder AROM goal today however still limited in flexion, abduction, and most limitation into IR at this point.  HEP updated with IR stretch with wand which pt. was able to perform in treatment without significant pain.  Still with tightness/tenderness in posterior shoulder musculature today likely limiting elevation ROM.  Performed manual work to this area with some relief following.  Will continue to progress toward goals.      PT Treatment/Interventions  Patient/family education;Neuromuscular re-education;ADLs/Self Care Home Management;Therapeutic exercise;Therapeutic activities;Manual techniques;Passive range of motion;Dry needling;Taping;Iontophoresis 23m/ml Dexamethasone;Moist Heat;Cryotherapy;Vasopneumatic Device    Consulted and Agree with Plan of Care  Patient       Patient will benefit from skilled therapeutic intervention in order to improve the following deficits and impairments:  Pain, Postural dysfunction, Decreased range of motion, Impaired flexibility, Decreased strength, Increased muscle spasms, Impaired UE functional use, Decreased activity tolerance  Visit Diagnosis: Acute pain of right shoulder  Stiffness of right shoulder, not elsewhere classified  Abnormal posture  Muscle weakness (generalized)     Problem List Patient Active Problem List   Diagnosis Date Noted  .  Colon cancer screening 11/26/2015  . BRCA gene mutation positive 11/21/2015  . Osteopenia 05/24/2013  . Breast cancer of upper-inner quadrant of left female breast ([place]) 11/18/2012  . Vaginal dryness 12/03/2011  . S/P mastectomy, bilateral 12/03/2011  . Menopause 12/03/2011  . Anemia 12/03/2011  . UTI (lower urinary tract infection) 11/28/2011  . Anxiety 02/11/2011  . Insomnia 02/11/2011  . Allergic state 12/04/2010  . Preventative health care 12/04/2010  . History of mononucleosis   . DERMATITIS, ATOPIC 11/17/2010     Bess Harvest, PTA 05/08/17 4:52 PM  [facility] [address]  [place], [place], [address] Phone: [phone]   Fax:  [phone]  Name: Abigail [name] MRN: [id number] Date of Birth: 1965-05-28

## 2017-05-08 NOTE — Progress Notes (Signed)
Subjective:    Patient ID: Abigail [name], female    DOB: 1965/01/29, 52 y.o.   MRN: [id number]  HPI  Pt in for evaluation.  Pt states she had to worked a lot early this week. Pt states she used generic azo standard early in this week.  Pt states over last 3 months her urine had moderate bad odor.   Just this week on Tuesday dysuria, frequent urination and some back pain. This morning bloody urine.  She has drank a lot of water today.  No fever, no chills, no nausea or vomiting.  Moderate frequency of Uti in past.    Review of Systems  Constitutional: Negative for chills, fatigue and fever.  Respiratory: Negative for cough, chest tightness, shortness of breath and wheezing.   Cardiovascular: Negative for chest pain and palpitations.  Gastrointestinal: Negative for abdominal pain.  Genitourinary: Positive for dysuria, frequency and urgency.  Musculoskeletal: Positive for back pain.  Skin: Negative for rash.  Hematological: Negative for adenopathy. Does not bruise/bleed easily.    Past Medical History:  Diagnosis Date  . Allergic state 12/04/2010  . Allergy    seasonal- spring  . Anemia    mild  . Anxiety 02/11/2011  . BRCA gene mutation positive 11/21/2015  . Breast cancer ([place]) 06/05/11   RIGHT  BX,POSTERIOR ,SLIGHT LATERAL=FOCAL ATYPIA LOBULAR HYPERPLASIA IN BACKGROUND OF EXTENSIVE STROMAL FIBROSIS  . 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 Chicken pox as a child  . History of mononucleosis   . History of streptococcal pharyngitis   . Hot flashes   . Insomnia 02/11/2011  . Post-operative nausea and vomiting    past c-section  . Preventative health care 12/04/2010  . S/P chemotherapy, time since less than 4 weeks 05/23/2011   completed 4 doses docetaxel/cyclophosphamide  . Urticaria   . UTI (lower urinary tract infection) 11/28/2011  . Vaginitis 10/08/2011     Social History   Socioeconomic History  . Marital  status: Married    Spouse name: Not on file  . Number of children: 2  . Years of education: Not on file  . Highest education level: Not on file  Occupational History  . Occupation: [employer]    Employer: [employer]  Social Needs  . Financial resource strain: Not on file  . Food insecurity:    Worry: Not on file    Inability: Not on file  . Transportation needs:    Medical: Not on file    Non-medical: Not on file  Tobacco Use  . Smoking status: Never Smoker  . Smokeless tobacco: Never Used  Substance and Sexual Activity  . Alcohol use: Yes    Comment: occasionally- social  2 drinks  . Drug use: No  . Sexual activity: Yes    Birth control/protection: Surgical  Lifestyle  . Physical activity:    Days per week: Not on file    Minutes per session: Not on file  . Stress: Not on file  Relationships  . Social connections:    Talks on phone: Not on file    Gets together: Not on file    Attends religious service: Not on file    Active member of club or organization: Not on file    Attends meetings of clubs or organizations: Not on file    Relationship status: Not on file  . Intimate partner violence:    Fear of current or ex partner: Not on file  Emotionally abused: Not on file    Physically abused: Not on file    Forced sexual activity: Not on file  Other Topics Concern  . Not on file  Social History Narrative  . Not on file    Past Surgical History:  Procedure Laterality Date  . BREAST RECONSTRUCTION Bilateral 03/24/2012   Procedure: BREAST RECONSTRUCTION WITH PLACEMENT OF SILICONE GEL IMPLANTS;  Surgeon: Crissie Reese, MD;  Location: [facility];  Service: Plastics;  Laterality: Bilateral;  . [place]   X 1  . MASTECTOMY W/ SENTINEL NODE BIOPSY  08/07/2011   Procedure: MASTECTOMY WITH SENTINEL LYMPH NODE BIOPSY;  Surgeon: Merrie Roof, MD;  Location: [facility];  Service: General;  Laterality: Bilateral;  bilateral mastectomy  . MOUTH SURGERY  1997    gum surgery  . PORT-A-CATH REMOVAL  08/07/2011   Procedure: REMOVAL PORT-A-CATH;  Surgeon: Merrie Roof, MD;  Location: [facility];  Service: General;  Laterality: Right;  . PORTACATH PLACEMENT  03/20/2011   Procedure: INSERTION PORT-A-CATH;  Surgeon: Merrie Roof, MD;  Location: [facility];  Service: General;  Laterality: Right;  placement of port -Right Subclavian Vein  . TISSUE EXPANDER PLACEMENT  08/07/2011   Procedure: TISSUE EXPANDER;  Surgeon: Crissie Reese, MD;  Location: [facility];  Service: Plastics;  Laterality: Bilateral;  BILATERAL PLACEMENT OF TISSUE EXPANDERS FOR BREAST RECONSTRUCTION  . TUBAL LIGATION     1999  . VARICOSE VEIN SURGERY     [facility]    Family History  Adopted: Yes  Problem Relation Age of Onset  . Heart disease Mother   . Heart disease Maternal Uncle   . Other Maternal Grandmother        mad cow disease  . Cancer Other        prostate cancer, diffuse in uncles, GF    Allergies  Allergen Reactions  . Penicillins Rash    Current Outpatient Medications on File Prior to Visit  Medication Sig Dispense Refill  . anastrozole (ARIMIDEX) 1 MG tablet Take 1 tablet (1 mg total) by mouth daily. 90 tablet 4  . calcium citrate (CALCITRATE - DOSED IN MG ELEMENTAL CALCIUM) 950 MG tablet Take 600 mg of elemental calcium by mouth daily.     . ferrous sulfate 325 (65 FE) MG EC tablet Take 325 mg by mouth 3 (three) times daily with meals.    Marland Kitchen levocetirizine (XYZAL) 5 MG tablet Take 1 tablet (5 mg total) by mouth every evening. 30 tablet 11  . loratadine (CLARITIN) 10 MG tablet Take 10 mg by mouth 2 (two) times daily.     . montelukast (SINGULAIR) 10 MG tablet Take 1 tablet (10 mg total) by mouth at bedtime as needed. 30 tablet 5  . zolpidem (AMBIEN) 10 MG tablet Take 0.5 tablets (5 mg total) by mouth at bedtime as needed for sleep. For sleep, pt takes 1/3 of tablet 30 tablet 0   Current Facility-Administered Medications on File Prior to  Visit  Medication Dose Route Frequency Provider Last Rate Last Dose  . 0.9 %  sodium chloride infusion  500 mL Intravenous Continuous Pyrtle, Lajuan Lines, MD        Pulse 67   Temp 98.2 F (36.8 C) (Oral)   Resp 16   Ht 5' 6"  (1.676 m)   Wt 139 lb 12.8 oz (63.4 kg)   LMP 03/02/2011   SpO2 100%   BMI 22.56 kg/m  Objective:   Physical Exam  General Appearance- Not in acute distress.  HEENT Eyes- Scleraeral/Conjuntiva-bilat- Not Yellow. Mouth & Throat- Normal.  Chest and Lung Exam Auscultation: Breath sounds:-Normal. Adventitious sounds:- No Adventitious sounds.  Cardiovascular Auscultation:Rythm - Regular. Heart Sounds -Normal heart sounds.  Abdomen Inspection:-Inspection Normal.  Palpation/Perucssion: Palpation and Percussion of the abdomen reveal- Non Tender, No Rebound tenderness, No rigidity(Guarding) and No Palpable abdominal masses.  Liver:-Normal.  Spleen:- Normal.   Back- no cva tenderness.      Assessment & Plan:  Your urine study finding and recent symptoms are suspicious for uti. I am prescribing cipro antibiotic pending urine culture results.  If you get yeast infection while on antibiotic making diflucan available.  Added urine anciillary studies bv and candida today.   For elevated blood sugar will get cmp and a1c today.  Follow up in 7 days or as needed  [employer], [employer]

## 2017-05-09 LAB — URINE CULTURE
MICRO NUMBER: 90447792
SPECIMEN QUALITY: ADEQUATE

## 2017-05-12 ENCOUNTER — Ambulatory Visit: Payer: BLUE CROSS/BLUE SHIELD

## 2017-05-12 DIAGNOSIS — M25511 Pain in right shoulder: Secondary | ICD-10-CM | POA: Diagnosis not present

## 2017-05-12 DIAGNOSIS — M25611 Stiffness of right shoulder, not elsewhere classified: Secondary | ICD-10-CM

## 2017-05-12 DIAGNOSIS — M6281 Muscle weakness (generalized): Secondary | ICD-10-CM | POA: Diagnosis not present

## 2017-05-12 DIAGNOSIS — R293 Abnormal posture: Secondary | ICD-10-CM

## 2017-05-12 LAB — URINE CYTOLOGY ANCILLARY ONLY
Bacterial vaginitis: NEGATIVE
Candida vaginitis: NEGATIVE

## 2017-05-12 NOTE — Therapy (Addendum)
[facility] [address]  [place], [place], [address] Phone: [phone]   Fax:  [phone]  Physical Therapy Treatment  Patient Details  Name: Abigail [name] MRN: [id number] Date of Birth: February 09, 1965 Referring Provider: Wyatt Portela, PA-C   Encounter Date: 05/12/2017  PT End of Session - 05/12/17 0849    Visit Number  11    Number of Visits  12    Date for PT Re-Evaluation  05/16/17    Authorization Type  BCBS    PT Start Time  0845    PT Stop Time  0929    PT Time Calculation (min)  44 min    Activity Tolerance  Patient tolerated treatment well    Behavior During Therapy  [facility] for tasks assessed/performed       Past Medical History:  Diagnosis Date  . Allergic state 12/04/2010  . Allergy    seasonal- spring  . Anemia    mild  . Anxiety 02/11/2011  . BRCA gene mutation positive 11/21/2015  . Breast cancer ([facility]) 06/05/11   RIGHT  BX,POSTERIOR ,SLIGHT LATERAL=FOCAL ATYPIA LOBULAR HYPERPLASIA IN BACKGROUND OF EXTENSIVE STROMAL FIBROSIS  . 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 Chicken pox as a child  . History of mononucleosis   . History of streptococcal pharyngitis   . Hot flashes   . Insomnia 02/11/2011  . Post-operative nausea and vomiting    past c-section  . Preventative health care 12/04/2010  . S/P chemotherapy, time since less than 4 weeks 05/23/2011   completed 4 doses docetaxel/cyclophosphamide  . Urticaria   . UTI (lower urinary tract infection) 11/28/2011  . Vaginitis 10/08/2011    Past Surgical History:  Procedure Laterality Date  . BREAST RECONSTRUCTION Bilateral 03/24/2012   Procedure: BREAST RECONSTRUCTION WITH PLACEMENT OF SILICONE GEL IMPLANTS;  Surgeon: Crissie Reese, MD;  Location: [place];  Service: Plastics;  Laterality: Bilateral;  . [place]   X 1  . MASTECTOMY W/ SENTINEL NODE BIOPSY  08/07/2011   Procedure:  MASTECTOMY WITH SENTINEL LYMPH NODE BIOPSY;  Surgeon: Merrie Roof, MD;  Location: [place];  Service: General;  Laterality: Bilateral;  bilateral mastectomy  . MOUTH SURGERY  1997   gum surgery  . PORT-A-CATH REMOVAL  08/07/2011   Procedure: REMOVAL PORT-A-CATH;  Surgeon: Merrie Roof, MD;  Location: [place];  Service: General;  Laterality: Right;  . PORTACATH PLACEMENT  03/20/2011   Procedure: INSERTION PORT-A-CATH;  Surgeon: Merrie Roof, MD;  Location: [place];  Service: General;  Laterality: Right;  placement of port -Right Subclavian Vein  . TISSUE EXPANDER PLACEMENT  08/07/2011   Procedure: TISSUE EXPANDER;  Surgeon: Crissie Reese, MD;  Location: [place];  Service: Plastics;  Laterality: Bilateral;  BILATERAL PLACEMENT OF TISSUE EXPANDERS FOR BREAST RECONSTRUCTION  . TUBAL LIGATION     1999  . VARICOSE VEIN SURGERY     [facility]    There were no vitals filed for this visit.  Subjective Assessment - 05/12/17 0847    Subjective  Pt. reporting she has upgraded herself to the green band for all band HEP activities now without issue.      Patient Stated Goals  "to have normal movement and use of R UE"    Currently in Pain?  No/denies    Pain Score  0-No pain    Multiple Pain  Sites  No         [facility] PT Assessment - 05/12/17 0907      AROM   Right Shoulder Flexion  143 Degrees    Right Shoulder ABduction  140 Degrees                   [facility] Adult PT Treatment/Exercise - 05/12/17 0852      Self-Care   Self-Care  Other Self-Care Comments    Other Self-Care Comments   Reviewed comprehensive HEP to check for appropriateness and with HEP update with I's, T's, Y's checking for understanding       Shoulder Exercises: Prone   Extension  Both;Strengthening;10 reps    Extension Weight (lbs)  2    Extension Limitations  Prone "I" on green p-ball    Horizontal ABduction 1  Both;Strengthening;15 reps    Horizontal ABduction 1 Weight (lbs)   1    Horizontal ABduction 1 Limitations  prone "T" on green p-ball    Horizontal ABduction 2  Both;15 reps 3" hold     Horizontal ABduction 2 Weight (lbs)  1    Horizontal ABduction 2 Limitations  Prone "Y" on green p-ball       Shoulder Exercises: Standing   Other Standing Exercises  R scapular depression + retraction with gree TB 3" x 15 reps       Shoulder Exercises: ROM/Strengthening   UBE (Upper Arm Bike)  L3.5 fwd/back x 3' each with discussion on pt. pain status     Cybex Row  15 reps    Cybex Row Limitations  20# - low handles     Other ROM/Strengthening Exercises  BATCA serratus punch 20# x 15 reps       Shoulder Exercises: Stretch   Other Shoulder Stretches  Standing R IR stretch with golf club behind back 5 x 10 sec hold       Manual Therapy   Manual Therapy  Soft tissue mobilization;Myofascial release;Scapular mobilization    Manual therapy comments  Prone     Soft tissue mobilization  R teres group, R infraspinatus, R lat  no significant tenderness today                PT Short Term Goals - 04/16/17 0954      PT SHORT TERM GOAL #1   Title  Independent with initial HEP    Status  Achieved      PT SHORT TERM GOAL #2   Title  Pt will verbalize/demonstrate understanding of neutral spine and shoulder posture for imporved GH mechanics    Status  Achieved        PT Long Term Goals - 05/08/17 1411      PT LONG TERM GOAL #1   Title  Independent with ongoing/advanced HEP    Status  Partially Met      PT LONG TERM GOAL #2   Title  R shoulder AROM w/in 10 degrees of L w/o increased pain    Status  Partially Met Met for ER       PT LONG TERM GOAL #3   Title  R shoulder strength >/= 4/5 to 4+/5 w/o increased pain on resistance    Status  Partially Met some pain on resistance however met for MMT       PT LONG TERM GOAL #4   Title  Pt will report ability to perform ADLs such as dressing and light household chores with >/= 50% reduction in  pain    Status   Partially Met 90% improvement in R shoulder pain with these tasks            Plan - 05/12/17 1219    Clinical Impression Statement  Abigail [name] reporting HEP band resisted activities becoming easier at this point.  Pt. made aware that she is nearing end of POC at this point and reports 80-90% improvement in overall pain levels since starting therapy.  Reviewed comprehensive HEP with update today to prep pt. for upcoming d/c with supervising PT approving this plan and pt. in agreement with this.  Pt. able to demo improvement in AROM today with improved comfort.  Abigail [name] to see MD tomorrow for f/u and will plan for final goal testing and HEP review at upcoming visit.    PT Treatment/Interventions  Patient/family education;Neuromuscular re-education;ADLs/Self Care Home Management;Therapeutic exercise;Therapeutic activities;Manual techniques;Passive range of motion;Dry needling;Taping;Iontophoresis 13m/ml Dexamethasone;Moist Heat;Cryotherapy;Vasopneumatic Device    Consulted and Agree with Plan of Care  Patient       Patient will benefit from skilled therapeutic intervention in order to improve the following deficits and impairments:  Pain, Postural dysfunction, Decreased range of motion, Impaired flexibility, Decreased strength, Increased muscle spasms, Impaired UE functional use, Decreased activity tolerance  Visit Diagnosis: Acute pain of right shoulder  Stiffness of right shoulder, not elsewhere classified  Abnormal posture  Muscle weakness (generalized)     Problem List Patient Active Problem List   Diagnosis Date Noted  . Colon cancer screening 11/26/2015  . BRCA gene mutation positive 11/21/2015  . Osteopenia 05/24/2013  . Breast cancer of upper-inner quadrant of left female breast ([place] 11/18/2012  . Vaginal dryness 12/03/2011  . S/P mastectomy, bilateral 12/03/2011  . Menopause 12/03/2011  . Anemia 12/03/2011  . UTI (lower urinary tract infection) 11/28/2011  . Anxiety  02/11/2011  . Insomnia 02/11/2011  . Allergic state 12/04/2010  . Preventative health care 12/04/2010  . History of mononucleosis   . DERMATITIS, ATOPIC 11/17/2010    MBess Harvest PTA 05/12/17 12:26 PM  [facility] [address] [place] [address]Phone: [phone]  Fax:  [phone] Name: Abigail [name]: [id number] of Birth: 1Sep 17, 1967

## 2017-05-13 DIAGNOSIS — M25511 Pain in right shoulder: Secondary | ICD-10-CM | POA: Diagnosis not present

## 2017-05-14 ENCOUNTER — Ambulatory Visit: Payer: BLUE CROSS/BLUE SHIELD | Admitting: Physical Therapy

## 2017-05-14 ENCOUNTER — Encounter: Payer: Self-pay | Admitting: Physical Therapy

## 2017-05-14 DIAGNOSIS — R293 Abnormal posture: Secondary | ICD-10-CM

## 2017-05-14 DIAGNOSIS — M25611 Stiffness of right shoulder, not elsewhere classified: Secondary | ICD-10-CM

## 2017-05-14 DIAGNOSIS — M25511 Pain in right shoulder: Secondary | ICD-10-CM | POA: Diagnosis not present

## 2017-05-14 DIAGNOSIS — M6281 Muscle weakness (generalized): Secondary | ICD-10-CM | POA: Diagnosis not present

## 2017-05-14 NOTE — Therapy (Addendum)
[facility] [address]  [place], [place], [address] Phone: [phone]   Fax:  [phone]  Physical Therapy Treatment  Patient Details  Name: Abigail [name] MRN: [id number] Date of Birth: 1965/08/19 Referring Provider: Wyatt Portela, PA-C   Encounter Date: 05/14/2017  PT End of Session - 05/14/17 1014    Visit Number  12    Number of Visits  12    Date for PT Re-Evaluation  05/16/17    Authorization Type  BCBS    PT Start Time  1014    PT Stop Time  1053    PT Time Calculation (min)  39 min    Activity Tolerance  Patient tolerated treatment well    Behavior During Therapy  [facility] for tasks assessed/performed       Past Medical History:  Diagnosis Date  . Allergic state 12/04/2010  . Allergy    seasonal- spring  . Anemia    mild  . Anxiety 02/11/2011  . BRCA gene mutation positive 11/21/2015  . Breast cancer ([place]) 06/05/11   RIGHT  BX,POSTERIOR ,SLIGHT LATERAL=FOCAL ATYPIA LOBULAR HYPERPLASIA IN BACKGROUND OF EXTENSIVE STROMAL FIBROSIS  . 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 Chicken pox as a child  . History of mononucleosis   . History of streptococcal pharyngitis   . Hot flashes   . Insomnia 02/11/2011  . Post-operative nausea and vomiting    past c-section  . Preventative health care 12/04/2010  . S/P chemotherapy, time since less than 4 weeks 05/23/2011   completed 4 doses docetaxel/cyclophosphamide  . Urticaria   . UTI (lower urinary tract infection) 11/28/2011  . Vaginitis 10/08/2011    Past Surgical History:  Procedure Laterality Date  . BREAST RECONSTRUCTION Bilateral 03/24/2012   Procedure: BREAST RECONSTRUCTION WITH PLACEMENT OF SILICONE GEL IMPLANTS;  Surgeon: Crissie Reese, MD;  Location: [place];  Service: Plastics;  Laterality: Bilateral;  . [place]   X 1  . MASTECTOMY W/ SENTINEL NODE BIOPSY  08/07/2011   Procedure:  MASTECTOMY WITH SENTINEL LYMPH NODE BIOPSY;  Surgeon: Merrie Roof, MD;  Location: [place];  Service: General;  Laterality: Bilateral;  bilateral mastectomy  . MOUTH SURGERY  1997   gum surgery  . PORT-A-CATH REMOVAL  08/07/2011   Procedure: REMOVAL PORT-A-CATH;  Surgeon: Merrie Roof, MD;  Location: [place];  Service: General;  Laterality: Right;  . PORTACATH PLACEMENT  03/20/2011   Procedure: INSERTION PORT-A-CATH;  Surgeon: Merrie Roof, MD;  Location: [facility];  Service: General;  Laterality: Right;  placement of port -Right Subclavian Vein  . TISSUE EXPANDER PLACEMENT  08/07/2011   Procedure: TISSUE EXPANDER;  Surgeon: Crissie Reese, MD;  Location: [facility];  Service: Plastics;  Laterality: Bilateral;  BILATERAL PLACEMENT OF TISSUE EXPANDERS FOR BREAST RECONSTRUCTION  . TUBAL LIGATION     1999  . VARICOSE VEIN SURGERY     [facility]    There were no vitals filed for this visit.  Subjective Assessment - 05/14/17 1017    Subjective  Pt reports she saw the MD yesterday and states he agrees with plan to transition to HEP at this time.    Patient Stated Goals  "to have normal movement and use of R UE"    Currently in Pain?  No/denies         [facility] PT Assessment - 05/14/17  1014      Assessment   Medical Diagnosis  R shoulder OA & bursitis    Referring Provider  Wyatt Portela, PA-C    Onset Date/Surgical Date  -- Sept 2018      AROM   Right Shoulder Flexion  146 Degrees    Right Shoulder ABduction  148 Degrees    Right Shoulder Internal Rotation  -- FIR to L4    Right Shoulder External Rotation  -- FER to T3    Left Shoulder Flexion  156 Degrees    Left Shoulder ABduction  150 Degrees    Left Shoulder Internal Rotation  -- FIR to T7    Left Shoulder External Rotation  -- FER to T4      PROM   Right Shoulder Internal Rotation  64 Degrees      Strength   Overall Strength Comments  "twinge" noted with MMT resistance on R, but denies pain     Right Shoulder Flexion  4+/5    Right Shoulder ABduction  4/5    Right Shoulder Internal Rotation  4+/5    Right Shoulder External Rotation  4/5    Left Shoulder Flexion  5/5    Left Shoulder ABduction  5/5    Left Shoulder Internal Rotation  5/5    Left Shoulder External Rotation  4+/5                   [facility] Adult PT Treatment/Exercise - 05/14/17 1014      Exercises   Exercises  Shoulder      Shoulder Exercises: Standing   Horizontal ABduction  Both;15 reps;Theraband;Strengthening    Theraband Level (Shoulder Horizontal ABduction)  Level 2 (Red)    External Rotation  Both;15 reps;Theraband;Strengthening    Theraband Level (Shoulder External Rotation)  Level 2 (Red)    Extension  Both;15 reps;Theraband;Strengthening    Theraband Level (Shoulder Extension)  Level 3 (Green)    Row  Both;15 reps;Theraband;Strengthening    Theraband Level (Shoulder Row)  Level 4 (Blue)    Diagonals  Both;10 reps;Theraband;Strengthening    Theraband Level (Shoulder Diagonals)  Level 2 (Red)    Other Standing Exercises  B downward row + scap retraction/depression (lat pulldown) with blue TB 15 x 3"      Shoulder Exercises: ROM/Strengthening   UBE (Upper Arm Bike)  L3.5 fwd/back x 3' each             PT Education - 05/14/17 1017    Education provided  Yes    Education Details  HEP update    Person(s) Educated  Patient    Methods  Explanation;Demonstration;Verbal cues;Handout    Comprehension  Verbalized understanding;Returned demonstration;Verbal cues required;Need further instruction       PT Short Term Goals - 04/16/17 0954      PT SHORT TERM GOAL #1   Title  Independent with initial HEP    Status  Achieved      PT SHORT TERM GOAL #2   Title  Pt will verbalize/demonstrate understanding of neutral spine and shoulder posture for imporved GH mechanics    Status  Achieved        PT Long Term Goals - 05/14/17 1031      PT LONG TERM GOAL #1   Title  Independent with  ongoing/advanced HEP    Status  Achieved      PT LONG TERM GOAL #2   Title  R shoulder AROM w/in 10 degrees of L w/o  increased pain    Status  Partially Met Met except IR      PT LONG TERM GOAL #3   Title  R shoulder strength >/= 4/5 to 4+/5 w/o increased pain on resistance    Status  Achieved      PT LONG TERM GOAL #4   Title  Pt will report ability to perform ADLs such as dressing and light household chores with >/= 50% reduction in pain    Status  Achieved            Plan - 05/14/17 1053    Clinical Impression Statement  Malachy Mood reports MD pleased with her progress and is in agreement with planned transition to HEP. R shoulder ROM nearly equivalent to L with the exception of IR. R shoulder strength 4/5 to 4+/5 w/o pain but slight "twinge" noted on resistance. Addressed pt's questions/concerns with HEP and provide further instruction regarding progression of exercises including transition from horizontal to upright scapular stabilization/strengthening exercises, with pt verbalizing good understanding of recommended HEP continuation/progression at home. All goals met except R shoulder AROM in IR. Will proceed with transition to HEP with 30 day hold in the event that issues arise with HEP.    PT Treatment/Interventions  Patient/family education;Neuromuscular re-education;ADLs/Self Care Home Management;Therapeutic exercise;Therapeutic activities;Manual techniques;Passive range of motion;Dry needling;Taping;Iontophoresis 80m/ml Dexamethasone;Moist Heat;Cryotherapy;Vasopneumatic Device    PT Next Visit Plan  30 day hold    Consulted and Agree with Plan of Care  Patient       Patient will benefit from skilled therapeutic intervention in order to improve the following deficits and impairments:  Pain, Postural dysfunction, Decreased range of motion, Impaired flexibility, Decreased strength, Increased muscle spasms, Impaired UE functional use, Decreased activity tolerance  Visit  Diagnosis: Acute pain of right shoulder  Stiffness of right shoulder, not elsewhere classified  Abnormal posture  Muscle weakness (generalized)     Problem List Patient Active Problem List   Diagnosis Date Noted  . Colon cancer screening 11/26/2015  . BRCA gene mutation positive 11/21/2015  . Osteopenia 05/24/2013  . Breast cancer of upper-inner quadrant of left female breast ([place] 11/18/2012  . Vaginal dryness 12/03/2011  . S/P mastectomy, bilateral 12/03/2011  . Menopause 12/03/2011  . Anemia 12/03/2011  . UTI (lower urinary tract infection) 11/28/2011  . Anxiety 02/11/2011  . Insomnia 02/11/2011  . Allergic state 12/04/2010  . Preventative health care 12/04/2010  . History of mononucleosis   . DERMATITIS, ATOPIC 11/17/2010    JPercival Spanish PT, MPT 05/14/2017, 11:44 AM  [facility] [place] [address]Phone: [phone]  Fax:  [phone] Name: CTELINA KLECKLEYMRN: [id number] of Birth: 110/10/1965  PHYSICAL THERAPY DISCHARGE SUMMARY  Visits from Start of Care: 12  Current functional level related to goals / functional outcomes:   Refer to above clinical impression for status as of last visit on 04/1717. Pt was placed on hold for 30 days and has not needed to return to PT, therefore will proceed with discharge from PT for this episode.   Remaining deficits:   As above.   Education / Equipment:   HEP  Plan: Patient agrees to discharge.  Patient goals were overall met. Patient is being discharged due to being pleased with the current functional level.  ?????     JPercival Spanish PT, MPT 06/16/17, 10:59 AM  [facility] [phone]  [employer]  [place], [place], [address] Phone: [phone]   Fax:  [phone]

## 2017-06-09 MED FILL — MONTELUKAST SOD 10 MG TAB: 10 | 30 days supply | Qty: 30 | Fill #2

## 2017-06-09 MED FILL — ZOLPIDEM TARTRATE 10 MG TAB: 10 | 25 days supply | Qty: 15 | Fill #1

## 2017-07-08 MED FILL — MONTELUKAST SOD 10 MG TAB: 10 | 30 days supply | Qty: 30 | Fill #3

## 2017-07-25 MED FILL — LEVOCETIRIZINE 5 MG TABLET: 5 | 30 days supply | Qty: 30 | Fill #2

## 2017-08-18 MED FILL — ZOLPIDEM TARTRATE 10 MG TAB: 10 | 25 days supply | Qty: 15 | Fill #2

## 2017-08-18 MED FILL — MONTELUKAST SOD 10 MG TAB: 10 | 30 days supply | Qty: 30 | Fill #4

## 2017-09-15 ENCOUNTER — Other Ambulatory Visit: Payer: Self-pay | Admitting: Family Medicine

## 2017-09-15 DIAGNOSIS — T7840XD Allergy, unspecified, subsequent encounter: Secondary | ICD-10-CM

## 2017-09-15 MED FILL — MONTELUKAST SOD 10 MG TAB: 10 | 30 days supply | Qty: 30 | Fill #0

## 2017-10-07 DIAGNOSIS — Z6822 Body mass index (BMI) 22.0-22.9, adult: Secondary | ICD-10-CM | POA: Diagnosis not present

## 2017-10-07 DIAGNOSIS — Z01419 Encounter for gynecological examination (general) (routine) without abnormal findings: Secondary | ICD-10-CM | POA: Diagnosis not present

## 2017-10-09 DIAGNOSIS — Z13820 Encounter for screening for osteoporosis: Secondary | ICD-10-CM | POA: Diagnosis not present

## 2017-10-09 LAB — HM DEXA SCAN

## 2017-10-15 MED FILL — MONTELUKAST SOD 10 MG TAB: 10 | 30 days supply | Qty: 30 | Fill #1

## 2017-10-15 MED FILL — LEVOCETIRIZINE 5 MG TABLET: 5 | 30 days supply | Qty: 30 | Fill #3

## 2017-10-16 MED FILL — ZOLPIDEM TARTRATE 10 MG TAB: 10 | 25 days supply | Qty: 15 | Fill #0

## 2017-10-17 ENCOUNTER — Telehealth: Payer: Self-pay | Admitting: *Deleted

## 2017-10-17 NOTE — Telephone Encounter (Signed)
Received Bone Density results from [facility]; forwarded to provider/SLS 09/20

## 2017-11-18 DIAGNOSIS — M818 Other osteoporosis without current pathological fracture: Secondary | ICD-10-CM | POA: Diagnosis not present

## 2017-11-18 DIAGNOSIS — M8580 Other specified disorders of bone density and structure, unspecified site: Secondary | ICD-10-CM | POA: Diagnosis not present

## 2017-11-18 MED FILL — RALOXIFENE HCL 60 MG TABLET: 60 | 30 days supply | Qty: 30 | Fill #0

## 2017-11-19 MED FILL — MONTELUKAST SOD 10 MG TAB: 10 | 30 days supply | Qty: 30 | Fill #2

## 2017-11-21 ENCOUNTER — Encounter: Payer: Self-pay | Admitting: Family Medicine

## 2017-12-01 ENCOUNTER — Ambulatory Visit (INDEPENDENT_AMBULATORY_CARE_PROVIDER_SITE_OTHER): Payer: BLUE CROSS/BLUE SHIELD | Admitting: Family Medicine

## 2017-12-01 ENCOUNTER — Encounter: Payer: Self-pay | Admitting: Family Medicine

## 2017-12-01 VITALS — BP 114/76 | HR 62 | Temp 98.4°F | Resp 16 | Ht 66.0 in | Wt 139.0 lb

## 2017-12-01 DIAGNOSIS — Z0000 Encounter for general adult medical examination without abnormal findings: Secondary | ICD-10-CM

## 2017-12-01 DIAGNOSIS — R590 Localized enlarged lymph nodes: Secondary | ICD-10-CM | POA: Insufficient documentation

## 2017-12-01 DIAGNOSIS — R739 Hyperglycemia, unspecified: Secondary | ICD-10-CM

## 2017-12-01 DIAGNOSIS — D649 Anemia, unspecified: Secondary | ICD-10-CM | POA: Diagnosis not present

## 2017-12-01 DIAGNOSIS — M810 Age-related osteoporosis without current pathological fracture: Secondary | ICD-10-CM

## 2017-12-01 LAB — LIPID PANEL
CHOL/HDL RATIO: 2
Cholesterol: 155 mg/dL (ref 0–200)
HDL: 68.8 mg/dL (ref >39.00)
LDL CALC: 75 mg/dL (ref 0–99)
NonHDL: 86.02
TRIGLYCERIDES: 53 mg/dL (ref 0.0–149.0)
VLDL: 10.6 mg/dL (ref 0.0–40.0)

## 2017-12-01 LAB — COMPREHENSIVE METABOLIC PANEL
ALT: 15 U/L (ref 0–35)
AST: 18 U/L (ref 0–37)
Albumin: 4.6 g/dL (ref 3.5–5.2)
Alkaline Phosphatase: 62 U/L (ref 39–117)
BILIRUBIN TOTAL: 0.4 mg/dL (ref 0.2–1.2)
BUN: 18 mg/dL (ref 6–23)
CO2: 29 meq/L (ref 19–32)
CREATININE: 0.8 mg/dL (ref 0.40–1.20)
Calcium: 10.1 mg/dL (ref 8.4–10.5)
Chloride: 103 mEq/L (ref 96–112)
GFR: 80.09 mL/min (ref >60.00)
GLUCOSE: 90 mg/dL (ref 70–99)
Potassium: 4.1 mEq/L (ref 3.5–5.1)
Sodium: 141 mEq/L (ref 135–145)
TOTAL PROTEIN: 7 g/dL (ref 6.0–8.3)

## 2017-12-01 LAB — CBC
HCT: 40.1 % (ref 36.0–46.0)
HEMOGLOBIN: 13.3 g/dL (ref 12.0–15.0)
MCHC: 33.2 g/dL (ref 30.0–36.0)
MCV: 94.2 fl (ref 78.0–100.0)
PLATELETS: 254 10*3/uL (ref 150.0–400.0)
RBC: 4.26 Mil/uL (ref 3.87–5.11)
RDW: 12.4 % (ref 11.5–15.5)
WBC: 4.8 10*3/uL (ref 4.0–10.5)

## 2017-12-01 LAB — TSH: TSH: 1.26 u[IU]/mL (ref 0.35–4.50)

## 2017-12-01 LAB — HEMOGLOBIN A1C: HEMOGLOBIN A1C: 5.7 % (ref 4.6–6.5)

## 2017-12-01 NOTE — Assessment & Plan Note (Signed)
L>R and tender. With h/o breast cancer and persistence of lesions will proceed with ultrasound.

## 2017-12-01 NOTE — Patient Instructions (Addendum)
Shingrix is the new shingles 2 shots over 2-6 months. Check with insurance regarding coverage then call for RN visit for shot  Bone density shows osteopenia, which is thinner than normal but not as bad as osteoporosis. Recommend calcium intake of 1200 to 1500 mg daily, divided into roughly 3 doses. Best source is the diet and a single dairy serving is about 500 mg, a supplement of calcium citrate once or twice daily to balance diet is fine if not getting enough in diet. Also need Vitamin D 2000 IU caps, 1 cap daily if not already taking vitamin D. Also recommend weight baring exercise on hips and upper body to keep bones strong Preventive Care 40-64 Years, Female Preventive care refers to lifestyle choices and visits with your health care provider that can promote health and wellness. What does preventive care include?  A yearly physical exam. This is also called an annual well check.  Dental exams once or twice a year.  Routine eye exams. Ask your health care provider how often you should have your eyes checked.  Personal lifestyle choices, including: ? Daily care of your teeth and gums. ? Regular physical activity. ? Eating a healthy diet. ? Avoiding tobacco and drug use. ? Limiting alcohol use. ? Practicing safe sex. ? Taking low-dose aspirin daily starting at age 50. ? Taking vitamin and mineral supplements as recommended by your health care provider. What happens during an annual well check? The services and screenings done by your health care provider during your annual well check will depend on your age, overall health, lifestyle risk factors, and family history of disease. Counseling Your health care provider may ask you questions about your:  Alcohol use.  Tobacco use.  Drug use.  Emotional well-being.  Home and relationship well-being.  Sexual activity.  Eating habits.  Work and work Statistician.  Method of birth control.  Menstrual cycle.  Pregnancy  history.  Screening You may have the following tests or measurements:  Height, weight, and BMI.  Blood pressure.  Lipid and cholesterol levels. These may be checked every 5 years, or more frequently if you are over 66 years old.  Skin check.  Lung cancer screening. You may have this screening every year starting at age 21 if you have a 30-pack-year history of smoking and currently smoke or have quit within the past 15 years.  Fecal occult blood test (FOBT) of the stool. You may have this test every year starting at age 80.  Flexible sigmoidoscopy or colonoscopy. You may have a sigmoidoscopy every 5 years or a colonoscopy every 10 years starting at age 58.  Hepatitis C blood test.  Hepatitis B blood test.  Sexually transmitted disease (STD) testing.  Diabetes screening. This is done by checking your blood sugar (glucose) after you have not eaten for a while (fasting). You may have this done every 1-3 years.  Mammogram. This may be done every 1-2 years. Talk to your health care provider about when you should start having regular mammograms. This may depend on whether you have a family history of breast cancer.  BRCA-related cancer screening. This may be done if you have a family history of breast, ovarian, tubal, or peritoneal cancers.  Pelvic exam and Pap test. This may be done every 3 years starting at age 32. Starting at age 32, this may be done every 5 years if you have a Pap test in combination with an HPV test.  Bone density scan. This is done to screen  for osteoporosis. You may have this scan if you are at high risk for osteoporosis.  Discuss your test results, treatment options, and if necessary, the need for more tests with your health care provider. Vaccines Your health care provider may recommend certain vaccines, such as:  Influenza vaccine. This is recommended every year.  Tetanus, diphtheria, and acellular pertussis (Tdap, Td) vaccine. You may need a Td booster  every 10 years.  Varicella vaccine. You may need this if you have not been vaccinated.  Zoster vaccine. You may need this after age 40.  Measles, mumps, and rubella (MMR) vaccine. You may need at least one dose of MMR if you were born in 1957 or later. You may also need a second dose.  Pneumococcal 13-valent conjugate (PCV13) vaccine. You may need this if you have certain conditions and were not previously vaccinated.  Pneumococcal polysaccharide (PPSV23) vaccine. You may need one or two doses if you smoke cigarettes or if you have certain conditions.  Meningococcal vaccine. You may need this if you have certain conditions.  Hepatitis A vaccine. You may need this if you have certain conditions or if you travel or work in places where you may be exposed to hepatitis A.  Hepatitis B vaccine. You may need this if you have certain conditions or if you travel or work in places where you may be exposed to hepatitis B.  Haemophilus influenzae type b (Hib) vaccine. You may need this if you have certain conditions.  Talk to your health care provider about which screenings and vaccines you need and how often you need them. This information is not intended to replace advice given to you by your health care provider. Make sure you discuss any questions you have with your health care provider. Document Released: 02/10/2015 Document Revised: 10/04/2015 Document Reviewed: 11/15/2014 Elsevier Interactive Patient Education  [employer].

## 2017-12-01 NOTE — Progress Notes (Signed)
Subjective:    Patient ID: Abigail [name], female    DOB: 04/06/1965, 52 y.o.   MRN: [id number]  Chief Complaint  Patient presents with  . Annual Exam    HPI Patient is in today for   Past Medical History:  Diagnosis Date  . Allergic state 12/04/2010  . Allergy    seasonal- spring  . Anemia    mild  . Anxiety 02/11/2011  . BRCA gene mutation positive 11/21/2015  . Breast cancer ([place]) 06/05/11   RIGHT  BX,POSTERIOR ,SLIGHT LATERAL=FOCAL ATYPIA LOBULAR HYPERPLASIA IN BACKGROUND OF EXTENSIVE STROMAL FIBROSIS  . 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 Chicken pox as a child  . History of mononucleosis   . History of streptococcal pharyngitis   . Hot flashes   . Insomnia 02/11/2011  . Post-operative nausea and vomiting    past c-section  . Preventative health care 12/04/2010  . S/P chemotherapy, time since less than 4 weeks 05/23/2011   completed 4 doses docetaxel/cyclophosphamide  . Urticaria   . UTI (lower urinary tract infection) 11/28/2011  . Vaginitis 10/08/2011    Past Surgical History:  Procedure Laterality Date  . BREAST RECONSTRUCTION Bilateral 03/24/2012   Procedure: BREAST RECONSTRUCTION WITH PLACEMENT OF SILICONE GEL IMPLANTS;  Surgeon: Crissie Reese, MD;  Location: [facility];  Service: Plastics;  Laterality: Bilateral;  . [place]   X 1  . MASTECTOMY W/ SENTINEL NODE BIOPSY  08/07/2011   Procedure: MASTECTOMY WITH SENTINEL LYMPH NODE BIOPSY;  Surgeon: Merrie Roof, MD;  Location: [facility];  Service: General;  Laterality: Bilateral;  bilateral mastectomy  . MOUTH SURGERY  1997   gum surgery  . PORT-A-CATH REMOVAL  08/07/2011   Procedure: REMOVAL PORT-A-CATH;  Surgeon: Merrie Roof, MD;  Location: [facility];  Service: General;  Laterality: Right;  . PORTACATH PLACEMENT  03/20/2011   Procedure: INSERTION PORT-A-CATH;  Surgeon: Merrie Roof, MD;  Location: [facility];  Service: General;   Laterality: Right;  placement of port -Right Subclavian Vein  . TISSUE EXPANDER PLACEMENT  08/07/2011   Procedure: TISSUE EXPANDER;  Surgeon: Crissie Reese, MD;  Location: [facility];  Service: Plastics;  Laterality: Bilateral;  BILATERAL PLACEMENT OF TISSUE EXPANDERS FOR BREAST RECONSTRUCTION  . TUBAL LIGATION     1999  . VARICOSE VEIN SURGERY     [facility]    Family History  Adopted: Yes  Problem Relation Age of Onset  . Heart disease Mother   . Heart disease Maternal Uncle   . Other Maternal Grandmother        mad cow disease  . Cancer Other        prostate cancer, diffuse in uncles, GF    Social History   Socioeconomic History  . Marital status: Married    Spouse name: Not on file  . Number of children: 2  . Years of education: Not on file  . Highest education level: Not on file  Occupational History  . Occupation: [employer]    Employer: [employer]  Social Needs  . Financial resource strain: Not on file  . Food insecurity:    Worry: Not on file    Inability: Not on file  . Transportation needs:    Medical: Not on file    Non-medical: Not on file  Tobacco Use  . Smoking status: Never Smoker  . Smokeless tobacco: Never Used  Substance and Sexual Activity  . Alcohol use: Yes    Comment: occasionally- social  2 drinks  . Drug use: No  . Sexual activity: Yes    Birth control/protection: Surgical  Lifestyle  . Physical activity:    Days per week: Not on file    Minutes per session: Not on file  . Stress: Not on file  Relationships  . Social connections:    Talks on phone: Not on file    Gets together: Not on file    Attends religious service: Not on file    Active member of club or organization: Not on file    Attends meetings of clubs or organizations: Not on file    Relationship status: Not on file  . Intimate partner violence:    Fear of current or ex partner: Not on file    Emotionally abused: Not on file    Physically abused: Not on  file    Forced sexual activity: Not on file  Other Topics Concern  . Not on file  Social History Narrative  . Not on file    Outpatient Medications Prior to Visit  Medication Sig Dispense Refill  . Calcium Carb-Cholecalciferol (CALCIUM + VITAMIN D3 PO) Take by mouth.    . calcium citrate (CALCITRATE - DOSED IN MG ELEMENTAL CALCIUM) 950 MG tablet Take 600 mg of elemental calcium by mouth daily.     . Cholecalciferol (VITAMIN D3) 1000 units CAPS Take by mouth.    . levocetirizine (XYZAL) 5 MG tablet Take 1 tablet (5 mg total) by mouth every evening. 30 tablet 11  . loratadine (CLARITIN) 10 MG tablet Take 10 mg by mouth 2 (two) times daily.     . montelukast (SINGULAIR) 10 MG tablet TAKE ONE TABLET BY MOUTH EVERY NIGHT AT BEDTIME AS NEEDED 30 tablet 4  . raloxifene (EVISTA) 60 MG tablet   12  . zolpidem (AMBIEN) 10 MG tablet Take 0.5 tablets (5 mg total) by mouth at bedtime as needed for sleep. For sleep, pt takes 1/3 of tablet 30 tablet 0  . anastrozole (ARIMIDEX) 1 MG tablet Take 1 tablet (1 mg total) by mouth daily. (Patient not taking: Reported on 12/01/2017) 90 tablet 4  . ciprofloxacin (CIPRO) 500 MG tablet Take 1 tablet (500 mg total) by mouth 2 (two) times daily. 14 tablet 0  . ferrous sulfate 325 (65 FE) MG EC tablet Take 325 mg by mouth 3 (three) times daily with meals.    Marland Kitchen 0.9 %  sodium chloride infusion      No facility-administered medications prior to visit.     Allergies  Allergen Reactions  . Penicillins Rash    ROS     Objective:    Physical Exam  BP 114/76 (BP Location: Left Arm, Patient Position: Sitting, Cuff Size: Small)   Pulse 62   Temp 98.4 F (36.9 C) (Oral)   Resp 16   Ht _0  (1.676 m)   Wt 139 lb (63 kg)   LMP 03/02/2011   SpO2 99%   BMI 22.44 kg/m  Wt Readings from Last 3 Encounters:  12/01/17 139 lb (63 kg)  05/08/17 139 lb 12.8 oz (63.4 kg)  03/26/17 143 lb (64.9 kg)     Lab Results  Component Value Date   WBC 4.8 12/01/2017    HGB 13.3 12/01/2017   HCT 40.1 12/01/2017   PLT 254.0 12/01/2017   GLUCOSE 90 12/01/2017   CHOL 155 12/01/2017   TRIG 53.0 12/01/2017  HDL 68.80 12/01/2017   LDLCALC 75 12/01/2017   ALT 15 12/01/2017   AST 18 12/01/2017   NA 141 12/01/2017   K 4.1 12/01/2017   CL 103 12/01/2017   CREATININE 0.80 12/01/2017   BUN 18 12/01/2017   CO2 29 12/01/2017   TSH 1.26 12/01/2017   HGBA1C 5.7 12/01/2017    Lab Results  Component Value Date   TSH 1.26 12/01/2017   Lab Results  Component Value Date   WBC 4.8 12/01/2017   HGB 13.3 12/01/2017   HCT 40.1 12/01/2017   MCV 94.2 12/01/2017   PLT 254.0 12/01/2017   Lab Results  Component Value Date   NA 141 12/01/2017   K 4.1 12/01/2017   CHLORIDE 106 04/08/2016   CO2 29 12/01/2017   GLUCOSE 90 12/01/2017   BUN 18 12/01/2017   CREATININE 0.80 12/01/2017   BILITOT 0.4 12/01/2017   ALKPHOS 62 12/01/2017   AST 18 12/01/2017   ALT 15 12/01/2017   PROT 7.0 12/01/2017   ALBUMIN 4.6 12/01/2017   CALCIUM 10.1 12/01/2017   ANIONGAP 7 04/08/2016   EGFR 79 (L) 04/08/2016   GFR 80.09 12/01/2017   Lab Results  Component Value Date   CHOL 155 12/01/2017   Lab Results  Component Value Date   HDL 68.80 12/01/2017   Lab Results  Component Value Date   LDLCALC 75 12/01/2017   Lab Results  Component Value Date   TRIG 53.0 12/01/2017   Lab Results  Component Value Date   CHOLHDL 2 12/01/2017   Lab Results  Component Value Date   HGBA1C 5.7 12/01/2017       Assessment & Plan:   Problem List Items Addressed This Visit    Preventative health care - Primary    Patient encouraged to maintain heart healthy diet, regular exercise, adequate sleep. Consider daily probiotics. Take medications as prescribed. Given and reviewed copy of ACP documents from [place] of State and encouraged to complete and return      Relevant Orders   Lipid panel (Completed)   TSH (Completed)   Anemia    Increase leafy greens, consider increased  lean red meat and using cast iron cookware. Continue to monitor, report any concerns      Relevant Orders   CBC (Completed)   Osteoporosis    Encouraged to get adequate exercise, calcium and vitamin d intake.       Relevant Medications   raloxifene (EVISTA) 60 MG tablet   Cholecalciferol (VITAMIN D3) 1000 units CAPS   Calcium Carb-Cholecalciferol (CALCIUM + VITAMIN D3 PO)   Anterior cervical lymphadenopathy    L>R and tender. With h/o breast cancer and persistence of lesions will proceed with ultrasound.        Other Visit Diagnoses    Hyperglycemia       Relevant Orders   Hemoglobin A1c (Completed)   Comprehensive metabolic panel (Completed)   Left cervical lymphadenopathy       Relevant Orders   US SOFT TISSUE HEAD & NECK (NON-THYROID)      I have discontinued Abigail [name]'s anastrozole, ferrous sulfate, and ciprofloxacin. I am also having her maintain her zolpidem, calcium citrate, loratadine, levocetirizine, montelukast, raloxifene, Vitamin D3, and Calcium Carb-Cholecalciferol (CALCIUM + VITAMIN D3 PO). We will stop administering sodium chloride.  No orders of the defined types were placed in this encounter.    Penni Homans, MD

## 2017-12-01 NOTE — Assessment & Plan Note (Signed)
Increase leafy greens, consider increased lean red meat and using cast iron cookware. Continue to monitor, report any concerns 

## 2017-12-01 NOTE — Progress Notes (Signed)
Subjective:    Patient ID: Abigail [name], female    DOB: October 25, 1965, 52 y.o.   MRN: [id number]  Chief Complaint  Patient presents with  . Annual Exam    HPI Patient is in today for annual preventative exam and overall she feels well.no recent febrile illness or hospitalizations. Is staying active and maintains a heart healthy diet most days. Denies CP/palp/SOB/HA/congestion/fevers/GI or GU c/o. Taking meds as prescribed. Does well with heart healthy diet and continues to work full time. Has noted increased cervical lymphadenopathy for a couple of weeks and is more notable and slightly tende on left.   Past Medical History:  Diagnosis Date  . Allergic state 12/04/2010  . Allergy    seasonal- spring  . Anemia    mild  . Anxiety 02/11/2011  . BRCA gene mutation positive 11/21/2015  . Breast cancer ([place]) 06/05/11   RIGHT  BX,POSTERIOR ,SLIGHT LATERAL=FOCAL ATYPIA LOBULAR HYPERPLASIA IN BACKGROUND OF EXTENSIVE STROMAL FIBROSIS  . 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 Chicken pox as a child  . History of mononucleosis   . History of streptococcal pharyngitis   . Hot flashes   . Insomnia 02/11/2011  . Post-operative nausea and vomiting    past c-section  . Preventative health care 12/04/2010  . S/P chemotherapy, time since less than 4 weeks 05/23/2011   completed 4 doses docetaxel/cyclophosphamide  . Urticaria   . UTI (lower urinary tract infection) 11/28/2011  . Vaginitis 10/08/2011    Past Surgical History:  Procedure Laterality Date  . BREAST RECONSTRUCTION Bilateral 03/24/2012   Procedure: BREAST RECONSTRUCTION WITH PLACEMENT OF SILICONE GEL IMPLANTS;  Surgeon: Crissie Reese, MD;  Location: [facility];  Service: Plastics;  Laterality: Bilateral;  . [place]   X 1  . MASTECTOMY W/ SENTINEL NODE BIOPSY  08/07/2011   Procedure: MASTECTOMY WITH SENTINEL LYMPH NODE BIOPSY;  Surgeon: Merrie Roof, MD;  Location: [facility];   Service: General;  Laterality: Bilateral;  bilateral mastectomy  . MOUTH SURGERY  1997   gum surgery  . PORT-A-CATH REMOVAL  08/07/2011   Procedure: REMOVAL PORT-A-CATH;  Surgeon: Merrie Roof, MD;  Location: [facility];  Service: General;  Laterality: Right;  . PORTACATH PLACEMENT  03/20/2011   Procedure: INSERTION PORT-A-CATH;  Surgeon: Merrie Roof, MD;  Location: [facility];  Service: General;  Laterality: Right;  placement of port -Right Subclavian Vein  . TISSUE EXPANDER PLACEMENT  08/07/2011   Procedure: TISSUE EXPANDER;  Surgeon: Crissie Reese, MD;  Location: [facility];  Service: Plastics;  Laterality: Bilateral;  BILATERAL PLACEMENT OF TISSUE EXPANDERS FOR BREAST RECONSTRUCTION  . TUBAL LIGATION     1999  . VARICOSE VEIN SURGERY     [facility]    Family History  Adopted: Yes  Problem Relation Age of Onset  . Heart disease Mother   . Heart disease Maternal Uncle   . Other Maternal Grandmother        mad cow disease  . Cancer Other        prostate cancer, diffuse in uncles, GF    Social History   Socioeconomic History  . Marital status: Married    Spouse name: Not on file  . Number of children: 2  . Years of education: Not on file  . Highest education level: Not on file  Occupational History  . Occupation: [employer]    Employer: [employer]  Social Needs  . Financial resource strain: Not on file  . Food insecurity:    Worry: Not on file    Inability: Not on file  . Transportation needs:    Medical: Not on file    Non-medical: Not on file  Tobacco Use  . Smoking status: Never Smoker  . Smokeless tobacco: Never Used  Substance and Sexual Activity  . Alcohol use: Yes    Comment: occasionally- social  2 drinks  . Drug use: No  . Sexual activity: Yes    Birth control/protection: Surgical  Lifestyle  . Physical activity:    Days per week: Not on file    Minutes per session: Not on file  . Stress: Not on file  Relationships  .  Social connections:    Talks on phone: Not on file    Gets together: Not on file    Attends religious service: Not on file    Active member of club or organization: Not on file    Attends meetings of clubs or organizations: Not on file    Relationship status: Not on file  . Intimate partner violence:    Fear of current or ex partner: Not on file    Emotionally abused: Not on file    Physically abused: Not on file    Forced sexual activity: Not on file  Other Topics Concern  . Not on file  Social History Narrative  . Not on file    Outpatient Medications Prior to Visit  Medication Sig Dispense Refill  . Calcium Carb-Cholecalciferol (CALCIUM + VITAMIN D3 PO) Take by mouth.    . calcium citrate (CALCITRATE - DOSED IN MG ELEMENTAL CALCIUM) 950 MG tablet Take 600 mg of elemental calcium by mouth daily.     . Cholecalciferol (VITAMIN D3) 1000 units CAPS Take by mouth.    . levocetirizine (XYZAL) 5 MG tablet Take 1 tablet (5 mg total) by mouth every evening. 30 tablet 11  . loratadine (CLARITIN) 10 MG tablet Take 10 mg by mouth 2 (two) times daily.     . montelukast (SINGULAIR) 10 MG tablet TAKE ONE TABLET BY MOUTH EVERY NIGHT AT BEDTIME AS NEEDED 30 tablet 4  . raloxifene (EVISTA) 60 MG tablet   12  . zolpidem (AMBIEN) 10 MG tablet Take 0.5 tablets (5 mg total) by mouth at bedtime as needed for sleep. For sleep, pt takes 1/3 of tablet 30 tablet 0  . anastrozole (ARIMIDEX) 1 MG tablet Take 1 tablet (1 mg total) by mouth daily. (Patient not taking: Reported on 12/01/2017) 90 tablet 4  . ciprofloxacin (CIPRO) 500 MG tablet Take 1 tablet (500 mg total) by mouth 2 (two) times daily. 14 tablet 0  . ferrous sulfate 325 (65 FE) MG EC tablet Take 325 mg by mouth 3 (three) times daily with meals.    Marland Kitchen 0.9 %  sodium chloride infusion      No facility-administered medications prior to visit.     Allergies  Allergen Reactions  . Penicillins Rash    Review of Systems  Constitutional: Negative  for chills, fever and malaise/fatigue.  HENT: Negative for congestion and hearing loss.   Eyes: Negative for discharge.  Respiratory: Negative for cough, sputum production and shortness of breath.   Cardiovascular: Negative for chest pain, palpitations and leg swelling.  Gastrointestinal: Negative for abdominal pain, blood in stool, constipation, diarrhea, heartburn, nausea and vomiting.  Genitourinary: Negative for dysuria, frequency, hematuria and urgency.  Musculoskeletal: Negative for  back pain, falls and myalgias.  Skin: Negative for rash.  Neurological: Negative for dizziness, sensory change, loss of consciousness, weakness and headaches.  Endo/Heme/Allergies: Negative for environmental allergies. Does not bruise/bleed easily.  Psychiatric/Behavioral: Negative for depression and suicidal ideas. The patient is not nervous/anxious and does not have insomnia.        Objective:    Physical Exam  Constitutional: She is oriented to person, place, and time. No distress.  HENT:  Head: Normocephalic and atraumatic.  Right Ear: External ear normal.  Left Ear: External ear normal.  Nose: Nose normal.  Mouth/Throat: Oropharynx is clear and moist. No oropharyngeal exudate.  Eyes: Pupils are equal, round, and reactive to light. Conjunctivae are normal. Right eye exhibits no discharge. Left eye exhibits no discharge. No scleral icterus.  Neck: Normal range of motion. Neck supple. No tracheal deviation present. No thyromegaly present.  Cardiovascular: Normal rate, regular rhythm, normal heart sounds and intact distal pulses.  No murmur heard. Pulmonary/Chest: Effort normal and breath sounds normal. No respiratory distress. She has no wheezes. She has no rales.  Abdominal: Soft. Bowel sounds are normal. She exhibits no distension and no mass. There is no tenderness.  Musculoskeletal: Normal range of motion. She exhibits no edema or tenderness.  Lymphadenopathy:    She has cervical adenopathy.    Neurological: She is alert and oriented to person, place, and time. She has normal reflexes. She displays normal reflexes. No cranial nerve deficit. Coordination normal.  Skin: Skin is warm and dry. No rash noted. She is not diaphoretic.    BP 114/76 (BP Location: Left Arm, Patient Position: Sitting, Cuff Size: Small)   Pulse 62   Temp 98.4 F (36.9 C) (Oral)   Resp 16   Ht _0  (1.676 m)   Wt 139 lb (63 kg)   LMP 03/02/2011   SpO2 99%   BMI 22.44 kg/m  Wt Readings from Last 3 Encounters:  12/01/17 139 lb (63 kg)  05/08/17 139 lb 12.8 oz (63.4 kg)  03/26/17 143 lb (64.9 kg)     Lab Results  Component Value Date   WBC 4.8 12/01/2017   HGB 13.3 12/01/2017   HCT 40.1 12/01/2017   PLT 254.0 12/01/2017   GLUCOSE 90 12/01/2017   CHOL 155 12/01/2017   TRIG 53.0 12/01/2017   HDL 68.80 12/01/2017   LDLCALC 75 12/01/2017   ALT 15 12/01/2017   AST 18 12/01/2017   NA 141 12/01/2017   K 4.1 12/01/2017   CL 103 12/01/2017   CREATININE 0.80 12/01/2017   BUN 18 12/01/2017   CO2 29 12/01/2017   TSH 1.26 12/01/2017   HGBA1C 5.7 12/01/2017    Lab Results  Component Value Date   TSH 1.26 12/01/2017   Lab Results  Component Value Date   WBC 4.8 12/01/2017   HGB 13.3 12/01/2017   HCT 40.1 12/01/2017   MCV 94.2 12/01/2017   PLT 254.0 12/01/2017   Lab Results  Component Value Date   NA 141 12/01/2017   K 4.1 12/01/2017   CHLORIDE 106 04/08/2016   CO2 29 12/01/2017   GLUCOSE 90 12/01/2017   BUN 18 12/01/2017   CREATININE 0.80 12/01/2017   BILITOT 0.4 12/01/2017   ALKPHOS 62 12/01/2017   AST 18 12/01/2017   ALT 15 12/01/2017   PROT 7.0 12/01/2017   ALBUMIN 4.6 12/01/2017   CALCIUM 10.1 12/01/2017   ANIONGAP 7 04/08/2016   EGFR 79 (L) 04/08/2016   GFR 80.09 12/01/2017   Lab  Results  Component Value Date   CHOL 155 12/01/2017   Lab Results  Component Value Date   HDL 68.80 12/01/2017   Lab Results  Component Value Date   LDLCALC 75 12/01/2017   Lab  Results  Component Value Date   TRIG 53.0 12/01/2017   Lab Results  Component Value Date   CHOLHDL 2 12/01/2017   Lab Results  Component Value Date   HGBA1C 5.7 12/01/2017       Assessment & Plan:   Problem List Items Addressed This Visit    Preventative health care - Primary    Patient encouraged to maintain heart healthy diet, regular exercise, adequate sleep. Consider daily probiotics. Take medications as prescribed. Given and reviewed copy of ACP documents from [place] of State and encouraged to complete and return      Relevant Orders   Lipid panel (Completed)   TSH (Completed)   Anemia    Increase leafy greens, consider increased lean red meat and using cast iron cookware. Continue to monitor, report any concerns      Relevant Orders   CBC (Completed)   Osteoporosis    Encouraged to get adequate exercise, calcium and vitamin d intake.       Relevant Medications   raloxifene (EVISTA) 60 MG tablet   Cholecalciferol (VITAMIN D3) 1000 units CAPS   Calcium Carb-Cholecalciferol (CALCIUM + VITAMIN D3 PO)   Anterior cervical lymphadenopathy    L>R and tender. With h/o breast cancer and persistence of lesions will proceed with ultrasound.        Other Visit Diagnoses    Hyperglycemia       Relevant Orders   Hemoglobin A1c (Completed)   Comprehensive metabolic panel (Completed)   Left cervical lymphadenopathy       Relevant Orders   US SOFT TISSUE HEAD & NECK (NON-THYROID)      I have discontinued Fallynn A. Meckel's anastrozole, ferrous sulfate, and ciprofloxacin. I am also having her maintain her zolpidem, calcium citrate, loratadine, levocetirizine, montelukast, raloxifene, Vitamin D3, and Calcium Carb-Cholecalciferol (CALCIUM + VITAMIN D3 PO). We will stop administering sodium chloride.  No orders of the defined types were placed in this encounter.    Penni Homans, MD

## 2017-12-01 NOTE — Assessment & Plan Note (Addendum)
Encouraged to get adequate exercise, calcium and vitamin d intake 

## 2017-12-01 NOTE — Assessment & Plan Note (Signed)
Patient encouraged to maintain heart healthy diet, regular exercise, adequate sleep. Consider daily probiotics. Take medications as prescribed. Given and reviewed copy of ACP documents from [place] Secretary of State and encouraged to complete and return 

## 2017-12-10 ENCOUNTER — Ambulatory Visit (HOSPITAL_BASED_OUTPATIENT_CLINIC_OR_DEPARTMENT_OTHER)
Admission: RE | Admit: 2017-12-10 | Discharge: 2017-12-10 | Disposition: A | Discharge status: Home / Self Care | Payer: BLUE CROSS/BLUE SHIELD | Source: Ambulatory Visit | Attending: Family Medicine | Admitting: Family Medicine

## 2017-12-10 DIAGNOSIS — R590 Localized enlarged lymph nodes: Secondary | ICD-10-CM | POA: Insufficient documentation

## 2017-12-15 MED FILL — RALOXIFENE HCL 60 MG TABLET: 60 | 30 days supply | Qty: 30 | Fill #1

## 2017-12-15 MED FILL — LEVOCETIRIZINE 5 MG TABLET: 5 | 30 days supply | Qty: 30 | Fill #4

## 2018-01-06 MED FILL — ZOLPIDEM TARTRATE 10 MG TAB: 10 | 25 days supply | Qty: 15 | Fill #1

## 2018-01-16 MED FILL — MONTELUKAST SOD 10 MG TAB: 10 | 30 days supply | Qty: 30 | Fill #3

## 2018-01-16 MED FILL — RALOXIFENE HCL 60 MG TABS: 60 | 30 days supply | Qty: 30 | Fill #2

## 2018-01-16 MED FILL — LEVOCETIRIZINE 5 MG TABLET: 5 | 30 days supply | Qty: 30 | Fill #5

## 2018-02-18 MED FILL — RALOXIFENE HCL 60 MG TABS: 60 | 30 days supply | Qty: 30 | Fill #3

## 2018-03-03 MED FILL — LEVOCETIRIZINE 5 MG TABLET: 5 | 30 days supply | Qty: 30 | Fill #6

## 2018-03-16 MED FILL — ZOLPIDEM TARTRATE 10 MG TAB: 10 | 25 days supply | Qty: 15 | Fill #2

## 2018-03-16 MED FILL — RALOXIFENE HCL 60 MG TABS: 60 | 30 days supply | Qty: 30 | Fill #4

## 2018-04-04 ENCOUNTER — Encounter: Payer: Self-pay | Admitting: Emergency Medicine

## 2018-04-04 ENCOUNTER — Other Ambulatory Visit: Payer: Self-pay

## 2018-04-04 ENCOUNTER — Emergency Department (INDEPENDENT_AMBULATORY_CARE_PROVIDER_SITE_OTHER)
Admission: EM | Admit: 2018-04-04 | Discharge: 2018-04-04 | Disposition: A | Discharge status: Home / Self Care | Payer: BLUE CROSS/BLUE SHIELD | Source: Home / Self Care

## 2018-04-04 DIAGNOSIS — R195 Other fecal abnormalities: Secondary | ICD-10-CM

## 2018-04-04 DIAGNOSIS — R140 Abdominal distension (gaseous): Secondary | ICD-10-CM | POA: Diagnosis not present

## 2018-04-04 DIAGNOSIS — K219 Gastro-esophageal reflux disease without esophagitis: Secondary | ICD-10-CM

## 2018-04-04 LAB — COMPLETE METABOLIC PANEL WITH GFR
AG Ratio: 1.9 (calc) (ref 1.0–2.5)
ALT: 34 U/L — ABNORMAL HIGH (ref 6–29)
AST: 39 U/L — ABNORMAL HIGH (ref 10–35)
Albumin: 4.7 g/dL (ref 3.6–5.1)
Alkaline phosphatase (APISO): 61 U/L (ref 37–153)
BUN: 20 mg/dL (ref 7–25)
CO2: 30 mmol/L (ref 20–32)
Calcium: 9.8 mg/dL (ref 8.6–10.4)
Chloride: 104 mmol/L (ref 98–110)
Creat: 0.76 mg/dL (ref 0.50–1.05)
GFR, Est African American: 105 mL/min/{1.73_m2} (ref >=60)
GFR, Est Non African American: 90 mL/min/{1.73_m2} (ref >=60)
Globulin: 2.5 g/dL (calc) (ref 1.9–3.7)
Glucose, Bld: 99 mg/dL (ref 65–99)
Potassium: 4.1 mmol/L (ref 3.5–5.3)
Sodium: 141 mmol/L (ref 135–146)
Total Bilirubin: 0.5 mg/dL (ref 0.2–1.2)
Total Protein: 7.2 g/dL (ref 6.1–8.1)

## 2018-04-04 LAB — POCT CBC W AUTO DIFF (K'VILLE URGENT CARE)

## 2018-04-04 LAB — POCT URINALYSIS DIP (MANUAL ENTRY)
Bilirubin, UA: NEGATIVE
Glucose, UA: NEGATIVE mg/dL
Ketones, POC UA: NEGATIVE mg/dL
Nitrite, UA: NEGATIVE
Protein Ur, POC: NEGATIVE mg/dL
Spec Grav, UA: 1.02 (ref 1.010–1.025)
Urobilinogen, UA: 0.2 E.U./dL
pH, UA: 5.5 (ref 5.0–8.0)

## 2018-04-04 LAB — POCT INFLUENZA A/B
Influenza A, POC: NEGATIVE
Influenza B, POC: NEGATIVE

## 2018-04-04 LAB — LIPASE: Lipase: 12 U/L (ref 7–60)

## 2018-04-04 MED ORDER — FAMOTIDINE 40 MG PO TABS
40.0000 mg | ORAL_TABLET | Freq: Once | ORAL | Status: AC
  Administered 2018-04-04: 40 mg via ORAL

## 2018-04-04 NOTE — ED Provider Notes (Signed)
[facility]    CSN: [id number] Arrival date & time: 04/04/18  1108     History   Chief Complaint Chief Complaint  Patient presents with  . GI Problem  . Gastroesophageal Reflux    HPI Abigail [name] is a 53 y.o. female.   HPI Abigail [name] is a 53 y.o. female presenting to UC with c/o sudden onset acid reflux sensation in her throat that started 2 days ago with associated abdominal bloating, loose stool, constant flatus and gas pains.  Last night she developed a subjective fever with fatigue.  She took Tylenol earlier today with some relief. No known sick contacts. She does not recall eating any bad food.  She had her ovaries removed a few years ago but no other abdominal surgeries. No hx of diverticulitis. Pt works in the NICU and wants to make sure she doesn't bring anything back to work with her on Monday.denies recorded fever. Denies urinary symptoms. Denies chest pain or SOB. No hx of CAD.    Past Medical History:  Diagnosis Date  . Allergic state 12/04/2010  . Allergy    seasonal- spring  . Anemia    mild  . Anxiety 02/11/2011  . BRCA gene mutation positive 11/21/2015  . Breast cancer ([place]) 06/05/11   RIGHT  BX,POSTERIOR ,SLIGHT LATERAL=FOCAL ATYPIA LOBULAR HYPERPLASIA IN BACKGROUND OF EXTENSIVE STROMAL FIBROSIS  . 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 Chicken pox as a child  . History of mononucleosis   . History of streptococcal pharyngitis   . Hot flashes   . Insomnia 02/11/2011  . Post-operative nausea and vomiting    past c-section  . Preventative health care 12/04/2010  . S/P chemotherapy, time since less than 4 weeks 05/23/2011   completed 4 doses docetaxel/cyclophosphamide  . Urticaria   . UTI (lower urinary tract infection) 11/28/2011  . Vaginitis 10/08/2011    Patient Active Problem List   Diagnosis Date Noted  . Anterior cervical lymphadenopathy 12/01/2017  . Colon cancer screening  11/26/2015  . BRCA gene mutation positive 11/21/2015  . Osteoporosis 05/24/2013  . Breast cancer of upper-inner quadrant of left female breast ([place]) 11/18/2012  . Vaginal dryness 12/03/2011  . S/P mastectomy, bilateral 12/03/2011  . Menopause 12/03/2011  . Anemia 12/03/2011  . UTI (lower urinary tract infection) 11/28/2011  . Anxiety 02/11/2011  . Insomnia 02/11/2011  . Allergy 12/04/2010  . Preventative health care 12/04/2010  . History of mononucleosis   . DERMATITIS, ATOPIC 11/17/2010    Past Surgical History:  Procedure Laterality Date  . BREAST RECONSTRUCTION Bilateral 03/24/2012   Procedure: BREAST RECONSTRUCTION WITH PLACEMENT OF SILICONE GEL IMPLANTS;  Surgeon: Crissie Reese, MD;  Location: [facility];  Service: Plastics;  Laterality: Bilateral;  . [place]   X 1  . MASTECTOMY W/ SENTINEL NODE BIOPSY  08/07/2011   Procedure: MASTECTOMY WITH SENTINEL LYMPH NODE BIOPSY;  Surgeon: Merrie Roof, MD;  Location: [facility];  Service: General;  Laterality: Bilateral;  bilateral mastectomy  . MOUTH SURGERY  1997   gum surgery  . PORT-A-CATH REMOVAL  08/07/2011   Procedure: REMOVAL PORT-A-CATH;  Surgeon: Merrie Roof, MD;  Location: [facility];  Service: General;  Laterality: Right;  . PORTACATH PLACEMENT  03/20/2011   Procedure: INSERTION PORT-A-CATH;  Surgeon: Merrie Roof, MD;  Location: [facility];  Service: General;  Laterality: Right;  placement of port -Right Subclavian Vein  .  TISSUE EXPANDER PLACEMENT  08/07/2011   Procedure: TISSUE EXPANDER;  Surgeon: Crissie Reese, MD;  Location: [facility];  Service: Plastics;  Laterality: Bilateral;  BILATERAL PLACEMENT OF TISSUE EXPANDERS FOR BREAST RECONSTRUCTION  . TUBAL LIGATION     1999  . VARICOSE VEIN SURGERY     [facility]    OB History    Gravida  2   Para  2   Term      Preterm      AB      Living        SAB      TAB      Ectopic      Multiple      Live Births            Obstetric Comments  1st mensus age 13, age 53 first live birth last mensus 02/23/11, birth control pills age [age]         Home Medications    Prior to Admission medications   Medication Sig Start Date End Date Taking? Authorizing Provider  Calcium Carb-Cholecalciferol (CALCIUM + VITAMIN D3 PO) Take by mouth.    [provider]  calcium citrate (CALCITRATE - DOSED IN MG ELEMENTAL CALCIUM) 950 MG tablet Take 600 mg of elemental calcium by mouth daily.     [provider]  Cholecalciferol (VITAMIN D3) 1000 units CAPS Take by mouth.    [provider]  levocetirizine (XYZAL) 5 MG tablet Take 1 tablet (5 mg total) by mouth every evening. 03/26/17   Shelda Pal, DO  loratadine (CLARITIN) 10 MG tablet Take 10 mg by mouth 2 (two) times daily.     [provider]  montelukast (SINGULAIR) 10 MG tablet TAKE ONE TABLET BY MOUTH EVERY NIGHT AT BEDTIME AS NEEDED 09/15/17   [name], MD  raloxifene (EVISTA) 60 MG tablet  11/18/17   [provider]  zolpidem (AMBIEN) 10 MG tablet Take 0.5 tablets (5 mg total) by mouth at bedtime as needed for sleep. For sleep, pt takes 1/3 of tablet 11/10/14   Magrinat, Virgie Dad, MD    Family History Family History  Adopted: Yes  Problem Relation Age of Onset  . Heart disease Mother   . Heart disease Maternal Uncle   . Other Maternal Grandmother        mad cow disease  . Cancer Other        prostate cancer, diffuse in uncles, GF    Social History Social History   Tobacco Use  . Smoking status: Never Smoker  . Smokeless tobacco: Never Used  Substance Use Topics  . Alcohol use: Yes    Comment: occasionally- social  2 drinks  . Drug use: No     Allergies   Penicillins   Review of Systems Review of Systems  Constitutional: Positive for appetite change ( decreased) and fever.  HENT: Negative for congestion and sore throat.   Respiratory: Negative for cough and shortness of breath.     Cardiovascular: Negative for chest pain and palpitations.  Gastrointestinal: Positive for abdominal distention (bloating), abdominal pain and diarrhea ( loose). Negative for nausea and vomiting.  Genitourinary: Negative for dysuria, flank pain and frequency.  Musculoskeletal: Negative for back pain and myalgias.  Neurological: Negative for dizziness, light-headedness and headaches.     Physical Exam Triage Vital Signs ED Triage Vitals  Enc Vitals Group     BP 04/04/18 1125 127/86     Pulse Rate 04/04/18 1125  94     Resp --      Temp 04/04/18 1125 98.7 F (37.1 C)     Temp Source 04/04/18 1125 Oral     SpO2 04/04/18 1125 97 %     Weight 04/04/18 1129 139 lb 1.9 oz (63.1 kg)     Height 04/04/18 1129 5' 6"  (1.676 m)     Head Circumference --      Peak Flow --      Pain Score 04/04/18 1128 2     Pain Loc --      Pain Edu? --      Excl. in [place]? --    No data found.  Updated Vital Signs BP 127/86 (BP Location: Left Arm)   Pulse 94   Temp 98.7 F (37.1 C) (Oral)   Ht 5' 6"  (1.676 m)   Wt 139 lb 1.9 oz (63.1 kg)   LMP 03/02/2011   SpO2 97%   BMI 22.45 kg/m   Visual Acuity Right Eye Distance:   Left Eye Distance:   Bilateral Distance:    Right Eye Near:   Left Eye Near:    Bilateral Near:     Physical Exam Vitals signs and nursing note reviewed.  Constitutional:      Appearance: Normal appearance. She is well-developed.  HENT:     Head: Normocephalic and atraumatic.     Right Ear: Tympanic membrane normal.     Left Ear: Tympanic membrane normal.     Nose: Nose normal.     Mouth/Throat:     Mouth: Mucous membranes are moist.  Neck:     Musculoskeletal: Normal range of motion.  Cardiovascular:     Rate and Rhythm: Normal rate and regular rhythm.  Pulmonary:     Effort: Pulmonary effort is normal. No respiratory distress.     Breath sounds: Normal breath sounds. No stridor. No wheezing or rhonchi.  Abdominal:     General: Bowel sounds are normal. There is no  distension.     Palpations: Abdomen is soft. There is no mass.     Tenderness: There is no abdominal tenderness. There is no right CVA tenderness or left CVA tenderness.  Musculoskeletal: Normal range of motion.  Skin:    General: Skin is warm and dry.  Neurological:     Mental Status: She is alert and oriented to person, place, and time.  Psychiatric:        Behavior: Behavior normal.      UC Treatments / Results  Labs (all labs ordered are listed, but only abnormal results are displayed) Labs Reviewed  POCT URINALYSIS DIP (MANUAL ENTRY) - Abnormal; Notable for the following components:      Result Value   Blood, UA trace-intact (*)    Leukocytes, UA Trace (*)    All other components within normal limits  URINE CULTURE  COMPLETE METABOLIC PANEL WITH GFR  LIPASE  POCT INFLUENZA A/B  POCT CBC W AUTO DIFF ([facility])    EKG None  Radiology No results found.  Procedures Procedures (including critical care time)  Medications Ordered in UC Medications  famotidine (PEPCID) tablet 40 mg (40 mg Oral Given 04/04/18 1215)    Initial Impression / Assessment and Plan / UC Course  I have reviewed the triage vital signs and the nursing notes.  Pertinent labs & imaging results that were available during my care of the patient were reviewed by me and considered in my medical decision making (see chart for  details).     UA: no definite UTI Culture sent CBC: unremarkable CMP and lipase pending Symptoms most likely due to viral gastritis Will tx with Pepcid  Pt has omeprazole at home She declined anti-nausea medication Encouraged f/u with PCP  Discussed symptoms that warrant emergent care in the ED.   Final Clinical Impressions(s) / UC Diagnoses   Final diagnoses:  Abdominal bloating  Loose stools  Gastroesophageal reflux disease, esophagitis presence not specified     Discharge Instructions      Be sure to get a lot of rest and stay well hydrated with  sports drinks, water, diluted juices, and clear sodas.  Avoid fried fatty food, spicy food, and milk as these foods can cause worsening stomach upset.   Please follow up with family medicine in 2-3 days if not improving. Call 911 or go to the emergency department if symptoms significantly worsening.     ED Prescriptions    None     Controlled Substance Prescriptions [place] Controlled Substance Registry consulted? Not Applicable   Tyrell Antonio 04/04/18 1304

## 2018-04-04 NOTE — ED Triage Notes (Signed)
Here with multiple sx's- GI upset and sudden acid reflux that started 2 days ago. Constant flatus, bloating and multiple loose stools. Fever, fatigue started last night. Took Tylenol; afebrile today.

## 2018-04-04 NOTE — Discharge Instructions (Signed)
°  Be sure to get a lot of rest and stay well hydrated with sports drinks, water, diluted juices, and clear sodas.  Avoid fried fatty food, spicy food, and milk as these foods can cause worsening stomach upset.   Please follow up with family medicine in 2-3 days if not improving. Call 911 or go to the emergency department if symptoms significantly worsening.

## 2018-04-05 ENCOUNTER — Telehealth: Payer: Self-pay | Admitting: Emergency Medicine

## 2018-04-05 NOTE — Telephone Encounter (Signed)
Patient advised of lab results.  Patient states that she still doesn't feel well and will follow up with PCP.

## 2018-04-06 LAB — URINE CULTURE
MICRO NUMBER:: 291958
SPECIMEN QUALITY:: ADEQUATE

## 2018-04-07 ENCOUNTER — Other Ambulatory Visit: Payer: Self-pay | Admitting: Family Medicine

## 2018-04-07 DIAGNOSIS — J309 Allergic rhinitis, unspecified: Secondary | ICD-10-CM

## 2018-04-08 MED FILL — LEVOCETIRIZINE 5 MG TABLET: 5 | 30 days supply | Qty: 30 | Fill #0

## 2018-04-11 ENCOUNTER — Telehealth: Payer: Self-pay | Admitting: Emergency Medicine

## 2018-04-11 NOTE — Telephone Encounter (Signed)
Patient calling to say cough persisits; using Day-quil; no fever. Offered her visit with chest xray and she declines; offered to get cough med rx and she declines. She will try mucinex and delsym and self monitor.

## 2018-04-20 MED FILL — MONTELUKAST SOD 10 MG TAB: 10 | 30 days supply | Qty: 30 | Fill #4

## 2018-04-20 MED FILL — RALOXIFENE HCL 60 MG TABS: 60 | 30 days supply | Qty: 30 | Fill #5

## 2018-05-05 MED FILL — LEVOCETIRIZINE 5 MG TABLET: 5 | 30 days supply | Qty: 30 | Fill #1

## 2018-05-07 MED FILL — ZOLPIDEM TARTRATE 10 MG TAB: 10 | 25 days supply | Qty: 15 | Fill #0

## 2018-05-27 ENCOUNTER — Other Ambulatory Visit: Payer: Self-pay | Admitting: Family Medicine

## 2018-05-27 DIAGNOSIS — T7840XD Allergy, unspecified, subsequent encounter: Secondary | ICD-10-CM

## 2018-05-27 MED FILL — MONTELUKAST SOD 10 MG TAB: 10 | 30 days supply | Qty: 30 | Fill #0

## 2018-05-27 MED FILL — RALOXIFENE HCL 60 MG TABS: 60 | 30 days supply | Qty: 30 | Fill #6

## 2018-06-12 MED FILL — LEVOCETIRIZINE 5 MG TABLET: 5 | 30 days supply | Qty: 30 | Fill #2

## 2018-06-23 MED FILL — RALOXIFENE HCL 60 MG TABS: 60 | 30 days supply | Qty: 30 | Fill #7

## 2018-07-27 MED FILL — LEVOCETIRIZINE 5 MG TABLET: 5 | 30 days supply | Qty: 30 | Fill #3

## 2018-07-27 MED FILL — RALOXIFENE HCL 60 MG TABLET: 60 | 30 days supply | Qty: 30 | Fill #8

## 2018-07-27 MED FILL — ZOLPIDEM TARTRATE 10 MG TAB: 10 | 25 days supply | Qty: 15 | Fill #1

## 2018-08-25 MED FILL — RALOXIFENE HCL 60 MG TABLET: 60 | 30 days supply | Qty: 30 | Fill #9

## 2018-08-25 MED FILL — LEVOCETIRIZINE 5 MG TABLET: 5 | 30 days supply | Qty: 30 | Fill #4

## 2018-08-25 MED FILL — MONTELUKAST SOD 10 MG TAB: 10 | 30 days supply | Qty: 30 | Fill #1

## 2018-09-23 MED FILL — ZOLPIDEM TARTRATE 10 MG TAB: 10 | 25 days supply | Qty: 15 | Fill #2

## 2018-09-23 MED FILL — RALOXIFENE HCL 60 MG TABS: 60 | 30 days supply | Qty: 30 | Fill #10

## 2018-09-23 MED FILL — LEVOCETIRIZINE 5 MG TABLET: 5 | 30 days supply | Qty: 30 | Fill #5

## 2018-10-26 MED FILL — LEVOCETIRIZINE 5 MG TABLET: 5 | 30 days supply | Qty: 30 | Fill #6

## 2018-10-26 MED FILL — RALOXIFENE HCL 60 MG TABS: 60 | 30 days supply | Qty: 30 | Fill #11

## 2018-11-18 DIAGNOSIS — Z01419 Encounter for gynecological examination (general) (routine) without abnormal findings: Secondary | ICD-10-CM | POA: Diagnosis not present

## 2018-11-18 DIAGNOSIS — Z6822 Body mass index (BMI) 22.0-22.9, adult: Secondary | ICD-10-CM | POA: Diagnosis not present

## 2018-11-19 MED FILL — RALOXIFENE HCL 60 MG TABS: 60 | 30 days supply | Qty: 30 | Fill #0

## 2018-11-25 MED FILL — LEVOCETIRIZINE 5 MG TABLET: 5 | 30 days supply | Qty: 30 | Fill #7

## 2018-11-25 MED FILL — ZOLPIDEM TARTRATE 10 MG TAB: 10 | 25 days supply | Qty: 15 | Fill #0

## 2018-12-07 MED FILL — MONTELUKAST SOD 10 MG TAB: 10 | 30 days supply | Qty: 30 | Fill #2

## 2018-12-21 MED FILL — RALOXIFENE HCL 60 MG TABS: 60 | 30 days supply | Qty: 30 | Fill #1

## 2019-01-04 ENCOUNTER — Other Ambulatory Visit: Payer: Self-pay | Admitting: Family Medicine

## 2019-01-04 DIAGNOSIS — T7840XD Allergy, unspecified, subsequent encounter: Secondary | ICD-10-CM

## 2019-01-04 MED FILL — LEVOCETIRIZINE 5 MG TABLET: 5 | 30 days supply | Qty: 30 | Fill #8

## 2019-01-05 MED FILL — MONTELUKAST SOD 10 MG TAB: 10 | 30 days supply | Qty: 30 | Fill #0

## 2019-01-14 ENCOUNTER — Encounter: Payer: Self-pay | Admitting: Internal Medicine

## 2019-01-25 MED FILL — RALOXIFENE HCL 60 MG TABS: 60 | 30 days supply | Qty: 30 | Fill #2

## 2019-02-03 MED FILL — LEVOCETIRIZINE 5 MG TABLET: 5 | 30 days supply | Qty: 30 | Fill #9

## 2019-02-03 MED FILL — ZOLPIDEM TARTRATE 10 MG TAB: 10 | 25 days supply | Qty: 15 | Fill #1

## 2019-02-23 MED FILL — RALOXIFENE HCL 60 MG TABS: 60 | 30 days supply | Qty: 30 | Fill #3

## 2019-03-09 MED FILL — LEVOCETIRIZINE 5 MG TABLET: 5 | 30 days supply | Qty: 30 | Fill #10

## 2019-03-26 MED FILL — RALOXIFENE HCL 60 MG TABS: 60 | 30 days supply | Qty: 30 | Fill #4

## 2019-04-20 ENCOUNTER — Other Ambulatory Visit: Payer: Self-pay | Admitting: Family Medicine

## 2019-04-20 DIAGNOSIS — J309 Allergic rhinitis, unspecified: Secondary | ICD-10-CM

## 2019-04-20 MED FILL — RALOXIFENE HCL 60 MG TABS: 60 | 30 days supply | Qty: 30 | Fill #5

## 2019-04-20 MED FILL — MONTELUKAST SOD 10 MG TAB: 10 | 30 days supply | Qty: 30 | Fill #1

## 2019-04-20 MED FILL — ZOLPIDEM TARTRATE 10 MG TAB: 10 | 25 days supply | Qty: 15 | Fill #2

## 2019-05-25 MED FILL — MONTELUKAST SOD 10 MG TAB: 10 | 30 days supply | Qty: 30 | Fill #2

## 2019-05-25 MED FILL — RALOXIFENE HCL 60 MG TABS: 60 | 30 days supply | Qty: 30 | Fill #6

## 2019-05-27 ENCOUNTER — Other Ambulatory Visit: Payer: Self-pay | Admitting: Medical

## 2019-05-27 ENCOUNTER — Other Ambulatory Visit: Payer: Self-pay

## 2019-05-27 ENCOUNTER — Ambulatory Visit (HOSPITAL_BASED_OUTPATIENT_CLINIC_OR_DEPARTMENT_OTHER)
Admission: RE | Admit: 2019-05-27 | Discharge: 2019-05-27 | Disposition: A | Discharge status: Home / Self Care | Payer: BC Managed Care – PPO | Source: Ambulatory Visit | Attending: Medical | Admitting: Medical

## 2019-05-27 ENCOUNTER — Ambulatory Visit (INDEPENDENT_AMBULATORY_CARE_PROVIDER_SITE_OTHER): Payer: BC Managed Care – PPO | Admitting: Medical

## 2019-05-27 VITALS — BP 127/73 | HR 70 | Temp 97.8°F | Resp 18 | Ht 66.0 in | Wt 135.9 lb

## 2019-05-27 DIAGNOSIS — R0789 Other chest pain: Secondary | ICD-10-CM | POA: Diagnosis not present

## 2019-05-27 DIAGNOSIS — T7840XD Allergy, unspecified, subsequent encounter: Secondary | ICD-10-CM

## 2019-05-27 DIAGNOSIS — F439 Reaction to severe stress, unspecified: Secondary | ICD-10-CM | POA: Diagnosis not present

## 2019-05-27 DIAGNOSIS — J309 Allergic rhinitis, unspecified: Secondary | ICD-10-CM | POA: Diagnosis not present

## 2019-05-27 DIAGNOSIS — F419 Anxiety disorder, unspecified: Secondary | ICD-10-CM

## 2019-05-27 LAB — TROPONIN I (HIGH SENSITIVITY): High Sens Troponin I: 4 ng/L (ref 2–17)

## 2019-05-27 MED ORDER — BUSPIRONE HCL 7.5 MG PO TABS: 7.5000 mg | ORAL_TABLET | Freq: Two times a day (BID) | ORAL | 0 refills | Status: DC

## 2019-05-27 MED ORDER — NITROGLYCERIN 0.4 MG SL SUBL: 0.4000 mg | SUBLINGUAL_TABLET | SUBLINGUAL | 0 refills | Status: DC | PRN

## 2019-05-27 MED ORDER — MONTELUKAST SODIUM 10 MG PO TABS
10.0000 mg | ORAL_TABLET | Freq: Every evening | ORAL | 2 refills | Status: DC | PRN
  Filled 2020-05-23: qty 30, 30d supply, fill #0

## 2019-05-27 MED ORDER — LEVOCETIRIZINE DIHYDROCHLORIDE 5 MG PO TABS: 5.0000 mg | ORAL_TABLET | Freq: Every evening | ORAL | 11 refills | Status: DC

## 2019-05-27 MED FILL — BUSPIRONE HCL 7.5 MG TABS: 7.5 | 14 days supply | Qty: 28 | Fill #0

## 2019-05-27 MED FILL — NITROGLYCERIN 0.4 MG TAB SL: 0.4 | 14 days supply | Qty: 50 | Fill #0

## 2019-05-27 MED FILL — LEVOCETIRIZINE 5 MG TABLET: 5 | 30 days supply | Qty: 30 | Fill #0

## 2019-05-27 NOTE — Patient Instructions (Addendum)
You have current low level pressure in chest without associated cardiac type signs/symptoms. Overall low risk factors. We went over safest approach being ED evaluation for 2 set troponin with your history/presentation. You declined but need to follow closely.    Ekg compared to 2017 very similar no acute changes. Sinus rhythm seen. No acute ischemia type changes.  Will update you troponin stat. If pain worsens or changes then have to advise ED evaluation.  For anxiety rx buspar.  For allergies refilled your meds.  Will go ahead and refer you to cardiologist to further evaluate your chroinic intermittent pressure with palpitation at times.  Follow up in 2 weeks or as needed  Decided to make nitro available for patient to use as directed for angina type pain. If has to use and not resolving by 3rd dose then ED evaluation. Discussed with pt after she got chest xray.

## 2019-05-27 NOTE — Progress Notes (Signed)
Subjective:    Patient ID: Abigail [name], female    DOB: October 21, 1965, 54 y.o.   MRN: [place]:[id number]  HPI  Pt in for need to refill her allergy meds that have worked very well for her hx of allergies.   Pt states a lot of stress recently. She states not feeling depressed. 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 Interview are causing stress.   Pt has been having intermittent episodes of chest pain and palpitatiins. State almost a year of chronic. Pt has been using ambien for insomnia. For example now some vague upper chest pain medial to left shoulder. No jaw pain, no arm pain and no shoulder pain. Pt has breast implant. She states almost for one year constant vague mild pressure.  Pt states on acivity pain is not worse.   Pt states biologic aunts had heart attacks. Pt states her biologic parents had no mi or strokes. She is adopted and now this. Pt has never smoked. Pt lipid panel a year ago was good. Pt has no diabetes. Not morbidly obese.  Some ha that often occurs as she is approaching school deadlines.    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   Pt does not want to go to ED though I mentioned this is most conservative/safest advisement.  Review of Systems  Constitutional: Negative for chills, fatigue and fever.  Respiratory: Negative for cough, chest tightness, wheezing and stridor.   Cardiovascular:       States mild pressure today. See hpi. No palpitation.  Gastrointestinal: Negative for abdominal distention, anal bleeding, rectal pain and vomiting.  Musculoskeletal: Negative for gait problem and neck pain.    Neurological: Negative for dizziness, facial asymmetry and light-headedness.  Hematological: Negative for adenopathy. Does not bruise/bleed easily.  Psychiatric/Behavioral: Negative for behavioral problems.       Objective:   Physical Exam  General Mental Status- Alert. General Appearance- Not in acute distress.   Skin General: Color- Normal Color. Moisture- Normal Moisture.  Neck Carotid Arteries- Normal color. Moisture- Normal Moisture. No carotid bruits. No JVD.  Chest and Lung Exam Auscultation: Breath Sounds:-Normal.  Cardiovascular Auscultation:Rythm- Regular. Murmurs & Other Heart Sounds:Auscultation of the heart reveals- No Murmurs.  Abdomen Inspection:-Inspeection Normal. Palpation/Percussion:Note:No mass. Palpation and Percussion of the abdomen reveal- Non Tender, Non Distended + BS, no rebound or guarding.   Neurologic Cranial Nerve exam:- CN III-XII intact(No nystagmus), symmetric smile. Strength:- 5/5 equal and symmetric strength both upper and lower extremities.      Assessment & Plan:  You have current low level pressure in chest without associated cardiac type signs/symptoms. Overall low risk factors. We went over safest approach being ED evaluation for 2 set troponin with your history/presentation. You declined but need to follow closely.   Ekg compared to 2017 very similar similar no acute changes. Sinus rhythm seen. No acute ischemia type changes.  Will update you troponin stat. If pain worsens or  changes then have to advise ED evaluation.  For anxiety rx buspar.  For allergies refilled your meds.  Will go ahead and refer you to cardiologist to further evaluate your chroinic intermittent pressure with palpitation at times.  Follow up in 2 weeks or as needed  Time spent with patient today was  45 minutes which consisted of chart review, discussing  diagnosis, work up, referral,  treatment and documentation. Also talked with Dr. Charlett Blake pt pcp.

## 2019-06-09 ENCOUNTER — Encounter: Payer: Self-pay | Admitting: Cardiology

## 2019-06-09 ENCOUNTER — Telehealth: Payer: Self-pay | Admitting: Medical

## 2019-06-09 ENCOUNTER — Other Ambulatory Visit: Payer: Self-pay | Admitting: Medical

## 2019-06-09 ENCOUNTER — Other Ambulatory Visit: Payer: Self-pay

## 2019-06-09 ENCOUNTER — Ambulatory Visit (INDEPENDENT_AMBULATORY_CARE_PROVIDER_SITE_OTHER): Payer: BC Managed Care – PPO | Admitting: Cardiology

## 2019-06-09 DIAGNOSIS — R002 Palpitations: Secondary | ICD-10-CM | POA: Diagnosis not present

## 2019-06-09 DIAGNOSIS — R9431 Abnormal electrocardiogram [ECG] [EKG]: Secondary | ICD-10-CM | POA: Insufficient documentation

## 2019-06-09 DIAGNOSIS — R0789 Other chest pain: Secondary | ICD-10-CM

## 2019-06-09 MED ORDER — BUSPIRONE HCL 7.5 MG PO TABS: 7.5000 mg | ORAL_TABLET | Freq: Two times a day (BID) | ORAL | 3 refills | Status: DC

## 2019-06-09 NOTE — Patient Instructions (Signed)
Medication Instructions:  °Your physician recommends that you continue on your current medications as directed. Please refer to the Current Medication list given to you today. ° °*If you need a refill on your cardiac medications before your next appointment, please call your pharmacy* ° ° °Lab Work: °None. ° °If you have labs (blood work) drawn today and your tests are completely normal, you will receive your results only by: °• MyChart Message (if you have MyChart) OR °• A paper copy in the mail °If you have any lab test that is abnormal or we need to change your treatment, we will call you to review the results. ° ° °Testing/Procedures: °Your physician has requested that you have an echocardiogram. Echocardiography is a painless test that uses sound waves to create images of your heart. It provides your doctor with information about the size and shape of your heart and how well your heart’s chambers and valves are working. This procedure takes approximately one hour. There are no restrictions for this procedure. ° °A zio monitor was ordered today. It will remain on for 7 days. You will then return monitor and event diary in provided box. It takes 1-2 weeks for report to be downloaded and returned to us. We will call you with the results. If monitor falls off or has orange flashing light, please call Zio for further instructions.  ° ° ° ° °Follow-Up: °At [facility], you and your health needs are our priority.  As part of our continuing mission to provide you with exceptional heart care, we have created designated Provider Care Teams.  These Care Teams include your primary Cardiologist (physician) and Advanced Practice Providers (APPs -  Physician Assistants and Nurse Practitioners) who all work together to provide you with the care you need, when you need it. ° °We recommend signing up for the patient portal called "MyChart".  Sign up information is provided on this After Visit Summary.  MyChart is used to  connect with patients for Virtual Visits (Telemedicine).  Patients are able to view lab/test results, encounter notes, upcoming appointments, etc.  Non-urgent messages can be sent to your provider as well.   °To learn more about what you can do with MyChart, go to [email or web address].   ° °Your next appointment:   °1 month(s) ° °The format for your next appointment:   °In Person ° °Provider:   °Robert Krasowski, MD ° ° °Other Instructions ° ° °Echocardiogram °An echocardiogram is a procedure that uses painless sound waves (ultrasound) to produce an image of the heart. Images from an echocardiogram can provide important information about: °· Signs of coronary artery disease (CAD). °· Aneurysm detection. An aneurysm is a weak or damaged part of an artery wall that bulges out from the normal force of blood pumping through the body. °· Heart size and shape. Changes in the size or shape of the heart can be associated with certain conditions, including heart failure, aneurysm, and CAD. °· Heart muscle function. °· Heart valve function. °· Signs of a past heart attack. °· Fluid buildup around the heart. °· Thickening of the heart muscle. °· A tumor or infectious growth around the heart valves. °Tell a health care provider about: °· Any allergies you have. °· All medicines you are taking, including vitamins, herbs, eye drops, creams, and over-the-counter medicines. °· Any blood disorders you have. °· Any surgeries you have had. °· Any medical conditions you have. °· Whether you are pregnant or may be pregnant. °What are the risks? °  Generally, this is a safe procedure. However, problems may occur, including: °· Allergic reaction to dye (contrast) that may be used during the procedure. °What happens before the procedure? °No specific preparation is needed. You may eat and drink normally. °What happens during the procedure? ° °· An IV tube may be inserted into one of your veins. °· You may receive contrast through this  tube. A contrast is an injection that improves the quality of the pictures from your heart. °· A gel will be applied to your chest. °· A wand-like tool (transducer) will be moved over your chest. The gel will help to transmit the sound waves from the transducer. °· The sound waves will harmlessly bounce off of your heart to allow the heart images to be captured in real-time motion. The images will be recorded on a computer. °The procedure may vary among health care providers and hospitals. °What happens after the procedure? °· You may return to your normal, everyday life, including diet, activities, and medicines, unless your health care provider tells you not to do that. °Summary °· An echocardiogram is a procedure that uses painless sound waves (ultrasound) to produce an image of the heart. °· Images from an echocardiogram can provide important information about the size and shape of your heart, heart muscle function, heart valve function, and fluid buildup around your heart. °· You do not need to do anything to prepare before this procedure. You may eat and drink normally. °· After the echocardiogram is completed, you may return to your normal, everyday life, unless your health care provider tells you not to do that. °This information is not intended to replace advice given to you by your health care provider. Make sure you discuss any questions you have with your health care provider. °Document Revised: 05/07/2018 Document Reviewed: 02/17/2016 °Elsevier Patient Education © 2020 [employer] ° ° °

## 2019-06-09 NOTE — Progress Notes (Signed)
Cardiology Consultation:    Date:  06/09/2019   ID:  [name], DOB [birth year], MRN [id number]  PCP:  [name], MD  Cardiologist:  Jenne Campus, MD   Referring MD: Elise Benne   Chief Complaint  Patient presents with  . New Patient (Initial Visit)  Of palpitation chest pain  History of Present Illness:    Abigail [name] is a 54 y.o. female who is being seen today for the evaluation of palpitations chest pain at the request of [place], Percell Miller, [place].  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  She went to her primary care physician for refill on her medication and she mentioned something about chest pain.  She described the sensation as heavy sensation that is there all the time she wakes after this she goes to sleep with that.  There is no provoking no relieving factors there is no shortness of breath no sweating associated with this sensation.  At the same time she can walk and climb stairs have no significant symptoms associated with it.  She said she feel the sensation worse when she sits in front of the computer have to do her homework.  Another complaint that she described is palpitations.  She feels her heart skipping and flip-flopping sometimes more sustained arrhythmia lasting for few seconds.  No shortness of breath no sweating associated with it it happened typically at evening time she is trying to relax.  Lately they have become more frequent. She described to be under a lot of stress.  She is studying for her PhD and is very stressful to her. She was given BuSpar for anxiety and it seems to be helping with her symptomatology as well. She never smoked, get always perfect cholesterol Does have some premature coronary disease in the family.  Past Medical History:  Diagnosis Date  . Allergic state 12/04/2010  . Allergy    seasonal- spring  . Anemia    mild  . Anxiety 02/11/2011  . BRCA gene mutation positive 11/21/2015  . Breast cancer ([place])  06/05/11   RIGHT  BX,POSTERIOR ,SLIGHT LATERAL=FOCAL ATYPIA LOBULAR HYPERPLASIA IN BACKGROUND OF EXTENSIVE STROMAL FIBROSIS  . 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 Chicken pox as a child  . History of mononucleosis   . History of streptococcal pharyngitis   . Hot flashes   . Insomnia 02/11/2011  . Post-operative nausea and vomiting    past c-section  . Preventative health care 12/04/2010  . S/P chemotherapy, time since less than 4 weeks 05/23/2011   completed 4 doses docetaxel/cyclophosphamide  . Urticaria   . UTI (lower urinary tract infection) 11/28/2011  . Vaginitis 10/08/2011    Past Surgical History:  Procedure Laterality Date  . BREAST RECONSTRUCTION Bilateral 03/24/2012   Procedure: BREAST RECONSTRUCTION WITH PLACEMENT OF SILICONE GEL IMPLANTS;  Surgeon: Crissie Reese, MD;  Location: [facility];  Service: Plastics;  Laterality: Bilateral;  . [place]   X 1  . MASTECTOMY W/ SENTINEL NODE BIOPSY  08/07/2011   Procedure: MASTECTOMY WITH SENTINEL LYMPH NODE BIOPSY;  Surgeon: Merrie Roof, MD;  Location: [facility];  Service: General;  Laterality: Bilateral;  bilateral mastectomy  . MOUTH SURGERY  1997   gum surgery  . PORT-A-CATH REMOVAL  08/07/2011   Procedure: REMOVAL PORT-A-CATH;  Surgeon: Merrie Roof, MD;  Location: [facility];  Service: General;  Laterality: Right;  .  PORTACATH PLACEMENT  03/20/2011   Procedure: INSERTION PORT-A-CATH;  Surgeon: Merrie Roof, MD;  Location: [facility];  Service: General;  Laterality: Right;  placement of port -Right Subclavian Vein  . TISSUE EXPANDER PLACEMENT  08/07/2011   Procedure: TISSUE EXPANDER;  Surgeon: Crissie Reese, MD;  Location: [facility];  Service: Plastics;  Laterality: Bilateral;  BILATERAL PLACEMENT OF TISSUE EXPANDERS FOR BREAST RECONSTRUCTION  . TUBAL LIGATION     1999  . [facility]    Current Medications: Current Meds    Medication Sig  . busPIRone (BUSPAR) 7.5 MG tablet Take 1 tablet (7.5 mg total) by mouth 2 (two) times daily.  . Calcium Carb-Cholecalciferol (CALCIUM + VITAMIN D3 PO) Take by mouth.  . levocetirizine (XYZAL) 5 MG tablet Take 1 tablet (5 mg total) by mouth every evening.  . loratadine (CLARITIN) 10 MG tablet Take 10 mg by mouth 2 (two) times daily.   . montelukast (SINGULAIR) 10 MG tablet Take 1 tablet (10 mg total) by mouth at bedtime as needed.  . nitroGLYCERIN (NITROSTAT) 0.4 MG SL tablet Place 1 tablet (0.4 mg total) under the tongue every 5 (five) minutes as needed for chest pain.  . raloxifene (EVISTA) 60 MG tablet   . zolpidem (AMBIEN) 10 MG tablet Take 0.5 tablets (5 mg total) by mouth at bedtime as needed for sleep. For sleep, pt takes 1/3 of tablet     Allergies:   Penicillins   Social History   Socioeconomic History  . Marital status: Married    Spouse name: Not on file  . Number of children: 2  . Years of education: Not on file  . Highest education level: Not on file  Occupational History  . Occupation: [employer]    Employer: [employer]  Tobacco Use  . Smoking status: Never Smoker  . Smokeless tobacco: Never Used  Substance and Sexual Activity  . Alcohol use: Yes    Comment: occasionally- social  2 drinks  . Drug use: No  . Sexual activity: Yes    Birth control/protection: Surgical  Other Topics Concern  . Not on file  Social History Narrative  . Not on file   Social Determinants of Health   Financial Resource Strain:   . Difficulty of Paying Living Expenses:   Food Insecurity:   . Worried About Charity fundraiser in the Last Year:   . Arboriculturist in the Last Year:   Transportation Needs:   . Film/video editor (Medical):   Marland Kitchen Lack of Transportation (Non-Medical):   Physical Activity:   . Days of Exercise per Week:   . Minutes of Exercise per Session:   Stress:   . Feeling of Stress :   Social Connections:   . Frequency of  Communication with Friends and Family:   . Frequency of Social Gatherings with Friends and Family:   . Attends Religious Services:   . Active Member of Clubs or Organizations:   . Attends Archivist Meetings:   Marland Kitchen Marital Status:      Family History: The patient's family history includes Cancer in an other family member; Heart disease in her maternal uncle and mother; Other in her maternal grandmother. She was adopted. ROS:   Please see the history of present illness.    All 14 point review of systems negative except as described per history of present illness.  EKGs/Labs/Other Studies Reviewed:  The following studies were reviewed today: Normal sinus rhythm, normal P interval, cannot rule out anterior septal wall M    Recent Labs: No results found for requested labs within last 8760 hours.  Recent Lipid Panel    Component Value Date/Time   CHOL 155 12/01/2017 1224   TRIG 53.0 12/01/2017 1224   HDL 68.80 12/01/2017 1224   CHOLHDL 2 12/01/2017 1224   VLDL 10.6 12/01/2017 1224   LDLCALC 75 12/01/2017 1224    Physical Exam:    VS:  BP 100/76   Pulse 74   Ht _0  (1.676 m)   Wt 140 lb (63.5 kg)   LMP 03/02/2011   SpO2 98%   BMI 22.60 kg/m     Wt Readings from Last 3 Encounters:  06/09/19 140 lb (63.5 kg)  05/27/19 135 lb 14.4 oz (61.6 kg)  04/04/18 139 lb 1.9 oz (63.1 kg)     GEN:  Well nourished, well developed in no acute distress HEENT: Normal NECK: No JVD; No carotid bruits LYMPHATICS: No lymphadenopathy CARDIAC: RRR, no murmurs, no rubs, no gallops RESPIRATORY:  Clear to auscultation without rales, wheezing or rhonchi  ABDOMEN: Soft, non-tender, non-distended MUSCULOSKELETAL:  No edema; No deformity  SKIN: Warm and dry NEUROLOGIC:  Alert and oriented x 3 PSYCHIATRIC:  Normal affect   ASSESSMENT:    1. Palpitations   2. Atypical chest pain   3. Nonspecific abnormal electrocardiogram (ECG) (EKG)    PLAN:    In order of problems listed  above:  1. Palpitations.  I will ask you to wear Zio patch for week.  As a part of evaluation echocardiogram will be done to assess left ventricle ejection fraction.  I will not initiate any therapy until we know exactly what we are dealing with. 2. Atypical chest pain with multiple characteristic indicating not relation to her heart.  It is there all the time she graded this sensation 1-2 in scale up to 10, BuSpar seems to be helping.  There is no aggravating no relieving factors.  We had a long discussion about what to do with the situation I told her that stress test can be considered but with her low likelihood of coronary artery disease she elected not to pursue that avenue.  I talked to her about potentially doing a calcium score as a screening she still does not want to do it.  She does not think it is related to her heart and honestly the story that she told me today is not convincing.  The plan will be to do echocardiogram and repeat her EKG if EKG is abnormal or echocardiogram will be abnormal then will do stress testing. 3. Nonspecific abnormal electrocardiogram.  Will be repeated today and then echocardiogram will be done.  I did review her troponin I which was normal.     Medication Adjustments/Labs and Tests Ordered: Current medicines are reviewed at length with the patient today.  Concerns regarding medicines are outlined above.  No orders of the defined types were placed in this encounter.  No orders of the defined types were placed in this encounter.   Signed, Park Liter, MD, [facility]. 06/09/2019 10:44 AM    [facility]

## 2019-06-09 NOTE — Telephone Encounter (Signed)
Rx buspar sent to pt pharmacy. 

## 2019-06-16 ENCOUNTER — Ambulatory Visit (HOSPITAL_BASED_OUTPATIENT_CLINIC_OR_DEPARTMENT_OTHER)
Admission: RE | Admit: 2019-06-16 | Discharge: 2019-06-16 | Disposition: A | Discharge status: Home / Self Care | Payer: BC Managed Care – PPO | Source: Ambulatory Visit | Attending: Cardiology | Admitting: Cardiology

## 2019-06-16 ENCOUNTER — Other Ambulatory Visit: Payer: Self-pay

## 2019-06-16 ENCOUNTER — Ambulatory Visit (INDEPENDENT_AMBULATORY_CARE_PROVIDER_SITE_OTHER): Payer: BC Managed Care – PPO

## 2019-06-16 DIAGNOSIS — R0789 Other chest pain: Secondary | ICD-10-CM

## 2019-06-16 DIAGNOSIS — R002 Palpitations: Secondary | ICD-10-CM

## 2019-06-16 DIAGNOSIS — R9431 Abnormal electrocardiogram [ECG] [EKG]: Secondary | ICD-10-CM | POA: Diagnosis not present

## 2019-06-23 MED FILL — LEVOCETIRIZINE 5 MG TABLET: 5 | 30 days supply | Qty: 30 | Fill #1

## 2019-06-23 MED FILL — RALOXIFENE HCL 60 MG TABS: 60 | 30 days supply | Qty: 30 | Fill #7

## 2019-07-16 ENCOUNTER — Ambulatory Visit: Payer: BC Managed Care – PPO | Admitting: Cardiology

## 2019-07-26 MED FILL — RALOXIFENE HCL 60 MG TABS: 60 | 30 days supply | Qty: 30 | Fill #8

## 2019-07-26 MED FILL — LEVOCETIRIZINE 5 MG TABLET: 5 | 30 days supply | Qty: 30 | Fill #2

## 2019-08-10 ENCOUNTER — Encounter: Payer: Self-pay | Admitting: Emergency Medicine

## 2019-08-10 ENCOUNTER — Telehealth: Payer: Self-pay | Admitting: Emergency Medicine

## 2019-08-10 MED FILL — BUSPIRONE HCL 7.5 MG TABS: 7.5 | 30 days supply | Qty: 60 | Fill #2

## 2019-08-10 NOTE — Telephone Encounter (Signed)
Patient called and was informed of monitor results. No further questions.

## 2019-08-23 MED FILL — RALOXIFENE HCL 60 MG TABS: 60 | 30 days supply | Qty: 30 | Fill #9

## 2019-08-23 MED FILL — ZOLPIDEM TARTRATE 10 MG TAB: 10 | 15 days supply | Qty: 15 | Fill #1

## 2019-08-23 MED FILL — LEVOCETIRIZINE 5 MG TABLET: 5 | 30 days supply | Qty: 30 | Fill #3

## 2019-08-30 ENCOUNTER — Ambulatory Visit: Payer: BC Managed Care – PPO | Admitting: Cardiology

## 2019-09-06 MED FILL — BUSPIRONE HCL 7.5 MG TABS: 7.5 | 30 days supply | Qty: 60 | Fill #3

## 2019-09-06 MED FILL — MONTELUKAST SOD 10 MG TAB: 10 | 30 days supply | Qty: 30 | Fill #0

## 2019-09-10 ENCOUNTER — Encounter: Payer: Self-pay | Admitting: Genetic Counselor

## 2019-09-14 ENCOUNTER — Telehealth: Payer: Self-pay | Admitting: Family Medicine

## 2019-09-14 DIAGNOSIS — Z111 Encounter for screening for respiratory tuberculosis: Secondary | ICD-10-CM

## 2019-09-14 NOTE — Telephone Encounter (Signed)
Is it okay to place order for TB Gold ?

## 2019-09-14 NOTE — Telephone Encounter (Signed)
CallerCorrissa [name] Call back # [phone]  Patient needs a tb test for employment is wondering if you could place orders.

## 2019-09-14 NOTE — Telephone Encounter (Signed)
Yes please order TB gold and set up lab appt.

## 2019-09-15 ENCOUNTER — Other Ambulatory Visit: Payer: BC Managed Care – PPO

## 2019-09-15 DIAGNOSIS — Z111 Encounter for screening for respiratory tuberculosis: Secondary | ICD-10-CM

## 2019-09-15 NOTE — Telephone Encounter (Signed)
Orders placed pt stated she will go to [facility]

## 2019-09-15 NOTE — Telephone Encounter (Signed)
Called pt and lvm to return call.  

## 2019-09-18 LAB — QUANTIFERON-TB GOLD PLUS
Mitogen-NIL: >10 IU/mL
NIL: 0.01 IU/mL
QuantiFERON-TB Gold Plus: NEGATIVE
TB1-NIL: 0 IU/mL
TB2-NIL: 0.02 IU/mL

## 2019-09-22 MED FILL — LEVOCETIRIZINE 5 MG TABLET: 5 | 30 days supply | Qty: 30 | Fill #4

## 2019-09-22 MED FILL — RALOXIFENE HCL 60 MG TABS: 60 | 30 days supply | Qty: 30 | Fill #10

## 2019-10-07 ENCOUNTER — Other Ambulatory Visit: Payer: Self-pay | Admitting: Medical

## 2019-10-07 MED FILL — BUSPIRONE HCL 7.5 MG TABS: 7.5 | 30 days supply | Qty: 60 | Fill #0

## 2019-10-11 MED FILL — ZOLPIDEM TARTRATE 10 MG TAB: 10 | 15 days supply | Qty: 15 | Fill #2

## 2019-10-25 MED FILL — RALOXIFENE HCL 60 MG TABS: 60 | 30 days supply | Qty: 30 | Fill #11

## 2019-10-27 MED FILL — LEVOCETIRIZINE 5 MG TABLET: 5 | 30 days supply | Qty: 30 | Fill #5

## 2019-11-23 ENCOUNTER — Other Ambulatory Visit (HOSPITAL_BASED_OUTPATIENT_CLINIC_OR_DEPARTMENT_OTHER): Payer: Self-pay | Admitting: Obstetrics and Gynecology

## 2019-11-23 MED FILL — RALOXIFENE HCL 60 MG TABS: 60 | 30 days supply | Qty: 30 | Fill #0

## 2019-11-25 ENCOUNTER — Other Ambulatory Visit: Payer: Self-pay

## 2019-11-25 ENCOUNTER — Encounter: Payer: Self-pay | Admitting: Family Medicine

## 2019-11-25 ENCOUNTER — Other Ambulatory Visit: Payer: Self-pay | Admitting: Family Medicine

## 2019-11-25 ENCOUNTER — Ambulatory Visit: Payer: BC Managed Care – PPO | Admitting: Family Medicine

## 2019-11-25 VITALS — BP 108/78 | HR 65 | Temp 98.0°F | Resp 18 | Ht 66.0 in | Wt 140.2 lb

## 2019-11-25 DIAGNOSIS — Z566 Other physical and mental strain related to work: Secondary | ICD-10-CM | POA: Diagnosis not present

## 2019-11-25 DIAGNOSIS — Z23 Encounter for immunization: Secondary | ICD-10-CM

## 2019-11-25 DIAGNOSIS — F419 Anxiety disorder, unspecified: Secondary | ICD-10-CM

## 2019-11-25 MED ORDER — ESCITALOPRAM OXALATE 10 MG PO TABS: 10.0000 mg | ORAL_TABLET | Freq: Every day | ORAL | 2 refills | Status: DC

## 2019-11-25 MED FILL — ESCITALOPRAM 10 MG TABLET: 10 | 30 days supply | Qty: 30 | Fill #0

## 2019-11-25 NOTE — Progress Notes (Signed)
Patient ID: [name], female    DOB: 10-Apr-1965  Age: 54 y.o. MRN: [id number]    Subjective:  Subjective  HPI Abigail [name] presents for f/u anxiety / stress at work.  She thinks the buspar caused a rash on her hands----- she stopped it and rash went away but now she needs something else for anxiety/ stress  Review of Systems  Constitutional: Negative for appetite change, diaphoresis, fatigue and unexpected weight change.  Eyes: Negative for pain, redness and visual disturbance.  Respiratory: Negative for cough, chest tightness, shortness of breath and wheezing.   Cardiovascular: Negative for chest pain, palpitations and leg swelling.  Endocrine: Negative for cold intolerance, heat intolerance, polydipsia, polyphagia and polyuria.  Genitourinary: Negative for difficulty urinating, dysuria and frequency.  Neurological: Negative for dizziness, light-headedness, numbness and headaches.  Psychiatric/Behavioral: Positive for decreased concentration. Negative for dysphoric mood, self-injury, sleep disturbance and suicidal ideas. The patient is nervous/anxious.     History Past Medical History:  Diagnosis Date  . Allergic state 12/04/2010  . Allergy    seasonal- spring  . Anemia    mild  . Anxiety 02/11/2011  . BRCA gene mutation positive 11/21/2015  . Breast cancer ([place]) 06/05/11   RIGHT  BX,POSTERIOR ,SLIGHT LATERAL=FOCAL ATYPIA LOBULAR HYPERPLASIA IN BACKGROUND OF EXTENSIVE STROMAL FIBROSIS  . 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 Chicken pox as a child  . History of mononucleosis   . History of streptococcal pharyngitis   . Hot flashes   . Insomnia 02/11/2011  . Post-operative nausea and vomiting    past c-section  . Preventative health care 12/04/2010  . S/P chemotherapy, time since less than 4 weeks 05/23/2011   completed 4 doses docetaxel/cyclophosphamide  . Urticaria   . UTI (lower urinary tract infection) 11/28/2011  .  Vaginitis 10/08/2011    She has a past surgical history that includes Mouth surgery (1997); Cesarean section (1999); Varicose vein surgery; Portacath placement (03/20/2011); Tubal ligation; Port-a-cath removal (08/07/2011); Mastectomy w/ sentinel node biopsy (08/07/2011); Tissue expander placement (08/07/2011); and Breast reconstruction (Bilateral, 03/24/2012).   Her family history includes Cancer in an other family member; Heart disease in her maternal uncle and mother; Other in her maternal grandmother. She was adopted.She reports that she has never smoked. She has never used smokeless tobacco. She reports current alcohol use. She reports that she does not use drugs.  Current Outpatient Medications on File Prior to Visit  Medication Sig Dispense Refill  . Calcium Carb-Cholecalciferol (CALCIUM + VITAMIN D3 PO) Take by mouth.    . levocetirizine (XYZAL) 5 MG tablet Take 1 tablet (5 mg total) by mouth every evening. 30 tablet 11  . loratadine (CLARITIN) 10 MG tablet Take 10 mg by mouth 2 (two) times daily.     . montelukast (SINGULAIR) 10 MG tablet Take 1 tablet (10 mg total) by mouth at bedtime as needed. 30 tablet 2  . nitroGLYCERIN (NITROSTAT) 0.4 MG SL tablet Place 1 tablet (0.4 mg total) under the tongue every 5 (five) minutes as needed for chest pain. 50 tablet 0  . raloxifene (EVISTA) 60 MG tablet   12  . zolpidem (AMBIEN) 10 MG tablet Take 0.5 tablets (5 mg total) by mouth at bedtime as needed for sleep. For sleep, pt takes 1/3 of tablet 30 tablet 0   No current facility-administered medications on file prior to visit.     Objective:  Objective  Physical Exam Vitals and nursing note reviewed.  Constitutional:      Appearance: She is well-developed.  HENT:     Head: Normocephalic and atraumatic.  Eyes:     Conjunctiva/sclera: Conjunctivae normal.  Neck:     Thyroid: No thyromegaly.     Vascular: No carotid bruit or JVD.  Cardiovascular:     Rate and Rhythm: Normal rate and regular  rhythm.     Heart sounds: Normal heart sounds. No murmur heard.   Pulmonary:     Effort: Pulmonary effort is normal. No respiratory distress.     Breath sounds: Normal breath sounds. No wheezing or rales.  Chest:     Chest wall: No tenderness.  Musculoskeletal:     Cervical back: Normal range of motion and neck supple.  Neurological:     Mental Status: She is alert and oriented to person, place, and time.  Psychiatric:        Attention and Perception: Attention and perception normal.        Mood and Affect: Mood is anxious. Affect is not tearful or inappropriate.        Speech: Speech normal.        Behavior: Behavior normal.        Cognition and Memory: Cognition normal.    BP 108/78 (BP Location: Right Arm, Patient Position: Sitting, Cuff Size: Normal)   Pulse 65   Temp 98 F (36.7 C) (Oral)   Resp 18   Ht 5' 6"  (1.676 m)   Wt 140 lb 3.2 oz (63.6 kg)   LMP 03/02/2011   SpO2 100%   BMI 22.63 kg/m  Wt Readings from Last 3 Encounters:  11/25/19 140 lb 3.2 oz (63.6 kg)  06/09/19 140 lb (63.5 kg)  05/27/19 135 lb 14.4 oz (61.6 kg)     Lab Results  Component Value Date   WBC 4.8 12/01/2017   HGB 13.3 12/01/2017   HCT 40.1 12/01/2017   PLT 254.0 12/01/2017   GLUCOSE 99 04/04/2018   CHOL 155 12/01/2017   TRIG 53.0 12/01/2017   HDL 68.80 12/01/2017   LDLCALC 75 12/01/2017   ALT 34 (H) 04/04/2018   AST 39 (H) 04/04/2018   NA 141 04/04/2018   K 4.1 04/04/2018   CL 104 04/04/2018   CREATININE 0.76 04/04/2018   BUN 20 04/04/2018   CO2 30 04/04/2018   TSH 1.26 12/01/2017   HGBA1C 5.7 12/01/2017    ECHOCARDIOGRAM COMPLETE  Result Date: 06/16/2019    ECHOCARDIOGRAM REPORT   Patient Name:   Abigail [name] Date of Exam: 06/16/2019 Medical Rec #:  [id number]        Height:       66.0 in Accession #:    [id number]       Weight:       140.0 lb Date of Birth:  1965-12-31       BSA:          1.719 m Patient Age:    58 years         BP:           100/76 mmHg Patient Gender:  F                HR:           67 bpm. Exam Location:  [place] [facility] Procedure: 2D Echo, Cardiac Doppler and Color Doppler Indications:    R07.9* Chest pain, unspecified,; R00.2 Palpitations  History:        Patient has no prior history of Echocardiogram  examinations.                 Signs/Symptoms:Chest Pain and palpitations; Risk                 Factors:Non-Smoker. Patient has been under lots of stress and                 has noticed left chest tightness w/ occasional palpitations.  Sonographer:    Salvadore Dom RVT, RDCS (AE), RDMS Referring Phys: [phone] Park Liter  Sonographer Comments: Suboptimal parasternal window. Bilateral silicone implants 7026. Limited SAX views due to implants. IMPRESSIONS  1. Left ventricular ejection fraction, by estimation, is 60 to 65%. The left ventricle has normal function. The left ventricle has no regional wall motion abnormalities. Left ventricular diastolic parameters were normal.  2. Right ventricular systolic function is normal. The right ventricular size is normal. There is normal pulmonary artery systolic pressure.  3. The mitral valve is normal in structure. No evidence of mitral valve regurgitation. No evidence of mitral stenosis.  4. The aortic valve is normal in structure. Aortic valve regurgitation is not visualized. No aortic stenosis is present.  5. The inferior vena cava is normal in size with greater than 50% respiratory variability, suggesting right atrial pressure of 3 mmHg.  6. Evidence of atrial level shunting detected by color flow Doppler. There is a small patent foramen ovale with predominantly left to right shunting across the atrial septum. FINDINGS  Left Ventricle: Left ventricular ejection fraction, by estimation, is 60 to 65%. The left ventricle has normal function. The left ventricle has no regional wall motion abnormalities. The left ventricular internal cavity size was normal in size. There is  no left ventricular hypertrophy. Left ventricular  diastolic parameters were normal. Right Ventricle: The right ventricular size is normal. No increase in right ventricular wall thickness. Right ventricular systolic function is normal. There is normal pulmonary artery systolic pressure. The tricuspid regurgitant velocity is 2.39 m/s, and  with an assumed right atrial pressure of 10 mmHg, the estimated right ventricular systolic pressure is 37.8 mmHg. Left Atrium: Left atrial size was normal in size. Right Atrium: Right atrial size was normal in size. Pericardium: There is no evidence of pericardial effusion. Mitral Valve: The mitral valve is normal in structure. Normal mobility of the mitral valve leaflets. No evidence of mitral valve regurgitation. No evidence of mitral valve stenosis. Tricuspid Valve: The tricuspid valve is normal in structure. Tricuspid valve regurgitation is mild . No evidence of tricuspid stenosis. Aortic Valve: The aortic valve is normal in structure. Aortic valve regurgitation is not visualized. No aortic stenosis is present. Aortic valve mean gradient measures 3.0 mmHg. Aortic valve peak gradient measures 5.1 mmHg. Aortic valve area, by VTI measures 2.31 cm. Pulmonic Valve: The pulmonic valve was normal in structure. Pulmonic valve regurgitation is not visualized. No evidence of pulmonic stenosis. Aorta: The aortic root is normal in size and structure. Venous: The inferior vena cava is normal in size with greater than 50% respiratory variability, suggesting right atrial pressure of 3 mmHg. IAS/Shunts: Evidence of atrial level shunting detected by color flow Doppler. A small patent foramen ovale is detected with predominantly left to right shunting across the atrial septum.  LEFT VENTRICLE PLAX 2D LVIDd:         4.01 cm     Diastology LVIDs:         2.66 cm     LV e' lateral:   11.90 cm/s LV PW:  0.94 cm     LV E/e' lateral: 6.1 LV IVS:        0.58 cm     LV e' medial:    7.40 cm/s LVOT diam:     1.80 cm     LV E/e' medial:  9.7 LV  SV:         60 LV SV Index:   35 LVOT Area:     2.54 cm  LV Volumes (MOD) LV vol d, MOD A2C: 66.5 ml LV vol d, MOD A4C: 58.6 ml LV vol s, MOD A2C: 22.0 ml LV vol s, MOD A4C: 20.0 ml LV SV MOD A2C:     44.5 ml LV SV MOD A4C:     58.6 ml LV SV MOD BP:      47.1 ml RIGHT VENTRICLE RV S prime:     13.70 cm/s LEFT ATRIUM           Index       RIGHT ATRIUM           Index LA diam:      2.70 cm 1.57 cm/m  RA Area:     17.30 cm LA Vol (A4C): 40.1 ml 23.33 ml/m RA Volume:   46.80 ml  27.23 ml/m  AORTIC VALVE                   PULMONIC VALVE AV Area (Vmax):    2.39 cm    PV Vmax:       0.46 m/s AV Area (Vmean):   2.30 cm    PV Peak grad:  0.9 mmHg AV Area (VTI):     2.31 cm AV Vmax:           113.00 cm/s AV Vmean:          77.400 cm/s AV VTI:            0.258 m AV Peak Grad:      5.1 mmHg AV Mean Grad:      3.0 mmHg LVOT Vmax:         106.00 cm/s LVOT Vmean:        69.900 cm/s LVOT VTI:          0.234 m LVOT/AV VTI ratio: 0.91  AORTA Ao Root diam: 2.90 cm MITRAL VALVE               TRICUSPID VALVE MV Area (PHT): 4.06 cm    TR Peak grad:   22.8 mmHg MV Decel Time: 187 msec    TR Vmax:        239.00 cm/s MV E velocity: 72.00 cm/s MV A velocity: 68.10 cm/s  SHUNTS MV E/A ratio:  1.06        Systemic VTI:  0.23 m                            Systemic Diam: 1.80 cm Jenne Campus MD Electronically signed by Jenne Campus MD Signature Date/Time: 06/16/2019/12:40:15 PM    Final      Assessment & Plan:  Plan  I have discontinued Malachy Mood A. [place]'s busPIRone. I am also having her start on escitalopram. Additionally, I am having her maintain her zolpidem, loratadine, raloxifene, Calcium Carb-Cholecalciferol (CALCIUM + VITAMIN D3 PO), levocetirizine, montelukast, and nitroGLYCERIN.  Meds ordered this encounter  Medications  . escitalopram (LEXAPRO) 10 MG tablet    Sig: Take 1 tablet (10 mg total) by mouth at bedtime.  Dispense:  30 tablet    Refill:  2    Problem List Items Addressed This Visit       Unprioritized   Stress at work - Primary    Anxiety---- d/c buspar Start lexapro  F/u with pcp in 1 month       Relevant Medications   escitalopram (LEXAPRO) 10 MG tablet    Other Visit Diagnoses    Need for influenza vaccination       Relevant Orders   Flu Vaccine QUAD 36+ mos IM (Completed)      Follow-up: Return in about 4 weeks (around 12/23/2019), or if symptoms worsen or fail to improve, for stress---- with pcp.  Ann Held, DO

## 2019-11-25 NOTE — Patient Instructions (Signed)
Stress, Adult Stress is a normal reaction to life events. Stress is what you feel when life demands more than you are used to, or more than you think you can handle. Some stress can be useful, such as studying for a test or meeting a deadline at work. Stress that occurs too often or for too long can cause problems. It can affect your emotional health and interfere with relationships and normal daily activities. Too much stress can weaken your body's defense system (immune system) and increase your risk for physical illness. If you already have a medical problem, stress can make it worse. What are the causes? All sorts of life events can cause stress. An event that causes stress for one person may not be stressful for another person. Major life events, whether positive or negative, commonly cause stress. Examples include:  Losing a job or starting a new job.  Losing a loved one.  Moving to a new town or home.  Getting married or divorced.  Having a baby.  Getting injured or sick. Less obvious life events can also cause stress, especially if they occur day after day or in combination with each other. Examples include:  Working long hours.  Driving in traffic.  Caring for children.  Being in debt.  Being in a difficult relationship. What are the signs or symptoms? Stress can cause emotional symptoms, including:  Anxiety. This is feeling worried, afraid, on edge, overwhelmed, or out of control.  Anger, including irritation or impatience.  Depression. This is feeling sad, down, helpless, or guilty.  Trouble focusing, remembering, or making decisions. Stress can cause physical symptoms, including:  Aches and pains. These may affect your head, neck, back, stomach, or other areas of your body.  Tight muscles or a clenched jaw.  Low energy.  Trouble sleeping. Stress can cause unhealthy behaviors, including:  Eating to feel better (overeating) or skipping meals.  Working too  much or putting off tasks.  Smoking, drinking alcohol, or using drugs to feel better. How is this diagnosed? Stress is diagnosed through an assessment by your health care provider. He or she may diagnose this condition based on:  Your symptoms and any stressful life events.  Your medical history.  Tests to rule out other causes of your symptoms. Depending on your condition, your health care provider may refer you to a specialist for further evaluation. How is this treated?  Stress management techniques are the recommended treatment for stress. Medicine is not typically recommended for the treatment of stress. Techniques to reduce your reaction to stressful life events include:  Stress identification. Monitor yourself for symptoms of stress and identify what causes stress for you. These skills may help you to avoid or prepare for stressful events.  Time management. Set your priorities, keep a calendar of events, and learn to say no. Taking these actions can help you avoid making too many commitments. Techniques for coping with stress include:  Rethinking the problem. Try to think realistically about stressful events rather than ignoring them or overreacting. Try to find the positives in a stressful situation rather than focusing on the negatives.  Exercise. Physical exercise can release both physical and emotional tension. The key is to find a form of exercise that you enjoy and do it regularly.  Relaxation techniques. These relax the body and mind. The key is to find one or more that you enjoy and use the techniques regularly. Examples include: ? Meditation, deep breathing, or progressive relaxation techniques. ? Yoga or   tai chi. ? Biofeedback, mindfulness techniques, or journaling. ? Listening to music, being out in nature, or participating in other hobbies.  Practicing a healthy lifestyle. Eat a balanced diet, drink plenty of water, limit or avoid caffeine, and get plenty of  sleep.  Having a strong support network. Spend time with family, friends, or other people you enjoy being around. Express your feelings and talk things over with someone you trust. Counseling or talk therapy with a mental health professional may be helpful if you are having trouble managing stress on your own. Follow these instructions at home: Lifestyle   Avoid drugs.  Do not use any products that contain nicotine or tobacco, such as cigarettes, e-cigarettes, and chewing tobacco. If you need help quitting, ask your health care provider.  Limit alcohol intake to no more than 1 drink a day for nonpregnant women and 2 drinks a day for men. One drink equals 12 oz of beer, 5 oz of wine, or 1 oz of hard liquor  Do not use alcohol or drugs to relax.  Eat a balanced diet that includes fresh fruits and vegetables, whole grains, lean meats, fish, eggs, and beans, and low-fat dairy. Avoid processed foods and foods high in added fat, sugar, and salt.  Exercise at least 30 minutes on 5 or more days each week.  Get 7-8 hours of sleep each night. General instructions   Practice stress management techniques as discussed with your health care provider.  Drink enough fluid to keep your urine clear or pale yellow.  Take over-the-counter and prescription medicines only as told by your health care provider.  Keep all follow-up visits as told by your health care provider. This is important. Contact a health care provider if:  Your symptoms get worse.  You have new symptoms.  You feel overwhelmed by your problems and can no longer manage them on your own. Get help right away if:  You have thoughts of hurting yourself or others. If you ever feel like you may hurt yourself or others, or have thoughts about taking your own life, get help right away. You can go to your nearest emergency department or call:  Your local emergency services (911 in the [place]  A suicide crisis helpline, such as the  [place] at (316) 250-6172. This is open 24 hours a day. Summary  Stress is a normal reaction to life events. It can cause problems if it happens too often or for too long.  Practicing stress management techniques is the best way to treat stress.  Counseling or talk therapy with a mental health professional may be helpful if you are having trouble managing stress on your own. This information is not intended to replace advice given to you by your health care provider. Make sure you discuss any questions you have with your health care provider. Document Revised: 08/14/2018 Document Reviewed: 03/06/2016 Elsevier Patient Education  [place].

## 2019-11-28 DIAGNOSIS — Z566 Other physical and mental strain related to work: Secondary | ICD-10-CM | POA: Insufficient documentation

## 2019-11-28 NOTE — Assessment & Plan Note (Signed)
Anxiety---- d/c buspar Start lexapro  F/u with pcp in 1 month

## 2019-11-30 MED FILL — LEVOCETIRIZINE 5 MG TABLET: 5 | 30 days supply | Qty: 30 | Fill #6

## 2019-12-22 MED FILL — ESCITALOPRAM 10 MG TABLET: 10 | 30 days supply | Qty: 30 | Fill #1

## 2019-12-24 MED FILL — LEVOCETIRIZINE 5 MG TABLET: 5 | 30 days supply | Qty: 30 | Fill #7

## 2019-12-24 MED FILL — RALOXIFENE HCL 60 MG TABS: 60 | 30 days supply | Qty: 30 | Fill #1

## 2019-12-26 ENCOUNTER — Other Ambulatory Visit (HOSPITAL_BASED_OUTPATIENT_CLINIC_OR_DEPARTMENT_OTHER): Payer: Self-pay | Admitting: Obstetrics and Gynecology

## 2019-12-27 MED FILL — ZOLPIDEM TARTRATE 10 MG TAB: 10 | 15 days supply | Qty: 15 | Fill #0

## 2020-01-19 ENCOUNTER — Other Ambulatory Visit (HOSPITAL_BASED_OUTPATIENT_CLINIC_OR_DEPARTMENT_OTHER): Payer: Self-pay | Admitting: Obstetrics and Gynecology

## 2020-01-19 LAB — HM DEXA SCAN

## 2020-01-19 MED FILL — ESCITALOPRAM 10 MG TABLET: 10 | 30 days supply | Qty: 30 | Fill #2

## 2020-01-19 MED FILL — LEVOCETIRIZINE 5 MG TABLET: 5 | 30 days supply | Qty: 30 | Fill #8

## 2020-01-19 MED FILL — RALOXIFENE HCL 60 MG TABS: 60 | 30 days supply | Qty: 30 | Fill #0

## 2020-01-24 LAB — HM PAP SMEAR: HM Pap smear: NORMAL

## 2020-02-11 MED FILL — RALOXIFENE HCL 60 MG TABS: 60 | 90 days supply | Qty: 90 | Fill #0

## 2020-02-18 ENCOUNTER — Other Ambulatory Visit: Payer: Self-pay | Admitting: Family Medicine

## 2020-02-18 DIAGNOSIS — Z566 Other physical and mental strain related to work: Secondary | ICD-10-CM

## 2020-02-18 MED FILL — ZOLPIDEM TARTRATE 10 MG TAB: 10 | 15 days supply | Qty: 15 | Fill #0

## 2020-02-18 MED FILL — ESCITALOPRAM 10 MG TABLET: 10 | 90 days supply | Qty: 90 | Fill #0

## 2020-02-21 ENCOUNTER — Other Ambulatory Visit: Payer: Self-pay

## 2020-02-21 ENCOUNTER — Other Ambulatory Visit: Payer: Self-pay | Admitting: Family Medicine

## 2020-02-21 ENCOUNTER — Telehealth (INDEPENDENT_AMBULATORY_CARE_PROVIDER_SITE_OTHER): Payer: BC Managed Care – PPO | Admitting: Family Medicine

## 2020-02-21 DIAGNOSIS — J069 Acute upper respiratory infection, unspecified: Secondary | ICD-10-CM

## 2020-02-21 MED ORDER — BENZONATATE 100 MG PO CAPS: 100.0000 mg | ORAL_CAPSULE | Freq: Three times a day (TID) | ORAL | 1 refills | Status: DC | PRN

## 2020-02-21 MED ORDER — FLOVENT HFA 220 MCG/ACT IN AERO: 1.0000 | INHALATION_SPRAY | Freq: Two times a day (BID) | RESPIRATORY_TRACT | 2 refills | Status: DC

## 2020-02-21 MED ORDER — ALBUTEROL SULFATE HFA 108 (90 BASE) MCG/ACT IN AERS: 2.0000 | INHALATION_SPRAY | Freq: Four times a day (QID) | RESPIRATORY_TRACT | 0 refills | Status: DC | PRN

## 2020-02-21 MED FILL — ALBUTEROL SULFATE HFA 108 (: 108 (90 BAS | 25 days supply | Qty: 9 | Fill #0

## 2020-02-21 MED FILL — FLOVENT HFA 220 MCG INHALER: 220 | 60 days supply | Qty: 12 | Fill #0

## 2020-02-21 MED FILL — BENZONATATE 100 MG CAPS: 100 | 13 days supply | Qty: 40 | Fill #0

## 2020-02-23 NOTE — Assessment & Plan Note (Signed)
She has been feeling poorly about 5 days now. Struggling with fatigue, dry mouth, cough sometimes productive, fevers, chills, headache and poor appetite. She performed a home COVID test and it was positive but then when she went for a PCR test it was negative. She can consider waiting a few days and then retesting but she is advised to assume she is positive and to quarantine for 5-10 days depending on her symptoms.  Monitor oxygen with pulse oximeter, want oxygen in 90s, if drops to 80s seek care.  Sent in Albuterol and tessalon perles to use prn and Flovent for twice a day use if breathing gets worse.   Take Multivitamin with minerals, selenium Vitamin D 1000-2000 IU daily Probiotic with lactobacillus and bifidophilus Asprin EC 81 mg daily Fish oil caps daily Melatonin 2-5 mg at bedtime

## 2020-02-23 NOTE — Progress Notes (Signed)
Virtual Visit via Video Note  I connected with Abigail [name] on 02/21/20 at  1:40 PM EST by a video enabled telemedicine application and verified that I am speaking with the correct person using two identifiers.  Location: Patient: home, patient and provider are in visit Provider: office   I discussed the limitations of evaluation and management by telemedicine and the availability of in person appointments. The patient expressed understanding and agreed to proceed. S Chism, CMA was able to get the patient set up on a video visit    Subjective:    Patient ID: Abigail [name], female    DOB: 1965-06-24, 55 y.o.   MRN: [id number]  Chief Complaint  Patient presents with  . Cough  . Nasal Congestion  . Headache  . Fatigue    Positive home test and neg. PCR    HPI Patient is in today for evaluation of respiratory illness. She has been feeling poorly about 5 days now. Struggling with fatigue, dry mouth, cough sometimes productive, fevers, chills, headache and poor appetite. She performed a home COVID test and it was positive but then when she went for a PCR test it was negative.   Past Medical History:  Diagnosis Date  . Allergic state 12/04/2010  . Allergy    seasonal- spring  . Anemia    mild  . Anxiety 02/11/2011  . BRCA gene mutation positive 11/21/2015  . Breast cancer ([place]) 06/05/11   RIGHT  BX,POSTERIOR ,SLIGHT LATERAL=FOCAL ATYPIA LOBULAR HYPERPLASIA IN BACKGROUND OF EXTENSIVE STROMAL FIBROSIS  . 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 Chicken pox as a child  . History of mononucleosis   . History of streptococcal pharyngitis   . Hot flashes   . Insomnia 02/11/2011  . Post-operative nausea and vomiting    past c-section  . Preventative health care 12/04/2010  . S/P chemotherapy, time since less than 4 weeks 05/23/2011   completed 4 doses docetaxel/cyclophosphamide  . Urticaria   . UTI (lower urinary tract infection)  11/28/2011  . Vaginitis 10/08/2011    Past Surgical History:  Procedure Laterality Date  . BREAST RECONSTRUCTION Bilateral 03/24/2012   Procedure: BREAST RECONSTRUCTION WITH PLACEMENT OF SILICONE GEL IMPLANTS;  Surgeon: Crissie Reese, MD;  Location: [facility];  Service: Plastics;  Laterality: Bilateral;  . [place]   X 1  . MASTECTOMY W/ SENTINEL NODE BIOPSY  08/07/2011   Procedure: MASTECTOMY WITH SENTINEL LYMPH NODE BIOPSY;  Surgeon: Merrie Roof, MD;  Location: [facility];  Service: General;  Laterality: Bilateral;  bilateral mastectomy  . MOUTH SURGERY  1997   gum surgery  . PORT-A-CATH REMOVAL  08/07/2011   Procedure: REMOVAL PORT-A-CATH;  Surgeon: Merrie Roof, MD;  Location: [facility];  Service: General;  Laterality: Right;  . PORTACATH PLACEMENT  03/20/2011   Procedure: INSERTION PORT-A-CATH;  Surgeon: Merrie Roof, MD;  Location: [facility];  Service: General;  Laterality: Right;  placement of port -Right Subclavian Vein  . TISSUE EXPANDER PLACEMENT  08/07/2011   Procedure: TISSUE EXPANDER;  Surgeon: Crissie Reese, MD;  Location: [facility];  Service: Plastics;  Laterality: Bilateral;  BILATERAL PLACEMENT OF TISSUE EXPANDERS FOR BREAST RECONSTRUCTION  . TUBAL LIGATION     1999  . VARICOSE VEIN SURGERY      vascular and vein    Family History  Adopted: Yes  Problem Relation Age of Onset  . Heart disease Mother   .  Heart disease Maternal Uncle   . Other Maternal Grandmother        mad cow disease  . Cancer Other        prostate cancer, diffuse in uncles, GF    Social History   Socioeconomic History  . Marital status: Married    Spouse name: Not on file  . Number of children: 2  . Years of education: Not on file  . Highest education level: Not on file  Occupational History  . Occupation: [employer]    Employer: [employer]  Tobacco Use  . Smoking status: Never Smoker  . Smokeless tobacco: Never Used  Substance and Sexual Activity  .  Alcohol use: Yes    Comment: occasionally- social  2 drinks  . Drug use: No  . Sexual activity: Yes    Birth control/protection: Surgical  Other Topics Concern  . Not on file  Social History Narrative  . Not on file   Social Determinants of Health   Financial Resource Strain: Not on file  Food Insecurity: Not on file  Transportation Needs: Not on file  Physical Activity: Not on file  Stress: Not on file  Social Connections: Not on file  Intimate Partner Violence: Not on file    Outpatient Medications Prior to Visit  Medication Sig Dispense Refill  . Calcium Carb-Cholecalciferol (CALCIUM + VITAMIN D3 PO) Take by mouth.    . escitalopram (LEXAPRO) 10 MG tablet TAKE 1 TABLET BY MOUTH EVERY NIGHT AT BEDTIME 90 tablet 1  . levocetirizine (XYZAL) 5 MG tablet Take 1 tablet (5 mg total) by mouth every evening. 30 tablet 11  . loratadine (CLARITIN) 10 MG tablet Take 10 mg by mouth 2 (two) times daily.     . montelukast (SINGULAIR) 10 MG tablet Take 1 tablet (10 mg total) by mouth at bedtime as needed. 30 tablet 2  . nitroGLYCERIN (NITROSTAT) 0.4 MG SL tablet Place 1 tablet (0.4 mg total) under the tongue every 5 (five) minutes as needed for chest pain. 50 tablet 0  . raloxifene (EVISTA) 60 MG tablet   12  . zolpidem (AMBIEN) 10 MG tablet Take 0.5 tablets (5 mg total) by mouth at bedtime as needed for sleep. For sleep, pt takes 1/3 of tablet 30 tablet 0   No facility-administered medications prior to visit.    Allergies  Allergen Reactions  . Penicillins Rash    Review of Systems  Constitutional: Positive for chills, fever and malaise/fatigue.  HENT: Positive for congestion.   Eyes: Negative for blurred vision.  Respiratory: Positive for cough and sputum production. Negative for shortness of breath.   Cardiovascular: Negative for chest pain, palpitations and leg swelling.  Gastrointestinal: Negative for abdominal pain, blood in stool and nausea.  Genitourinary: Negative for  dysuria and frequency.  Musculoskeletal: Positive for myalgias. Negative for falls.  Skin: Negative for rash.  Neurological: Positive for headaches. Negative for dizziness and loss of consciousness.  Endo/Heme/Allergies: Negative for environmental allergies.  Psychiatric/Behavioral: Negative for depression. The patient is not nervous/anxious.        Objective:    Physical Exam Constitutional:      Appearance: She is well-developed. She is not ill-appearing.  HENT:     Head: Normocephalic and atraumatic.  Neurological:     Mental Status: She is alert.  Psychiatric:        Behavior: Behavior normal.     LMP 03/02/2011  Wt Readings from Last 3 Encounters:  11/25/19 140 lb 3.2 oz (63.6  kg)  06/09/19 140 lb (63.5 kg)  05/27/19 135 lb 14.4 oz (61.6 kg)    Diabetic Foot Exam - Simple   No data filed    Lab Results  Component Value Date   WBC 4.8 12/01/2017   HGB 13.3 12/01/2017   HCT 40.1 12/01/2017   PLT 254.0 12/01/2017   GLUCOSE 99 04/04/2018   CHOL 155 12/01/2017   TRIG 53.0 12/01/2017   HDL 68.80 12/01/2017   LDLCALC 75 12/01/2017   ALT 34 (H) 04/04/2018   AST 39 (H) 04/04/2018   NA 141 04/04/2018   K 4.1 04/04/2018   CL 104 04/04/2018   CREATININE 0.76 04/04/2018   BUN 20 04/04/2018   CO2 30 04/04/2018   TSH 1.26 12/01/2017   HGBA1C 5.7 12/01/2017    Lab Results  Component Value Date   TSH 1.26 12/01/2017   Lab Results  Component Value Date   WBC 4.8 12/01/2017   HGB 13.3 12/01/2017   HCT 40.1 12/01/2017   MCV 94.2 12/01/2017   PLT 254.0 12/01/2017   Lab Results  Component Value Date   NA 141 04/04/2018   K 4.1 04/04/2018   CHLORIDE 106 04/08/2016   CO2 30 04/04/2018   GLUCOSE 99 04/04/2018   BUN 20 04/04/2018   CREATININE 0.76 04/04/2018   BILITOT 0.5 04/04/2018   ALKPHOS 62 12/01/2017   AST 39 (H) 04/04/2018   ALT 34 (H) 04/04/2018   PROT 7.2 04/04/2018   ALBUMIN 4.6 12/01/2017   CALCIUM 9.8 04/04/2018   ANIONGAP 7 04/08/2016    EGFR 79 (L) 04/08/2016   GFR 80.09 12/01/2017   Lab Results  Component Value Date   CHOL 155 12/01/2017   Lab Results  Component Value Date   HDL 68.80 12/01/2017   Lab Results  Component Value Date   LDLCALC 75 12/01/2017   Lab Results  Component Value Date   TRIG 53.0 12/01/2017   Lab Results  Component Value Date   CHOLHDL 2 12/01/2017   Lab Results  Component Value Date   HGBA1C 5.7 12/01/2017       Assessment & Plan:   Problem List Items Addressed This Visit    Upper respiratory virus    She has been feeling poorly about 5 days now. Struggling with fatigue, dry mouth, cough sometimes productive, fevers, chills, headache and poor appetite. She performed a home COVID test and it was positive but then when she went for a PCR test it was negative. She can consider waiting a few days and then retesting but she is advised to assume she is positive and to quarantine for 5-10 days depending on Abigail [name] symptoms.  Monitor oxygen with pulse oximeter, want oxygen in 90s, if drops to 80s seek care.  Sent in Albuterol and tessalon perles to use prn and Flovent for twice a day use if breathing gets worse.   Take Multivitamin with minerals, selenium Vitamin D 1000-2000 IU daily Probiotic with lactobacillus and bifidophilus Asprin EC 81 mg daily Fish oil caps daily Melatonin 2-5 mg at bedtime           I am having Lorina A. Gruszka start on Flovent HFA, albuterol, and benzonatate. I am also having Abigail [name] maintain Abigail [name] zolpidem, loratadine, raloxifene, Calcium Carb-Cholecalciferol (CALCIUM + VITAMIN D3 PO), levocetirizine, montelukast, nitroGLYCERIN, and escitalopram.  Meds ordered this encounter  Medications  . fluticasone (FLOVENT HFA) 220 MCG/ACT inhaler    Sig: Inhale 1 puff into the lungs in the morning and at bedtime.  Dispense:  1 each    Refill:  2  . albuterol (VENTOLIN HFA) 108 (90 Base) MCG/ACT inhaler    Sig: Inhale 2 puffs into the lungs every 6 (six) hours as  needed for wheezing or shortness of breath.    Dispense:  8 g    Refill:  0  . benzonatate (TESSALON PERLES) 100 MG capsule    Sig: Take 1 capsule (100 mg total) by mouth 3 (three) times daily as needed for cough.    Dispense:  40 capsule    Refill:  1     I discussed the assessment and treatment plan with the patient. The patient was provided an opportunity to ask questions and all were answered. The patient agreed with the plan and demonstrated an understanding of the instructions.   The patient was advised to call back or seek an in-person evaluation if the symptoms worsen or if the condition fails to improve as anticipated.  I provided 15 minutes of non-face-to-face time during this encounter.   Penni Homans, MD

## 2020-02-24 ENCOUNTER — Encounter: Payer: Self-pay | Admitting: Family Medicine

## 2020-02-25 MED FILL — LEVOCETIRIZINE 5 MG TABLET: 5 | 30 days supply | Qty: 30 | Fill #9

## 2020-03-07 IMAGING — US US SOFT TISSUE HEAD/NECK
1 series · 14 of 16 positions shown · non-contrast
Comparison: None.

CLINICAL DATA: Possible palpable cervical lymphadenopathy in the
left neck. History of breast carcinoma.

EXAM:
ULTRASOUND OF HEAD/NECK SOFT TISSUES
TECHNIQUE: Ultrasound examination of the head and neck soft tissues was
performed in the area of clinical concern.

[Series 1: us soft tissue head/neck · 0.04mm/px · 14 of 16 slices shown]
[im 1/16]
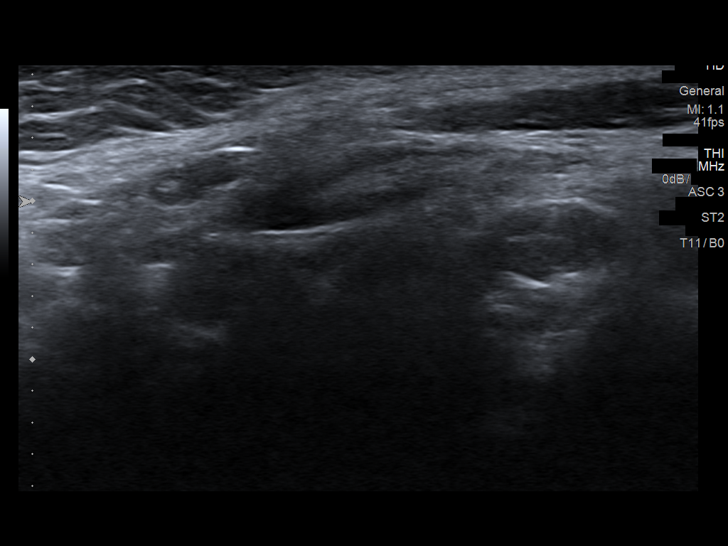
[im 2/16]
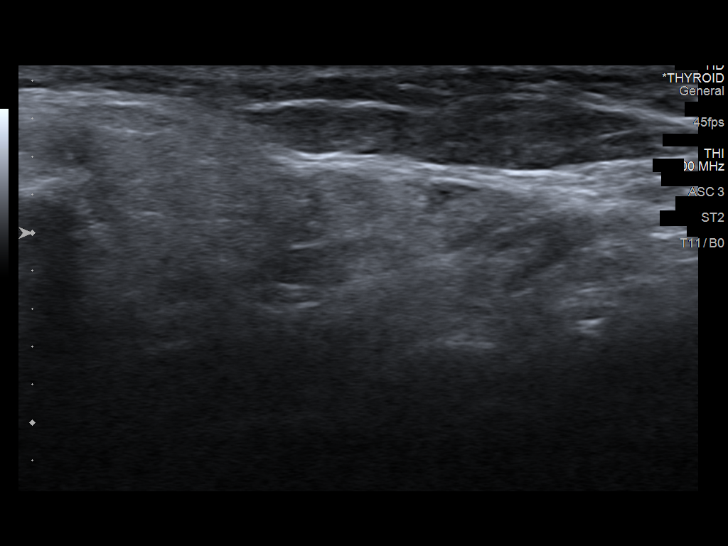
[im 3/16]
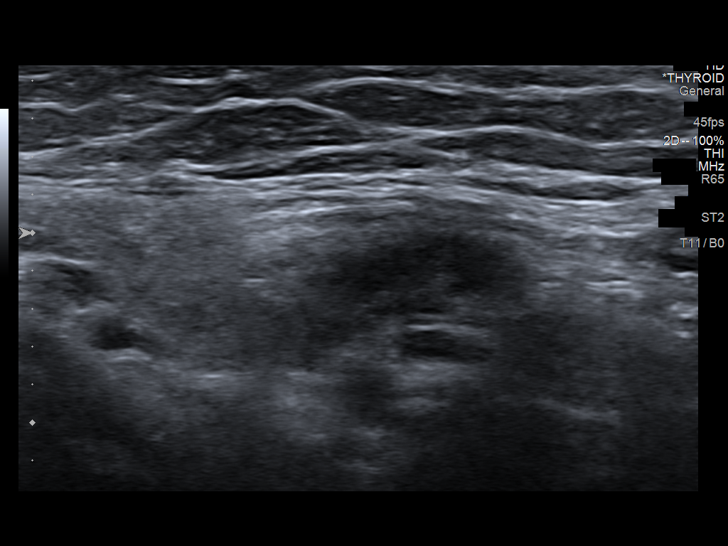
[im 5/16]
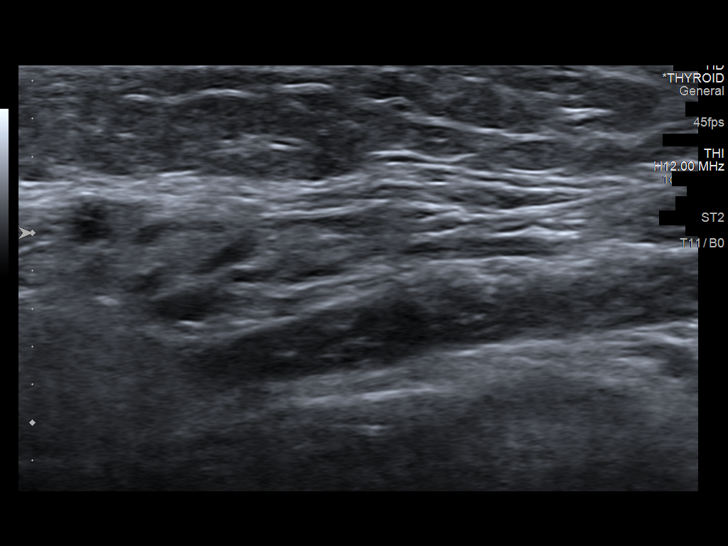
[im 6/16]
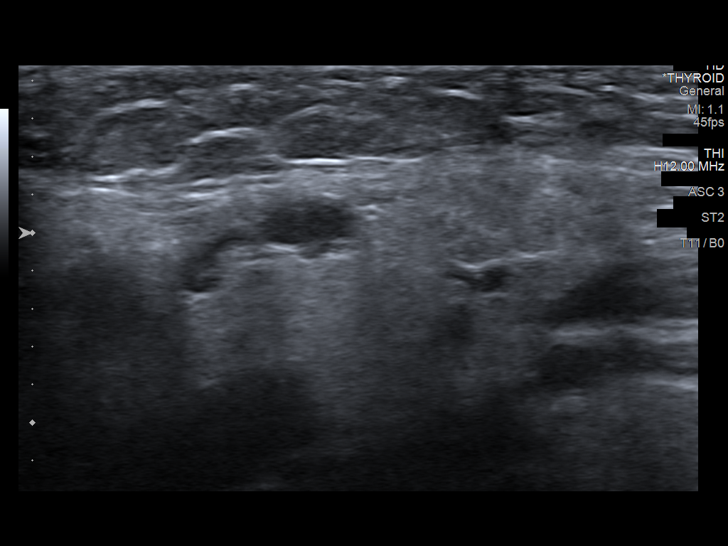
[im 7/16]
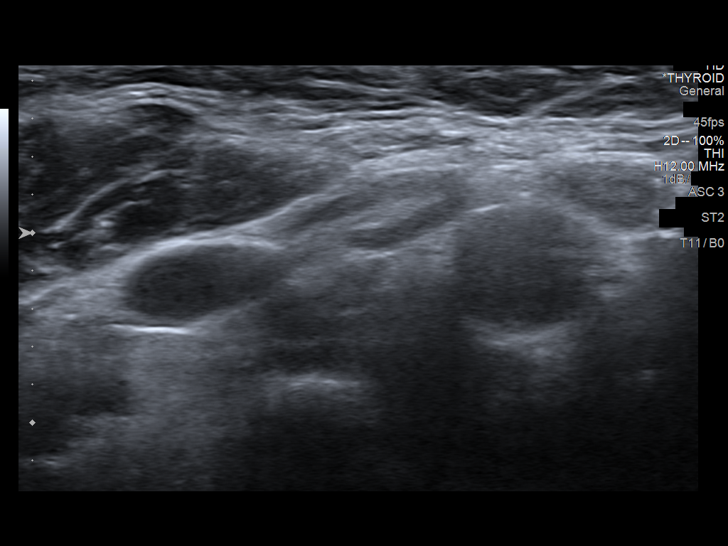
[im 8/16]
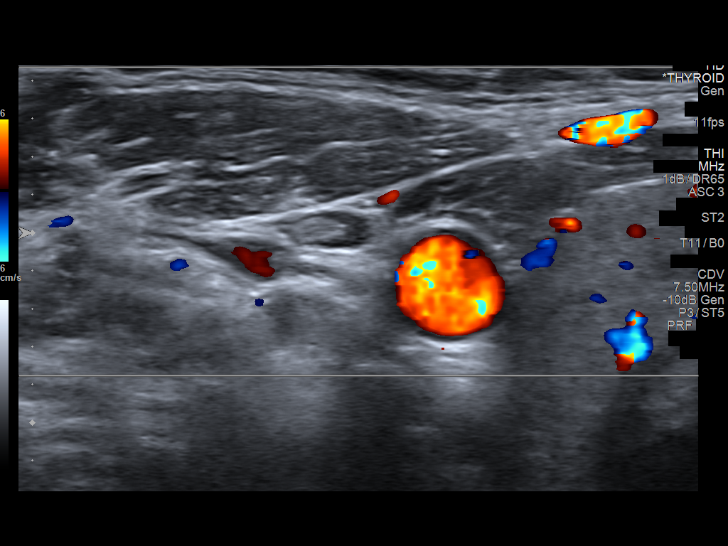
[im 9/16]
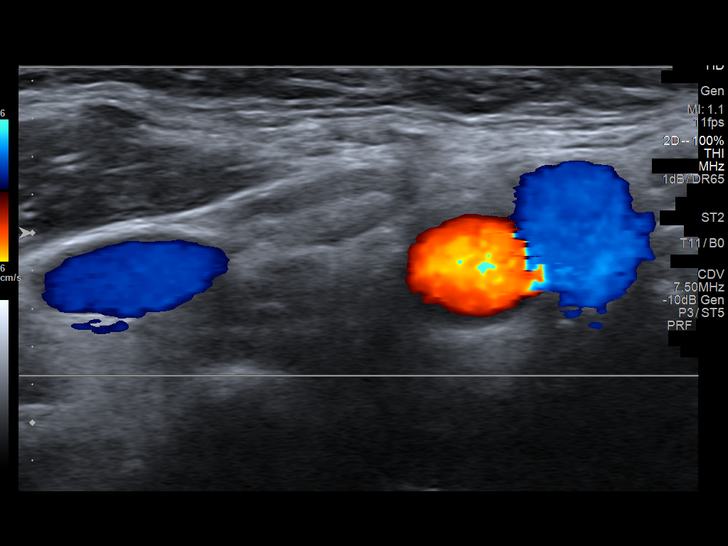
[im 10/16]
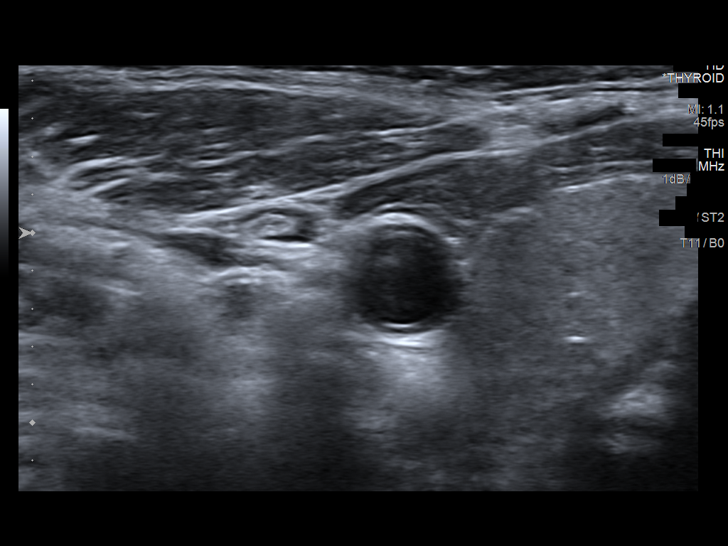
[im 11/16]
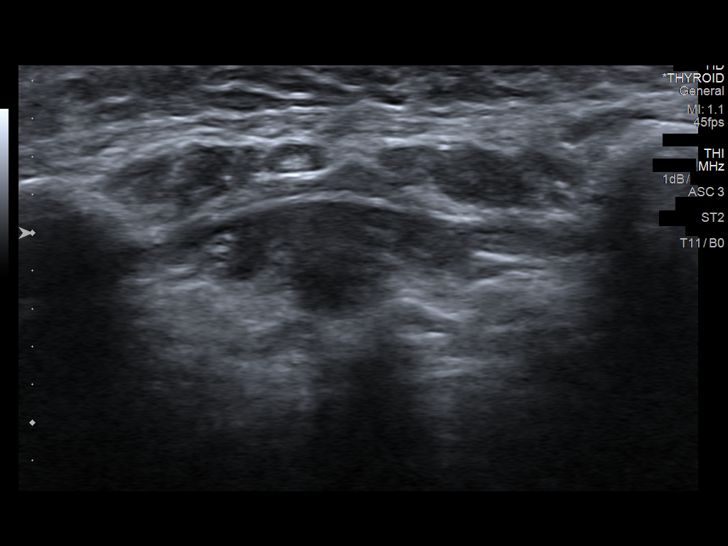
[im 13/16]
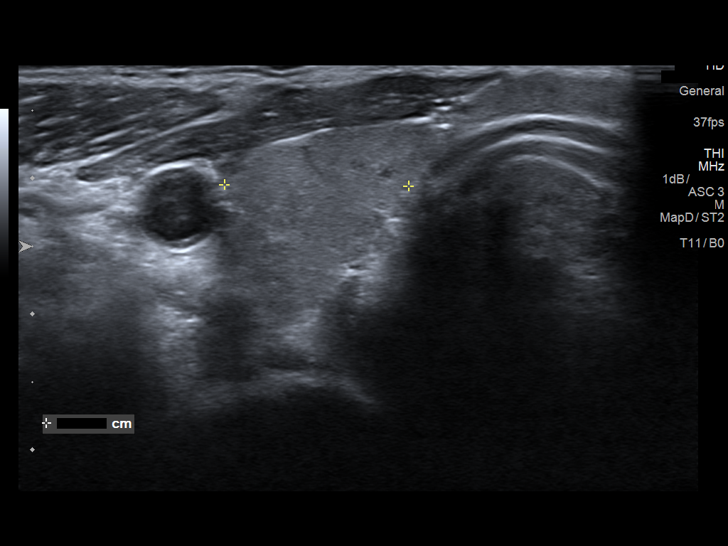
[im 14/16]
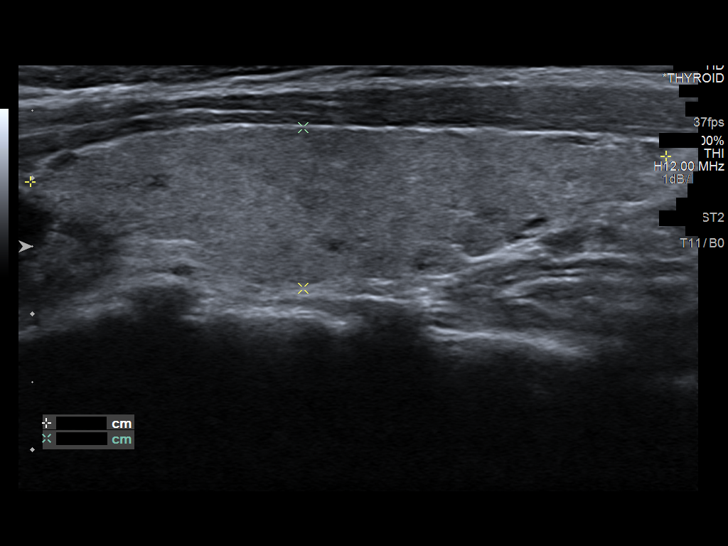
[im 15/16]
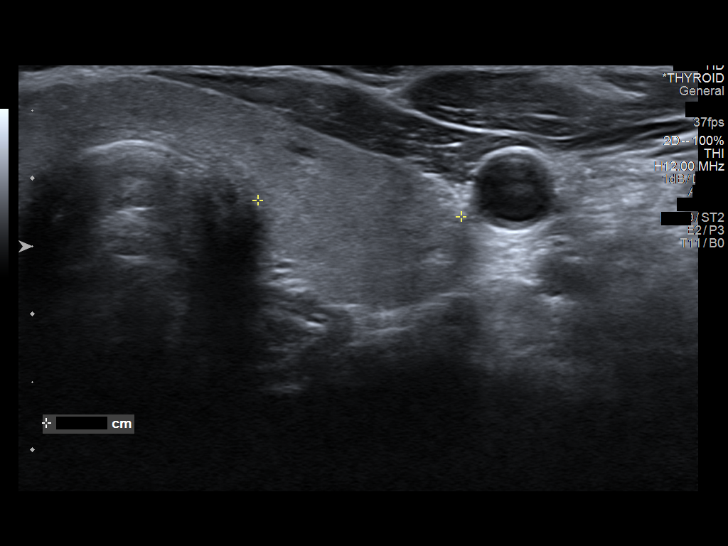
[im 16/16]
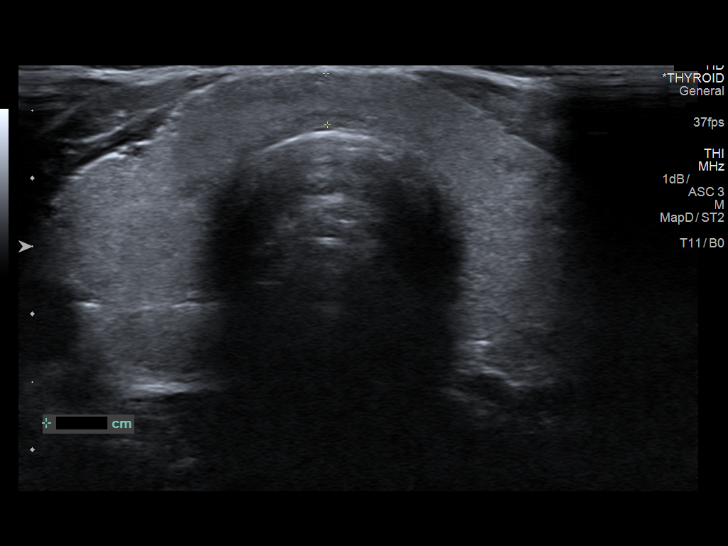

[14 of 16 positions shown; findings below may reference images not displayed]

FINDINGS: Ultrasound of the neck demonstrates no enlarged cervical lymph
nodes. The thyroid gland appears unremarkable without focal solid
nodule. No abnormal soft tissue mass or fluid collection identified.
IMPRESSION: No enlarged lymph nodes identified in the neck.

## 2020-03-30 MED FILL — LEVOCETIRIZINE 5 MG TABLET: 5 | 30 days supply | Qty: 30 | Fill #10

## 2020-04-21 MED FILL — ZOLPIDEM TARTRATE 10 MG TAB: 10 | 15 days supply | Qty: 15 | Fill #1

## 2020-04-28 MED FILL — LEVOCETIRIZINE 5 MG TABLET: 5 | 30 days supply | Qty: 30 | Fill #11

## 2020-05-23 ENCOUNTER — Other Ambulatory Visit: Payer: Self-pay | Admitting: Medical

## 2020-05-23 ENCOUNTER — Other Ambulatory Visit (HOSPITAL_BASED_OUTPATIENT_CLINIC_OR_DEPARTMENT_OTHER): Payer: Self-pay

## 2020-05-23 DIAGNOSIS — J309 Allergic rhinitis, unspecified: Secondary | ICD-10-CM

## 2020-05-23 MED FILL — Raloxifene HCl Tab 60 MG: ORAL | 30 days supply | Qty: 30 | Fill #0 | Status: AC

## 2020-05-24 ENCOUNTER — Other Ambulatory Visit (HOSPITAL_BASED_OUTPATIENT_CLINIC_OR_DEPARTMENT_OTHER): Payer: Self-pay

## 2020-05-24 MED ORDER — LEVOCETIRIZINE DIHYDROCHLORIDE 5 MG PO TABS
ORAL_TABLET | Freq: Every evening | ORAL | 11 refills | Status: DC
  Filled 2020-05-24: qty 30, 30d supply, fill #0
  Filled 2020-06-19: qty 30, 30d supply, fill #1
  Filled 2020-07-19: qty 30, 30d supply, fill #2
  Filled 2020-08-21: qty 30, 30d supply, fill #3
  Filled 2020-09-18: qty 30, 30d supply, fill #4
  Filled 2020-10-27: qty 30, 30d supply, fill #5
  Filled 2020-11-21: qty 30, 30d supply, fill #6
  Filled 2020-12-25: qty 30, 30d supply, fill #7
  Filled 2021-01-23: qty 30, 30d supply, fill #8
  Filled 2021-03-01: qty 30, 30d supply, fill #9
  Filled 2021-03-31: qty 30, 30d supply, fill #10
  Filled 2021-04-30: qty 30, 30d supply, fill #11

## 2020-05-29 ENCOUNTER — Other Ambulatory Visit (HOSPITAL_BASED_OUTPATIENT_CLINIC_OR_DEPARTMENT_OTHER): Payer: Self-pay

## 2020-05-29 MED FILL — Escitalopram Oxalate Tab 10 MG (Base Equiv): ORAL | 90 days supply | Qty: 90 | Fill #0 | Status: AC

## 2020-06-12 ENCOUNTER — Other Ambulatory Visit (HOSPITAL_BASED_OUTPATIENT_CLINIC_OR_DEPARTMENT_OTHER): Payer: Self-pay

## 2020-06-12 ENCOUNTER — Encounter: Payer: Self-pay | Admitting: Family Medicine

## 2020-06-12 ENCOUNTER — Ambulatory Visit (INDEPENDENT_AMBULATORY_CARE_PROVIDER_SITE_OTHER): Payer: BC Managed Care – PPO | Admitting: Family Medicine

## 2020-06-12 ENCOUNTER — Other Ambulatory Visit: Payer: Self-pay

## 2020-06-12 VITALS — BP 106/60 | HR 63 | Temp 97.9°F | Resp 16 | Ht 66.0 in | Wt 142.0 lb

## 2020-06-12 DIAGNOSIS — F419 Anxiety disorder, unspecified: Secondary | ICD-10-CM

## 2020-06-12 DIAGNOSIS — K5900 Constipation, unspecified: Secondary | ICD-10-CM

## 2020-06-12 DIAGNOSIS — Z0000 Encounter for general adult medical examination without abnormal findings: Secondary | ICD-10-CM

## 2020-06-12 DIAGNOSIS — M810 Age-related osteoporosis without current pathological fracture: Secondary | ICD-10-CM

## 2020-06-12 DIAGNOSIS — K635 Polyp of colon: Secondary | ICD-10-CM

## 2020-06-12 DIAGNOSIS — D649 Anemia, unspecified: Secondary | ICD-10-CM | POA: Diagnosis not present

## 2020-06-12 DIAGNOSIS — Z1159 Encounter for screening for other viral diseases: Secondary | ICD-10-CM | POA: Diagnosis not present

## 2020-06-12 DIAGNOSIS — T7840XD Allergy, unspecified, subsequent encounter: Secondary | ICD-10-CM

## 2020-06-12 LAB — CBC WITH DIFFERENTIAL/PLATELET
Basophils Absolute: 0 10*3/uL (ref 0.0–0.1)
Basophils Relative: 0.7 % (ref 0.0–3.0)
Eosinophils Absolute: 0.2 10*3/uL (ref 0.0–0.7)
Eosinophils Relative: 5.6 % — ABNORMAL HIGH (ref 0.0–5.0)
HCT: 37.4 % (ref 36.0–46.0)
Hemoglobin: 12.6 g/dL (ref 12.0–15.0)
Lymphocytes Relative: 41.4 % (ref 12.0–46.0)
Lymphs Abs: 1.5 10*3/uL (ref 0.7–4.0)
MCHC: 33.7 g/dL (ref 30.0–36.0)
MCV: 94.7 fl (ref 78.0–100.0)
Monocytes Absolute: 0.3 10*3/uL (ref 0.1–1.0)
Monocytes Relative: 8.2 % (ref 3.0–12.0)
Neutro Abs: 1.6 10*3/uL (ref 1.4–7.7)
Neutrophils Relative %: 44.1 % (ref 43.0–77.0)
Platelets: 212 10*3/uL (ref 150.0–400.0)
RBC: 3.95 Mil/uL (ref 3.87–5.11)
RDW: 12.1 % (ref 11.5–15.5)
WBC: 3.6 10*3/uL — ABNORMAL LOW (ref 4.0–10.5)

## 2020-06-12 LAB — LIPID PANEL
Cholesterol: 166 mg/dL (ref 0–200)
HDL: 72.2 mg/dL (ref >39.00)
LDL Cholesterol: 79 mg/dL (ref 0–99)
NonHDL: 93.8
Total CHOL/HDL Ratio: 2
Triglycerides: 75 mg/dL (ref 0.0–149.0)
VLDL: 15 mg/dL (ref 0.0–40.0)

## 2020-06-12 LAB — COMPREHENSIVE METABOLIC PANEL
ALT: 24 U/L (ref 0–35)
AST: 22 U/L (ref 0–37)
Albumin: 4.5 g/dL (ref 3.5–5.2)
Alkaline Phosphatase: 48 U/L (ref 39–117)
BUN: 19 mg/dL (ref 6–23)
CO2: 32 mEq/L (ref 19–32)
Calcium: 9.8 mg/dL (ref 8.4–10.5)
Chloride: 104 mEq/L (ref 96–112)
Creatinine, Ser: 0.83 mg/dL (ref 0.40–1.20)
GFR: 79.92 mL/min (ref >60.00)
Glucose, Bld: 85 mg/dL (ref 70–99)
Potassium: 4.3 mEq/L (ref 3.5–5.1)
Sodium: 143 mEq/L (ref 135–145)
Total Bilirubin: 0.6 mg/dL (ref 0.2–1.2)
Total Protein: 6.7 g/dL (ref 6.0–8.3)

## 2020-06-12 LAB — TSH: TSH: 1.74 u[IU]/mL (ref 0.35–4.50)

## 2020-06-12 MED ORDER — ZOSTER VAC RECOMB ADJUVANTED 50 MCG/0.5ML IM SUSR
INTRAMUSCULAR | 1 refills | Status: DC
  Filled 2020-06-12: qty 1, 1d supply, fill #0

## 2020-06-12 MED ORDER — FLUOXETINE HCL 10 MG PO TABS
10.0000 mg | ORAL_TABLET | Freq: Every day | ORAL | 1 refills | Status: DC
  Filled 2020-06-12: qty 90, 90d supply, fill #0

## 2020-06-12 NOTE — Assessment & Plan Note (Signed)
Encouraged to get adequate exercise, calcium and vitamin d intake 

## 2020-06-12 NOTE — Progress Notes (Signed)
Patient ID: Abigail [name], female    DOB: Feb 04, 1965  Age: 55 y.o. MRN: [id number]    Subjective:  Subjective  HPI SHYAN SCALISI presents for comprehensive physical exam today and follow up on management of chronic concerns of her allergies and her medication lexopro 10 mg. She reports that her allergies are getting progressively worse. She states that her allergies used to be only seasonal, but recently have been getting more frequent. She reports that she is on two different allergy medications. She states taking Claritin 10 mg BID and Singulair 10 mg os. She reports at some point her allergies got worse to the point she noticed red streaks on her skin. She denies any blurred vision, chest pain, SOB, fever, abdominal pain, cough, chills, sore throat, dysuria, urinary incontinence, back pain, HA, or N/VD.   Review of Systems  Constitutional: Negative for chills, fatigue and fever.  HENT: Negative for congestion, rhinorrhea, sinus pressure, sinus pain and sore throat.   Eyes: Negative for pain.  Respiratory: Negative for cough and shortness of breath.   Cardiovascular: Negative for chest pain, palpitations and leg swelling.  Gastrointestinal: Negative for abdominal pain, blood in stool, diarrhea, nausea and vomiting.  Genitourinary: Positive for decreased urine volume. Negative for flank pain, frequency, vaginal bleeding and vaginal discharge.  Musculoskeletal: Negative for back pain.  Neurological: Negative for headaches.    History Past Medical History:  Diagnosis Date  . Allergic state 12/04/2010  . Allergy    seasonal- spring  . Anemia    mild  . Anxiety 02/11/2011  . BRCA gene mutation positive 11/21/2015  . Breast cancer ([place]) 06/05/11   RIGHT  BX,POSTERIOR ,SLIGHT LATERAL=FOCAL ATYPIA LOBULAR HYPERPLASIA IN BACKGROUND OF EXTENSIVE STROMAL FIBROSIS  . 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 Chicken pox as a child  . History of  mononucleosis   . History of streptococcal pharyngitis   . Hot flashes   . Insomnia 02/11/2011  . Post-operative nausea and vomiting    past c-section  . Preventative health care 12/04/2010  . S/P chemotherapy, time since less than 4 weeks 05/23/2011   completed 4 doses docetaxel/cyclophosphamide  . Urticaria   . UTI (lower urinary tract infection) 11/28/2011  . Vaginitis 10/08/2011    She has a past surgical history that includes Mouth surgery (1997); Cesarean section (1999); Varicose vein surgery; Portacath placement (03/20/2011); Tubal ligation; Port-a-cath removal (08/07/2011); Mastectomy w/ sentinel node biopsy (08/07/2011); Tissue expander placement (08/07/2011); and Breast reconstruction (Bilateral, 03/24/2012).   Her family history includes Cancer in an other family member; Heart disease in her maternal uncle and mother; Other in her maternal grandmother. She was adopted.She reports that she has never smoked. She has never used smokeless tobacco. She reports current alcohol use. She reports that she does not use drugs.  Current Outpatient Medications on File Prior to Visit  Medication Sig Dispense Refill  . Calcium Carb-Cholecalciferol (CALCIUM + VITAMIN D3 PO) Take by mouth.    . levocetirizine (XYZAL) 5 MG tablet TAKE 1 TABLET BY MOUTH EVERY EVENING 30 tablet 11  . loratadine (CLARITIN) 10 MG tablet Take 10 mg by mouth 2 (two) times daily.     . montelukast (SINGULAIR) 10 MG tablet Take 1 tablet (10 mg total) by mouth at bedtime as needed. 30 tablet 2  . raloxifene (EVISTA) 60 MG tablet TAKE 1 TABLET BY MOUTH ONCE DAILY 30 tablet 1  . zolpidem (AMBIEN) 10 MG tablet TAKE 1 TABLET  BY MOUTH EVERY NIGHT AT BEDTIME AS NEEDED 15 tablet 3   No current facility-administered medications on file prior to visit.     Objective:  Objective  Physical Exam Constitutional:      General: She is not in acute distress.    Appearance: Normal appearance. She is not ill-appearing or toxic-appearing.   HENT:     Head: Normocephalic and atraumatic.     Right Ear: Tympanic membrane, ear canal and external ear normal.     Left Ear: Tympanic membrane, ear canal and external ear normal.     Nose: No congestion or rhinorrhea.  Eyes:     Extraocular Movements: Extraocular movements intact.     Right eye: No nystagmus.     Left eye: No nystagmus.     Pupils: Pupils are equal, round, and reactive to light.  Cardiovascular:     Rate and Rhythm: Normal rate and regular rhythm.     Pulses: Normal pulses.     Heart sounds: Normal heart sounds. No murmur heard.   Pulmonary:     Effort: Pulmonary effort is normal. No respiratory distress.     Breath sounds: Normal breath sounds. No wheezing, rhonchi or rales.  Abdominal:     General: Bowel sounds are normal.     Palpations: Abdomen is soft. There is no mass.     Tenderness: There is no abdominal tenderness. There is no guarding.     Hernia: No hernia is present.  Musculoskeletal:        General: Normal range of motion.     Cervical back: Normal range of motion and neck supple.  Skin:    General: Skin is warm and dry.  Neurological:     Mental Status: She is alert and oriented to person, place, and time.  Psychiatric:        Behavior: Behavior normal.    BP 106/60   Pulse 63   Temp 97.9 F (36.6 C)   Resp 16   Ht 5' 6"  (1.676 m)   Wt 142 lb (64.4 kg)   LMP 03/02/2011   SpO2 99%   BMI 22.92 kg/m  Wt Readings from Last 3 Encounters:  06/12/20 142 lb (64.4 kg)  11/25/19 140 lb 3.2 oz (63.6 kg)  06/09/19 140 lb (63.5 kg)     Lab Results  Component Value Date   WBC 3.6 (L) 06/12/2020   HGB 12.6 06/12/2020   HCT 37.4 06/12/2020   PLT 212.0 06/12/2020   GLUCOSE 85 06/12/2020   CHOL 166 06/12/2020   TRIG 75.0 06/12/2020   HDL 72.20 06/12/2020   LDLCALC 79 06/12/2020   ALT 24 06/12/2020   AST 22 06/12/2020   NA 143 06/12/2020   K 4.3 06/12/2020   CL 104 06/12/2020   CREATININE 0.83 06/12/2020   BUN 19 06/12/2020    CO2 32 06/12/2020   TSH 1.74 06/12/2020   HGBA1C 5.7 12/01/2017    ECHOCARDIOGRAM COMPLETE  Result Date: 06/16/2019    ECHOCARDIOGRAM REPORT   Patient Name:   Hervey Ard Mcdonell Date of Exam: 06/16/2019 Medical Rec #:  [id number]        Height:       66.0 in Accession #:    [id number]       Weight:       140.0 lb Date of Birth:  October 20, 1965       BSA:          1.719 m Patient Age:    38  years         BP:           100/76 mmHg Patient Gender: F                HR:           67 bpm. Exam Location:  [facility] Procedure: 2D Echo, Cardiac Doppler and Color Doppler Indications:    R07.9* Chest pain, unspecified,; R00.2 Palpitations  History:        Patient has no prior history of Echocardiogram examinations.                 Signs/Symptoms:Chest Pain and palpitations; Risk                 Factors:Non-Smoker. Patient has been under lots of stress and                 has noticed left chest tightness w/ occasional palpitations.  Sonographer:    Salvadore Dom RVT, RDCS (AE), RDMS Referring Phys: [phone] [place]  Sonographer Comments: Suboptimal parasternal window. Bilateral silicone implants 1771. Limited SAX views due to implants. IMPRESSIONS  1. Left ventricular ejection fraction, by estimation, is 60 to 65%. The left ventricle has normal function. The left ventricle has no regional wall motion abnormalities. Left ventricular diastolic parameters were normal.  2. Right ventricular systolic function is normal. The right ventricular size is normal. There is normal pulmonary artery systolic pressure.  3. The mitral valve is normal in structure. No evidence of mitral valve regurgitation. No evidence of mitral stenosis.  4. The aortic valve is normal in structure. Aortic valve regurgitation is not visualized. No aortic stenosis is present.  5. The inferior vena cava is normal in size with greater than 50% respiratory variability, suggesting right atrial pressure of 3 mmHg.  6. Evidence of atrial level shunting  detected by color flow Doppler. There is a small patent foramen ovale with predominantly left to right shunting across the atrial septum. FINDINGS  Left Ventricle: Left ventricular ejection fraction, by estimation, is 60 to 65%. The left ventricle has normal function. The left ventricle has no regional wall motion abnormalities. The left ventricular internal cavity size was normal in size. There is  no left ventricular hypertrophy. Left ventricular diastolic parameters were normal. Right Ventricle: The right ventricular size is normal. No increase in right ventricular wall thickness. Right ventricular systolic function is normal. There is normal pulmonary artery systolic pressure. The tricuspid regurgitant velocity is 2.39 m/s, and  with an assumed right atrial pressure of 10 mmHg, the estimated right ventricular systolic pressure is 16.5 mmHg. Left Atrium: Left atrial size was normal in size. Right Atrium: Right atrial size was normal in size. Pericardium: There is no evidence of pericardial effusion. Mitral Valve: The mitral valve is normal in structure. Normal mobility of the mitral valve leaflets. No evidence of mitral valve regurgitation. No evidence of mitral valve stenosis. Tricuspid Valve: The tricuspid valve is normal in structure. Tricuspid valve regurgitation is mild . No evidence of tricuspid stenosis. Aortic Valve: The aortic valve is normal in structure. Aortic valve regurgitation is not visualized. No aortic stenosis is present. Aortic valve mean gradient measures 3.0 mmHg. Aortic valve peak gradient measures 5.1 mmHg. Aortic valve area, by VTI measures 2.31 cm. Pulmonic Valve: The pulmonic valve was normal in structure. Pulmonic valve regurgitation is not visualized. No evidence of pulmonic stenosis. Aorta: The aortic root is normal in size and structure. Venous: The inferior  vena cava is normal in size with greater than 50% respiratory variability, suggesting right atrial pressure of 3 mmHg.  IAS/Shunts: Evidence of atrial level shunting detected by color flow Doppler. A small patent foramen ovale is detected with predominantly left to right shunting across the atrial septum.  LEFT VENTRICLE PLAX 2D LVIDd:         4.01 cm     Diastology LVIDs:         2.66 cm     LV e' lateral:   11.90 cm/s LV PW:         0.94 cm     LV E/e' lateral: 6.1 LV IVS:        0.58 cm     LV e' medial:    7.40 cm/s LVOT diam:     1.80 cm     LV E/e' medial:  9.7 LV SV:         60 LV SV Index:   35 LVOT Area:     2.54 cm  LV Volumes (MOD) LV vol d, MOD A2C: 66.5 ml LV vol d, MOD A4C: 58.6 ml LV vol s, MOD A2C: 22.0 ml LV vol s, MOD A4C: 20.0 ml LV SV MOD A2C:     44.5 ml LV SV MOD A4C:     58.6 ml LV SV MOD BP:      47.1 ml RIGHT VENTRICLE RV S prime:     13.70 cm/s LEFT ATRIUM           Index       RIGHT ATRIUM           Index LA diam:      2.70 cm 1.57 cm/m  RA Area:     17.30 cm LA Vol (A4C): 40.1 ml 23.33 ml/m RA Volume:   46.80 ml  27.23 ml/m  AORTIC VALVE                   PULMONIC VALVE AV Area (Vmax):    2.39 cm    PV Vmax:       0.46 m/s AV Area (Vmean):   2.30 cm    PV Peak grad:  0.9 mmHg AV Area (VTI):     2.31 cm AV Vmax:           113.00 cm/s AV Vmean:          77.400 cm/s AV VTI:            0.258 m AV Peak Grad:      5.1 mmHg AV Mean Grad:      3.0 mmHg LVOT Vmax:         106.00 cm/s LVOT Vmean:        69.900 cm/s LVOT VTI:          0.234 m LVOT/AV VTI ratio: 0.91  AORTA Ao Root diam: 2.90 cm MITRAL VALVE               TRICUSPID VALVE MV Area (PHT): 4.06 cm    TR Peak grad:   22.8 mmHg MV Decel Time: 187 msec    TR Vmax:        239.00 cm/s MV E velocity: 72.00 cm/s MV A velocity: 68.10 cm/s  SHUNTS MV E/A ratio:  1.06        Systemic VTI:  0.23 m  Systemic Diam: 1.80 cm Jenne Campus MD Electronically signed by Jenne Campus MD Signature Date/Time: 06/16/2019/12:40:15 PM    Final      Assessment & Plan:  Plan    Meds ordered this encounter  Medications  . FLUoxetine  (PROZAC) 10 MG tablet    Sig: Take 1 tablet (10 mg total) by mouth daily.    Dispense:  90 tablet    Refill:  1    Problem List Items Addressed This Visit    Allergy    Worsening and now year round. Using Zyxal daily and Claritin prn, singulair daily and that controls it. Is not needing Flonase. Notes some clear rhinorrhea and pruritus when it flares.       Preventative health care    Patient encouraged to maintain heart healthy diet, regular exercise, adequate sleep. Consider daily probiotics. Take medications as prescribed. Labs reviewed. Her mammograms are completed routinely with oncology      Relevant Orders   Lipid panel (Completed)   Anxiety    She felt well on the Buspar but she got a rash red and itchy on lower arms and face. So she switched to Lexapro  But she had a lot of anti cholinergic reactions with dry mouth, constipation, decreased urination, will try switching to Fluoxetine 10 mg DAILY      Relevant Medications   FLUoxetine (PROZAC) 10 MG tablet   Anemia - Primary   Relevant Orders   CBC with Differential/Platelet (Completed)   Osteoporosis    Encouraged to get adequate exercise, calcium and vitamin d intake      Relevant Orders   Comprehensive metabolic panel (Completed)   Vitamin D 1,25 dihydroxy   Colon polyp    With a history of polyps she is overdue for repeat colonoscopy. She agrees to contact gastroenterology to schedule her next colonoscopy. She is asymptomatic       Other Visit Diagnoses    Constipation, unspecified constipation type       Relevant Orders   TSH (Completed)   Encounter for hepatitis C screening test for low risk patient       Relevant Orders   Hepatitis C antibody      Follow-up: Return 3 mn VV and 6 mn f/u.   I,David Hanna,acting as a scribe for Penni Homans, MD.,have documented all relevant documentation on the behalf of Penni Homans, MD,as directed by  Penni Homans, MD while in the presence of Penni Homans, MD.  I,  [name], MD personally performed the services described in this documentation. All medical record entries made by the scribe were at my direction and in my presence. I have reviewed the chart and agree that the record reflects my personal performance and is accurate and complete

## 2020-06-12 NOTE — Assessment & Plan Note (Addendum)
Patient encouraged to maintain heart healthy diet, regular exercise, adequate sleep. Consider daily probiotics. Take medications as prescribed. Labs reviewed. Her mammograms are completed routinely with oncology

## 2020-06-12 NOTE — Assessment & Plan Note (Signed)
She felt well on the Buspar but she got a rash red and itchy on lower arms and face. So she switched to Lexapro  But she had a lot of anti cholinergic reactions with dry mouth, constipation, decreased urination, will try switching to Fluoxetine 10 mg DAILY

## 2020-06-12 NOTE — Assessment & Plan Note (Signed)
Worsening and now year round. Using Zyxal daily and Claritin prn, singulair daily and that controls it. Is not needing Flonase. Notes some clear rhinorrhea and pruritus when it flares.

## 2020-06-12 NOTE — Patient Instructions (Signed)
Preventive Care 84-55 Years Old, Female Preventive care refers to lifestyle choices and visits with your health care provider that can promote health and wellness. This includes:  A yearly physical exam. This is also called an annual wellness visit.  Regular dental and eye exams.  Immunizations.  Screening for certain conditions.  Healthy lifestyle choices, such as: ? Eating a healthy diet. ? Getting regular exercise. ? Not using drugs or products that contain nicotine and tobacco. ? Limiting alcohol use. What can I expect for my preventive care visit? Physical exam Your health care provider will check your:  Height and weight. These may be used to calculate your BMI (body mass index). BMI is a measurement that tells if you are at a healthy weight.  Heart rate and blood pressure.  Body temperature.  Skin for abnormal spots. Counseling Your health care provider may ask you questions about your:  Past medical problems.  Family's medical history.  Alcohol, tobacco, and drug use.  Emotional well-being.  Home life and relationship well-being.  Sexual activity.  Diet, exercise, and sleep habits.  Work and work Statistician.  Access to firearms.  Method of birth control.  Menstrual cycle.  Pregnancy history. What immunizations do I need? Vaccines are usually given at various ages, according to a schedule. Your health care provider will recommend vaccines for you based on your age, medical history, and lifestyle or other factors, such as travel or where you work.   What tests do I need? Blood tests  Lipid and cholesterol levels. These may be checked every 5 years, or more often if you are over 3 years old.  Hepatitis C test.  Hepatitis B test. Screening  Lung cancer screening. You may have this screening every year starting at age 73 if you have a 30-pack-year history of smoking and currently smoke or have quit within the past 15 years.  Colorectal cancer  screening. ? All adults should have this screening starting at age 52 and continuing until age 17. ? Your health care provider may recommend screening at age 49 if you are at increased risk. ? You will have tests every 1-10 years, depending on your results and the type of screening test.  Diabetes screening. ? This is done by checking your blood sugar (glucose) after you have not eaten for a while (fasting). ? You may have this done every 1-3 years.  Mammogram. ? This may be done every 1-2 years. ? Talk with your health care provider about when you should start having regular mammograms. This may depend on whether you have a family history of breast cancer.  BRCA-related cancer screening. This may be done if you have a family history of breast, ovarian, tubal, or peritoneal cancers.  Pelvic exam and Pap test. ? This may be done every 3 years starting at age 10. ? Starting at age 11, this may be done every 5 years if you have a Pap test in combination with an HPV test. Other tests  STD (sexually transmitted disease) testing, if you are at risk.  Bone density scan. This is done to screen for osteoporosis. You may have this scan if you are at high risk for osteoporosis. Talk with your health care provider about your test results, treatment options, and if necessary, the need for more tests. Follow these instructions at home: Eating and drinking  Eat a diet that includes fresh fruits and vegetables, whole grains, lean protein, and low-fat dairy products.  Take vitamin and mineral supplements  as recommended by your health care provider.  Do not drink alcohol if: ? Your health care provider tells you not to drink. ? You are pregnant, may be pregnant, or are planning to become pregnant.  If you drink alcohol: ? Limit how much you have to 0-1 drink a day. ? Be aware of how much alcohol is in your drink. In the [place] one drink equals one 12 oz bottle of beer (355 mL), one 5 oz glass of  wine (148 mL), or one 1 oz glass of hard liquor (44 mL).   Lifestyle  Take daily care of your teeth and gums. Brush your teeth every morning and night with fluoride toothpaste. Floss one time each day.  Stay active. Exercise for at least 30 minutes 5 or more days each week.  Do not use any products that contain nicotine or tobacco, such as cigarettes, e-cigarettes, and chewing tobacco. If you need help quitting, ask your health care provider.  Do not use drugs.  If you are sexually active, practice safe sex. Use a condom or other form of protection to prevent STIs (sexually transmitted infections).  If you do not wish to become pregnant, use a form of birth control. If you plan to become pregnant, see your health care provider for a prepregnancy visit.  If told by your health care provider, take low-dose aspirin daily starting at age 50.  Find healthy ways to cope with stress, such as: ? Meditation, yoga, or listening to music. ? Journaling. ? Talking to a trusted person. ? Spending time with friends and family. Safety  Always wear your seat belt while driving or riding in a vehicle.  Do not drive: ? If you have been drinking alcohol. Do not ride with someone who has been drinking. ? When you are tired or distracted. ? While texting.  Wear a helmet and other protective equipment during sports activities.  If you have firearms in your house, make sure you follow all gun safety procedures. What's next?  Visit your health care provider once a year for an annual wellness visit.  Ask your health care provider how often you should have your eyes and teeth checked.  Stay up to date on all vaccines. This information is not intended to replace advice given to you by your health care provider. Make sure you discuss any questions you have with your health care provider. Document Revised: 10/19/2019 Document Reviewed: 09/25/2017 Elsevier Patient Education  2021 [employer]  

## 2020-06-12 NOTE — Assessment & Plan Note (Signed)
With a history of polyps she is overdue for repeat colonoscopy. She agrees to contact gastroenterology to schedule her next colonoscopy. She is asymptomatic

## 2020-06-13 ENCOUNTER — Other Ambulatory Visit (HOSPITAL_BASED_OUTPATIENT_CLINIC_OR_DEPARTMENT_OTHER): Payer: Self-pay

## 2020-06-13 MED FILL — Zolpidem Tartrate Tab 10 MG: ORAL | 15 days supply | Qty: 15 | Fill #0 | Status: AC

## 2020-06-15 LAB — HEPATITIS C ANTIBODY
Hepatitis C Ab: NONREACTIVE
SIGNAL TO CUT-OFF: 0.01 (ref <1.00)

## 2020-06-15 LAB — VITAMIN D 1,25 DIHYDROXY
Vitamin D 1, 25 (OH)2 Total: 29 pg/mL (ref 18–72)
Vitamin D2 1, 25 (OH)2: <8 pg/mL
Vitamin D3 1, 25 (OH)2: 29 pg/mL

## 2020-06-19 ENCOUNTER — Other Ambulatory Visit (HOSPITAL_BASED_OUTPATIENT_CLINIC_OR_DEPARTMENT_OTHER): Payer: Self-pay

## 2020-06-19 ENCOUNTER — Other Ambulatory Visit: Payer: Self-pay | Admitting: Medical

## 2020-06-19 DIAGNOSIS — T7840XD Allergy, unspecified, subsequent encounter: Secondary | ICD-10-CM

## 2020-06-19 MED ORDER — RALOXIFENE HCL 60 MG PO TABS
ORAL_TABLET | ORAL | 10 refills | Status: DC
  Filled 2020-06-19: qty 30, 30d supply, fill #0
  Filled 2020-07-19: qty 30, 30d supply, fill #1
  Filled 2020-08-21: qty 30, 30d supply, fill #2
  Filled 2020-09-18: qty 30, 30d supply, fill #3
  Filled 2020-10-16: qty 30, 30d supply, fill #4
  Filled 2020-11-21: qty 30, 30d supply, fill #5
  Filled 2020-12-15: qty 30, 30d supply, fill #6
  Filled 2021-01-15: qty 30, 30d supply, fill #7

## 2020-06-19 MED FILL — Montelukast Sodium Tab 10 MG (Base Equiv): ORAL | 30 days supply | Qty: 30 | Fill #0 | Status: AC

## 2020-06-20 ENCOUNTER — Other Ambulatory Visit: Payer: Self-pay | Admitting: Medical

## 2020-06-20 ENCOUNTER — Other Ambulatory Visit (HOSPITAL_BASED_OUTPATIENT_CLINIC_OR_DEPARTMENT_OTHER): Payer: Self-pay

## 2020-06-20 DIAGNOSIS — T7840XD Allergy, unspecified, subsequent encounter: Secondary | ICD-10-CM

## 2020-06-20 MED FILL — Montelukast Sodium Tab 10 MG (Base Equiv): ORAL | 30 days supply | Qty: 30 | Fill #0 | Status: CN

## 2020-06-21 ENCOUNTER — Other Ambulatory Visit (HOSPITAL_BASED_OUTPATIENT_CLINIC_OR_DEPARTMENT_OTHER): Payer: Self-pay

## 2020-07-19 MED FILL — Montelukast Sodium Tab 10 MG (Base Equiv): ORAL | 30 days supply | Qty: 30 | Fill #0 | Status: AC

## 2020-07-20 ENCOUNTER — Other Ambulatory Visit (HOSPITAL_BASED_OUTPATIENT_CLINIC_OR_DEPARTMENT_OTHER): Payer: Self-pay

## 2020-08-09 ENCOUNTER — Other Ambulatory Visit (HOSPITAL_BASED_OUTPATIENT_CLINIC_OR_DEPARTMENT_OTHER): Payer: Self-pay

## 2020-08-09 MED FILL — Zolpidem Tartrate Tab 10 MG: ORAL | 15 days supply | Qty: 15 | Fill #1 | Status: AC

## 2020-08-21 MED FILL — Montelukast Sodium Tab 10 MG (Base Equiv): ORAL | 30 days supply | Qty: 30 | Fill #1 | Status: AC

## 2020-08-22 ENCOUNTER — Other Ambulatory Visit (HOSPITAL_BASED_OUTPATIENT_CLINIC_OR_DEPARTMENT_OTHER): Payer: Self-pay

## 2020-09-04 ENCOUNTER — Encounter: Payer: Self-pay | Admitting: Family Medicine

## 2020-09-06 ENCOUNTER — Other Ambulatory Visit: Payer: Self-pay | Admitting: *Deleted

## 2020-09-06 DIAGNOSIS — Z111 Encounter for screening for respiratory tuberculosis: Secondary | ICD-10-CM

## 2020-09-11 ENCOUNTER — Other Ambulatory Visit
Admission: RE | Admit: 2020-09-11 | Discharge: 2020-09-11 | Disposition: A | Discharge status: Home / Self Care | Payer: BC Managed Care – PPO | Attending: Family Medicine | Admitting: Family Medicine

## 2020-09-11 DIAGNOSIS — Z111 Encounter for screening for respiratory tuberculosis: Secondary | ICD-10-CM | POA: Diagnosis not present

## 2020-09-17 LAB — QUANTIFERON-TB GOLD PLUS (RQFGPL)
QuantiFERON Mitogen Value: >10 IU/mL
QuantiFERON Nil Value: 0.06 IU/mL
QuantiFERON TB1 Ag Value: 0.01 IU/mL
QuantiFERON TB2 Ag Value: 0.01 IU/mL

## 2020-09-17 LAB — QUANTIFERON-TB GOLD PLUS: QuantiFERON-TB Gold Plus: NEGATIVE

## 2020-09-19 ENCOUNTER — Other Ambulatory Visit (HOSPITAL_BASED_OUTPATIENT_CLINIC_OR_DEPARTMENT_OTHER): Payer: Self-pay

## 2020-09-21 ENCOUNTER — Other Ambulatory Visit: Payer: Self-pay

## 2020-09-21 ENCOUNTER — Telehealth (INDEPENDENT_AMBULATORY_CARE_PROVIDER_SITE_OTHER): Payer: BC Managed Care – PPO | Admitting: Family Medicine

## 2020-09-21 ENCOUNTER — Other Ambulatory Visit (HOSPITAL_BASED_OUTPATIENT_CLINIC_OR_DEPARTMENT_OTHER): Payer: Self-pay

## 2020-09-21 DIAGNOSIS — F419 Anxiety disorder, unspecified: Secondary | ICD-10-CM | POA: Diagnosis not present

## 2020-09-21 MED ORDER — SERTRALINE HCL 50 MG PO TABS
ORAL_TABLET | ORAL | 2 refills | Status: DC
  Filled 2020-09-21: qty 30, 30d supply, fill #0
  Filled 2020-10-27: qty 30, 30d supply, fill #1
  Filled 2020-11-28: qty 30, 30d supply, fill #2

## 2020-09-21 NOTE — Progress Notes (Signed)
MyChart Video Visit    Virtual Visit via Video Note   This visit type was conducted due to national recommendations for restrictions regarding the COVID-19 Pandemic (e.g. social distancing) in an effort to limit this patient's exposure and mitigate transmission in our community. This patient is at least at moderate risk for complications without adequate follow up. This format is felt to be most appropriate for this patient at this time. Physical exam was limited by quality of the video and audio technology used for the visit. S Chism, CMA was able to get the patient set up on a video visit.  Patient location: home Patient and provider in visit Provider location: Office  I discussed the limitations of evaluation and management by telemedicine and the availability of in person appointments. The patient expressed understanding and agreed to proceed.  Visit Date: 09/21/2020  Today's healthcare provider: Penni Homans, MD     Subjective:    Patient ID: Abigail [name], female    DOB: Aug 26, 1965, 55 y.o.   MRN: [id number]  Chief Complaint  Patient presents with   3 month follow up    Started Fluoxetine 10 mg at last OV for Anxiety.  Concerns/ questions: pt says she stopped the Prozac and restarted the Lexapro 1 week ago. She says she felt like the Prozac was not helping much.     HPI  Patient is in today for video visit for follow up on anxiety medication management and anxiety. When she switched from Lexapro 10 mg to Fluoxetine 10 mg. She did not do well and experienced muscle tension and increased anxiety. Therefore went back to taking her Lexapro 10 mg. She states that once she restarted Lexapro she started experiencing dry mouth again. She liked Buspar because it lessened her anxiety and made her feel like herself, but could not continue due to having rash breakouts while on it. She expresses interest in finding another medication that does not come with side effects of weight  gain, decreased concentration, or deal with muscle tension. However, she can tolerate dry mouth. She denies CP/palp/SOB/HA/congestion/fevers/GI or GU c/o. Taking meds as prescribed.   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   Chicken pox as a child   History of mononucleosis    History of streptococcal pharyngitis    Hot flashes    Insomnia 02/11/2011   Post-operative nausea and vomiting    past c-section   Preventative health care 12/04/2010   S/P chemotherapy, time since less than 4 weeks 05/23/2011   completed 4 doses docetaxel/cyclophosphamide   Urticaria    UTI (lower urinary tract infection) 11/28/2011   Vaginitis 10/08/2011    Past Surgical History:  Procedure Laterality Date   BREAST RECONSTRUCTION Bilateral 03/24/2012   Procedure: BREAST RECONSTRUCTION WITH PLACEMENT OF SILICONE GEL IMPLANTS;  Surgeon: Crissie Reese, MD;  Location: [facility];  Service: Plastics;  Laterality: Bilateral;   CESAREAN SECTION  1999   X 1   MASTECTOMY W/ SENTINEL NODE BIOPSY  08/07/2011   Procedure: MASTECTOMY WITH SENTINEL LYMPH NODE BIOPSY;  Surgeon: Merrie Roof, MD;  Location[facility]  OR;  Service: General;  Laterality: Bilateral;  bilateral mastectomy   MOUTH SURGERY  1997   gum surgery   PORT-A-CATH REMOVAL  08/07/2011   Procedure: REMOVAL PORT-A-CATH;  Surgeon: Merrie Roof, MD;  Location: [facility];  Service: General;  Laterality: Right;   PORTACATH PLACEMENT  03/20/2011   Procedure: INSERTION PORT-A-CATH;  Surgeon: Merrie Roof, MD;  Location: [facility];  Service: General;  Laterality: Right;  placement of port -Right  Subclavian Vein   TISSUE EXPANDER PLACEMENT  08/07/2011   Procedure: TISSUE EXPANDER;  Surgeon: Crissie Reese, MD;  Location: [facility];  Service: Plastics;  Laterality: Bilateral;  BILATERAL PLACEMENT OF TISSUE EXPANDERS FOR BREAST RECONSTRUCTION   TUBAL LIGATION     1999   [place] [facility]    Family History  Adopted: Yes  Problem Relation Age of Onset   Heart disease Mother    Heart disease Maternal Uncle    Other Maternal Grandmother        mad cow disease   Cancer Other        prostate cancer, diffuse in uncles, GF    Social History   Socioeconomic History   Marital status: Married    Spouse name: Not on file   Number of children: 2   Years of education: Not on file   Highest education level: Not on file  Occupational History   Occupation: [place]    Employer: [employer]  Tobacco Use   Smoking status: Never   Smokeless tobacco: Never  Substance and Sexual Activity   Alcohol use: Yes    Comment: occasionally- social  2 drinks   Drug use: No   Sexual activity: Yes    Birth control/protection: Surgical  Other Topics Concern   Not on file  Social History Narrative   Not on file   Social Determinants of Health   Financial Resource Strain: Not on file  Food Insecurity: Not on file  Transportation Needs: Not on file  Physical Activity: Not on file  Stress: Not on file  Social Connections: Not on file  Intimate Partner Violence: Not on file    Outpatient Medications Prior to Visit  Medication Sig Dispense Refill   Calcium Carb-Cholecalciferol (CALCIUM + VITAMIN D3 PO) Take by mouth.     levocetirizine (XYZAL) 5 MG tablet TAKE 1 TABLET BY MOUTH EVERY EVENING 30 tablet 11   loratadine (CLARITIN) 10 MG tablet Take 10 mg by mouth 2 (two) times daily.      montelukast (SINGULAIR) 10 MG tablet Take 1 tablet (10 mg total) by mouth at bedtime as needed. 30 tablet 2   raloxifene (EVISTA) 60 MG tablet TAKE 1 TABLET BY MOUTH ONCE  DAILY 30 tablet 1   raloxifene (EVISTA) 60 MG tablet take 1 tablet by mouth daily 30 tablet 10   Zoster Vaccine Adjuvanted [facility]) injection Inject into the muscle. 1 each 1   zolpidem (AMBIEN) 10 MG tablet TAKE 1 TABLET BY MOUTH EVERY NIGHT AT BEDTIME AS NEEDED 15 tablet 3   FLUoxetine (PROZAC) 10 MG tablet Take 1 tablet (10 mg total) by mouth daily. (Patient not taking: Reported on 09/21/2020) 90 tablet 1   No facility-administered medications prior to visit.    Allergies  Allergen Reactions   Penicillins Rash    Review of Systems  Constitutional:  Negative for chills, fever and malaise/fatigue.  HENT:  Negative for congestion, sinus pain and sore  throat.   Eyes:  Negative for blurred vision.  Respiratory:  Negative for cough and shortness of breath.   Cardiovascular:  Negative for chest pain, palpitations and leg swelling.  Gastrointestinal:  Negative for blood in stool, diarrhea, nausea and vomiting.  Genitourinary:  Negative for flank pain and frequency.  Musculoskeletal:  Negative for back pain.  Skin:  Negative for rash.  Neurological:  Negative for headaches.  Endo/Heme/Allergies:  Positive for environmental allergies.  Psychiatric/Behavioral:  The patient is nervous/anxious.       Objective:    Physical Exam Constitutional:      Appearance: Normal appearance.  HENT:     Head: Normocephalic and atraumatic.     Right Ear: External ear normal.     Left Ear: External ear normal.  Pulmonary:     Effort: Pulmonary effort is normal.  Musculoskeletal:        General: Normal range of motion.     Cervical back: Normal range of motion.  Skin:    General: Skin is dry.  Neurological:     Mental Status: She is alert and oriented to person, place, and time.  Psychiatric:        Behavior: Behavior normal.    LMP 03/02/2011  Wt Readings from Last 3 Encounters:  06/12/20 142 lb (64.4 kg)  11/25/19 140 lb 3.2 oz (63.6 kg)  06/09/19 140 lb (63.5 kg)    Diabetic Foot  Exam - Simple   No data filed    Lab Results  Component Value Date   WBC 3.6 (L) 06/12/2020   HGB 12.6 06/12/2020   HCT 37.4 06/12/2020   PLT 212.0 06/12/2020   GLUCOSE 85 06/12/2020   CHOL 166 06/12/2020   TRIG 75.0 06/12/2020   HDL 72.20 06/12/2020   LDLCALC 79 06/12/2020   ALT 24 06/12/2020   AST 22 06/12/2020   NA 143 06/12/2020   K 4.3 06/12/2020   CL 104 06/12/2020   CREATININE 0.83 06/12/2020   BUN 19 06/12/2020   CO2 32 06/12/2020   TSH 1.74 06/12/2020   HGBA1C 5.7 12/01/2017    Lab Results  Component Value Date   TSH 1.74 06/12/2020   Lab Results  Component Value Date   WBC 3.6 (L) 06/12/2020   HGB 12.6 06/12/2020   HCT 37.4 06/12/2020   MCV 94.7 06/12/2020   PLT 212.0 06/12/2020   Lab Results  Component Value Date   NA 143 06/12/2020   K 4.3 06/12/2020   CHLORIDE 106 04/08/2016   CO2 32 06/12/2020   GLUCOSE 85 06/12/2020   BUN 19 06/12/2020   CREATININE 0.83 06/12/2020   BILITOT 0.6 06/12/2020   ALKPHOS 48 06/12/2020   AST 22 06/12/2020   ALT 24 06/12/2020   PROT 6.7 06/12/2020   ALBUMIN 4.5 06/12/2020   CALCIUM 9.8 06/12/2020   ANIONGAP 7 04/08/2016   EGFR 79 (L) 04/08/2016   GFR 79.92 06/12/2020   Lab Results  Component Value Date   CHOL 166 06/12/2020   Lab Results  Component Value Date   HDL 72.20 06/12/2020   Lab Results  Component Value Date   LDLCALC 79 06/12/2020   Lab Results  Component Value Date   TRIG 75.0 06/12/2020   Lab Results  Component Value Date   CHOLHDL 2 06/12/2020   Lab Results  Component Value Date   HGBA1C 5.7 12/01/2017       Assessment & Plan:   Problem List Items Addressed This Visit   None  Meds ordered this encounter  Medications   sertraline (ZOLOFT) 50 MG tablet    Sig: 1/2 tab po daily x 7 days then 1 tab po daily    Dispense:  30 tablet    Refill:  2     I discussed the assessment and treatment plan with the patient. The patient was provided an opportunity to ask questions  and all were answered. The patient agreed with the plan and demonstrated an understanding of the instructions.   The patient was advised to call back or seek an in-person evaluation if the symptoms worsen or if the condition fails to improve as anticipated.  I provided 10 minutes of face-to-face time during this encounter.   Penni Homans, MD [facility] [phone] (phone) [phone] (fax)  [place], Suezanne Jacquet, acting as a scribe for Penni Homans, MD, have documented all relevent documentation on behalf of Penni Homans, MD, as directed by Penni Homans, MD while in the presence of Penni Homans, MD.  I, [name], MD personally performed the services described in this documentation. All medical record entries made by the scribe were at my direction and in my presence. I have reviewed the chart and agree that the record reflects my personal performance and is accurate and complete

## 2020-09-22 NOTE — Assessment & Plan Note (Addendum)
We changed from Lexapro due to dry mouth and constipation to Prozac but this did not help her anxiety and she devleoped too much muscle tension so she switched back to Lexapro. Will try Sertraline 25 mg daily x 7 days then increase to 50 mg daily. Notify us if not tolerated so we can make further adjustments.

## 2020-10-16 ENCOUNTER — Other Ambulatory Visit: Payer: Self-pay

## 2020-10-17 ENCOUNTER — Other Ambulatory Visit (HOSPITAL_BASED_OUTPATIENT_CLINIC_OR_DEPARTMENT_OTHER): Payer: Self-pay

## 2020-10-18 ENCOUNTER — Other Ambulatory Visit (HOSPITAL_BASED_OUTPATIENT_CLINIC_OR_DEPARTMENT_OTHER): Payer: Self-pay

## 2020-10-18 MED ORDER — ZOLPIDEM TARTRATE 10 MG PO TABS
10.0000 mg | ORAL_TABLET | Freq: Every evening | ORAL | 2 refills | Status: DC | PRN
  Filled 2020-10-18: qty 15, 15d supply, fill #0

## 2020-10-20 ENCOUNTER — Other Ambulatory Visit (HOSPITAL_BASED_OUTPATIENT_CLINIC_OR_DEPARTMENT_OTHER): Payer: Self-pay

## 2020-10-20 MED ORDER — ZOLPIDEM TARTRATE 10 MG PO TABS
10.0000 mg | ORAL_TABLET | Freq: Every evening | ORAL | 3 refills | Status: DC | PRN
  Filled 2020-10-20: qty 15, 15d supply, fill #0

## 2020-10-27 ENCOUNTER — Other Ambulatory Visit (HOSPITAL_BASED_OUTPATIENT_CLINIC_OR_DEPARTMENT_OTHER): Payer: Self-pay

## 2020-11-03 ENCOUNTER — Other Ambulatory Visit (HOSPITAL_BASED_OUTPATIENT_CLINIC_OR_DEPARTMENT_OTHER): Payer: Self-pay

## 2020-11-03 MED ORDER — SHINGRIX 50 MCG/0.5ML IM SUSR
INTRAMUSCULAR | 0 refills | Status: DC
  Filled 2020-11-03: qty 1, 1d supply, fill #0

## 2020-11-03 MED ORDER — INFLUENZA VAC SPLIT QUAD 0.5 ML IM SUSY
PREFILLED_SYRINGE | INTRAMUSCULAR | 0 refills | Status: DC
  Filled 2020-11-03: qty 0.5, 1d supply, fill #0

## 2020-11-06 MED FILL — Montelukast Sodium Tab 10 MG (Base Equiv): ORAL | 30 days supply | Qty: 30 | Fill #2 | Status: AC

## 2020-11-07 ENCOUNTER — Other Ambulatory Visit (HOSPITAL_BASED_OUTPATIENT_CLINIC_OR_DEPARTMENT_OTHER): Payer: Self-pay

## 2020-11-21 ENCOUNTER — Other Ambulatory Visit (HOSPITAL_BASED_OUTPATIENT_CLINIC_OR_DEPARTMENT_OTHER): Payer: Self-pay

## 2020-11-29 ENCOUNTER — Other Ambulatory Visit (HOSPITAL_BASED_OUTPATIENT_CLINIC_OR_DEPARTMENT_OTHER): Payer: Self-pay

## 2020-12-14 ENCOUNTER — Other Ambulatory Visit: Payer: Self-pay | Admitting: Medical

## 2020-12-14 ENCOUNTER — Ambulatory Visit: Payer: BC Managed Care – PPO | Admitting: Family Medicine

## 2020-12-14 ENCOUNTER — Other Ambulatory Visit (HOSPITAL_BASED_OUTPATIENT_CLINIC_OR_DEPARTMENT_OTHER): Payer: Self-pay

## 2020-12-14 ENCOUNTER — Other Ambulatory Visit: Payer: Self-pay

## 2020-12-14 DIAGNOSIS — J309 Allergic rhinitis, unspecified: Secondary | ICD-10-CM

## 2020-12-14 DIAGNOSIS — M810 Age-related osteoporosis without current pathological fracture: Secondary | ICD-10-CM

## 2020-12-14 DIAGNOSIS — T7840XD Allergy, unspecified, subsequent encounter: Secondary | ICD-10-CM | POA: Diagnosis not present

## 2020-12-14 DIAGNOSIS — F419 Anxiety disorder, unspecified: Secondary | ICD-10-CM | POA: Diagnosis not present

## 2020-12-14 MED ORDER — ZOLPIDEM TARTRATE 10 MG PO TABS
10.0000 mg | ORAL_TABLET | Freq: Every evening | ORAL | 3 refills | Status: DC | PRN
  Filled 2020-12-14: qty 15, 15d supply, fill #0
  Filled 2021-03-02: qty 15, 15d supply, fill #1
  Filled 2021-04-30: qty 15, 15d supply, fill #2

## 2020-12-14 MED ORDER — SERTRALINE HCL 50 MG PO TABS
50.0000 mg | ORAL_TABLET | Freq: Every day | ORAL | 1 refills | Status: DC
  Filled 2020-12-14 – 2020-12-25 (×2): qty 90, 90d supply, fill #0
  Filled 2021-05-12: qty 90, 90d supply, fill #1

## 2020-12-14 MED ORDER — MONTELUKAST SODIUM 10 MG PO TABS
10.0000 mg | ORAL_TABLET | Freq: Every evening | ORAL | 1 refills | Status: DC | PRN
  Filled 2020-12-14: qty 90, 90d supply, fill #0
  Filled 2021-03-22: qty 90, 90d supply, fill #1

## 2020-12-14 NOTE — Assessment & Plan Note (Addendum)
Heart rate improved and anxiety manageable on 50 mg of Sertraline at dinner time. prozac ineffective, buspar pruritus, Lexapro constipation and dry mouth. Refill given on Sertraline today

## 2020-12-14 NOTE — Assessment & Plan Note (Signed)
Encouraged to get adequate exercise, calcium and vitamin d intake 

## 2020-12-14 NOTE — Progress Notes (Signed)
Patient ID: [name], female    DOB: 30-Jun-1965  Age: 55 y.o. MRN: [id number]    Subjective:   No chief complaint on file.  Subjective   HPI Abigail [name] presents for office visit today for follow up on anxiety and medication management. She is doing ok and has no recent febrile illnesses or ER visits to report. Denies CP/palp/SOB/HA/congestion/fevers/GI or GU c/o. Taking meds as prescribed.  Anxiety: She states that tachycardia bothers her the most when experiencing anxiety. She has tried Buspar which worked but made her itchy. She then tried Lexapro which worked well, but caused her dry mouth and constipation. She also tried Fluoxetine but it did not help. Finally, she is now sertraline which seems to work when she takes it at dinner time. However, she sometimes experiences vivid dreams and lethargy.  Vaccination/immunization: She received her shingles, flu, and original 2 covid shots.   Review of Systems  Constitutional:  Negative for chills, fatigue and fever.  HENT:  Negative for congestion, rhinorrhea, sinus pressure, sinus pain and sore throat.   Eyes:  Negative for pain.  Respiratory:  Negative for cough and shortness of breath.   Cardiovascular:  Negative for chest pain, palpitations and leg swelling.  Gastrointestinal:  Negative for abdominal pain, blood in stool, diarrhea, nausea and vomiting.  Genitourinary:  Negative for decreased urine volume, flank pain, frequency, vaginal bleeding and vaginal discharge.  Musculoskeletal:  Negative for back pain.  Neurological:  Negative for headaches.  Psychiatric/Behavioral:  The patient is nervous/anxious.    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   Chicken pox as a child   History of mononucleosis    History of streptococcal pharyngitis    Hot flashes    Insomnia 02/11/2011   Post-operative nausea and vomiting    past c-section   Preventative health care 12/04/2010   S/P chemotherapy, time since less than 4 weeks 05/23/2011   completed 4 doses docetaxel/cyclophosphamide   Urticaria    UTI (lower urinary tract infection) 11/28/2011   Vaginitis 10/08/2011    She has a past surgical history that includes Mouth surgery (1997); Cesarean section (1999); Varicose vein surgery; Portacath placement (03/20/2011); Tubal ligation; Port-a-cath removal (08/07/2011); Mastectomy w/ sentinel node biopsy (08/07/2011); Tissue expander placement (08/07/2011); and Breast reconstruction (Bilateral, 03/24/2012).   Her family history includes Cancer in an other family member; Heart disease in her maternal uncle and mother; Other in her maternal grandmother. She was adopted.She reports that she has never smoked. She has never used smokeless tobacco. She reports current alcohol use. She reports that she does not use drugs.  Current Outpatient Medications on File Prior to Visit  Medication Sig Dispense Refill   Calcium Carb-Cholecalciferol (CALCIUM + VITAMIN D3 PO) Take by mouth.     influenza vac split quadrivalent PF (FLUARIX) 0.5 ML injection Inject into the muscle. 0.5 mL 0   levocetirizine (XYZAL) 5 MG tablet TAKE 1 TABLET BY MOUTH EVERY EVENING 30 tablet 11   loratadine (CLARITIN) 10 MG tablet Take 10 mg by mouth 2 (two) times daily.      raloxifene (  EVISTA) 60 MG tablet take 1 tablet by mouth daily 30 tablet 10   Zoster Vaccine Adjuvanted [facility]) injection Inject into the muscle. 1 each 0   No current facility-administered medications on file prior to visit.     Objective:  Objective  Physical Exam Constitutional:      General: She is not in acute  distress.    Appearance: Normal appearance. She is not ill-appearing or toxic-appearing.  HENT:     Head: Normocephalic and atraumatic.     Right Ear: Tympanic membrane, ear canal and external ear normal.     Left Ear: Tympanic membrane, ear canal and external ear normal.     Nose: No congestion or rhinorrhea.  Eyes:     Extraocular Movements: Extraocular movements intact.     Pupils: Pupils are equal, round, and reactive to light.  Cardiovascular:     Rate and Rhythm: Normal rate and regular rhythm.     Pulses: Normal pulses.     Heart sounds: Normal heart sounds. No murmur heard. Pulmonary:     Effort: Pulmonary effort is normal. No respiratory distress.     Breath sounds: Normal breath sounds. No wheezing, rhonchi or rales.  Abdominal:     General: Bowel sounds are normal.     Palpations: Abdomen is soft. There is no mass.     Tenderness: There is no abdominal tenderness. There is no guarding.     Hernia: No hernia is present.  Musculoskeletal:        General: Normal range of motion.     Cervical back: Normal range of motion and neck supple.  Skin:    General: Skin is warm and dry.  Neurological:     Mental Status: She is alert and oriented to person, place, and time.  Psychiatric:        Behavior: Behavior normal.   BP 100/64   Pulse 86   Temp 98.3 F (36.8 C)   Resp 16   Wt 144 lb 6.4 oz (65.5 kg)   LMP 03/02/2011   SpO2 98%   BMI 23.31 kg/m  Wt Readings from Last 3 Encounters:  12/14/20 144 lb 6.4 oz (65.5 kg)  06/12/20 142 lb (64.4 kg)  11/25/19 140 lb 3.2 oz (63.6 kg)     Lab Results  Component Value Date   WBC 3.6 (L) 06/12/2020   HGB 12.6 06/12/2020   HCT 37.4 06/12/2020   PLT 212.0 06/12/2020   GLUCOSE 85 06/12/2020   CHOL 166 06/12/2020   TRIG 75.0 06/12/2020   HDL 72.20 06/12/2020   LDLCALC 79 06/12/2020   ALT 24 06/12/2020   AST 22 06/12/2020   NA 143 06/12/2020   K 4.3 06/12/2020   CL 104 06/12/2020   CREATININE 0.83 06/12/2020   BUN 19  06/12/2020   CO2 32 06/12/2020   TSH 1.74 06/12/2020   HGBA1C 5.7 12/01/2017    No results found.   Assessment & Plan:  Plan    Meds ordered this encounter  Medications   sertraline (ZOLOFT) 50 MG tablet    Sig: Take one-half tablet by mouth daily x 7 days, then take 1 tablet daily thereafter    Dispense:  90 tablet    Refill:  1   montelukast (SINGULAIR) 10 MG tablet    Sig: Take 1 tablet (10 mg total) by mouth at bedtime as needed.    Dispense:  90 tablet    Refill:  1   zolpidem (AMBIEN) 10 MG tablet  Sig: TAKE 1 TABLET BY MOUTH EVERY NIGHT AT BEDTIME AS NEEDED    Dispense:  15 tablet    Refill:  3     Problem List Items Addressed This Visit     Allergy   Relevant Medications   montelukast (SINGULAIR) 10 MG tablet   Anxiety    Heart rate improved and anxiety manageable on 50 mg of Sertraline at dinner time. prozac ineffective, buspar pruritus, Lexapro constipation and dry mouth. Refill given on Sertraline today      Relevant Medications   sertraline (ZOLOFT) 50 MG tablet   Osteoporosis    Encouraged to get adequate exercise, calcium and vitamin d intake      Other Visit Diagnoses     Allergic rhinitis, unspecified seasonality, unspecified trigger           Follow-up: Return in about 6 months (around 06/13/2021) for cpe.  I, Suezanne Jacquet, acting as a scribe for Penni Homans, MD, have documented all relevent documentation on behalf of Penni Homans, MD, as directed by Penni Homans, MD while in the presence of Penni Homans, MD. DO:12/14/20.  I, [name], MD personally performed the services described in this documentation. All medical record entries made by the scribe were at my direction and in my presence. I have reviewed the chart and agree that the record reflects my personal performance and is accurate and complete

## 2020-12-15 ENCOUNTER — Other Ambulatory Visit (HOSPITAL_BASED_OUTPATIENT_CLINIC_OR_DEPARTMENT_OTHER): Payer: Self-pay

## 2020-12-26 ENCOUNTER — Other Ambulatory Visit (HOSPITAL_BASED_OUTPATIENT_CLINIC_OR_DEPARTMENT_OTHER): Payer: Self-pay

## 2021-01-15 ENCOUNTER — Other Ambulatory Visit (HOSPITAL_BASED_OUTPATIENT_CLINIC_OR_DEPARTMENT_OTHER): Payer: Self-pay

## 2021-01-23 ENCOUNTER — Other Ambulatory Visit (HOSPITAL_BASED_OUTPATIENT_CLINIC_OR_DEPARTMENT_OTHER): Payer: Self-pay

## 2021-02-15 ENCOUNTER — Other Ambulatory Visit (HOSPITAL_BASED_OUTPATIENT_CLINIC_OR_DEPARTMENT_OTHER): Payer: Self-pay

## 2021-02-16 ENCOUNTER — Other Ambulatory Visit (HOSPITAL_BASED_OUTPATIENT_CLINIC_OR_DEPARTMENT_OTHER): Payer: Self-pay

## 2021-02-16 MED ORDER — RALOXIFENE HCL 60 MG PO TABS
60.0000 mg | ORAL_TABLET | Freq: Every day | ORAL | 0 refills | Status: DC
  Filled 2021-02-16: qty 30, 30d supply, fill #0

## 2021-02-28 ENCOUNTER — Other Ambulatory Visit (HOSPITAL_BASED_OUTPATIENT_CLINIC_OR_DEPARTMENT_OTHER): Payer: Self-pay

## 2021-02-28 MED ORDER — RALOXIFENE HCL 60 MG PO TABS
60.0000 mg | ORAL_TABLET | Freq: Every day | ORAL | 4 refills | Status: AC
  Filled 2021-03-22: qty 90, 90d supply, fill #0
  Filled 2021-06-20: qty 90, 90d supply, fill #1

## 2021-02-28 MED ORDER — ESTRADIOL 0.1 MG/GM VA CREA
TOPICAL_CREAM | VAGINAL | 4 refills | Status: AC
  Filled 2021-02-28: qty 42.5, 90d supply, fill #0
  Filled 2021-07-02: qty 42.5, 90d supply, fill #1

## 2021-03-01 ENCOUNTER — Other Ambulatory Visit (HOSPITAL_BASED_OUTPATIENT_CLINIC_OR_DEPARTMENT_OTHER): Payer: Self-pay

## 2021-03-02 ENCOUNTER — Other Ambulatory Visit (HOSPITAL_BASED_OUTPATIENT_CLINIC_OR_DEPARTMENT_OTHER): Payer: Self-pay

## 2021-03-02 ENCOUNTER — Encounter: Payer: Self-pay | Admitting: Internal Medicine

## 2021-03-22 ENCOUNTER — Other Ambulatory Visit (HOSPITAL_BASED_OUTPATIENT_CLINIC_OR_DEPARTMENT_OTHER): Payer: Self-pay

## 2021-04-02 ENCOUNTER — Other Ambulatory Visit (HOSPITAL_BASED_OUTPATIENT_CLINIC_OR_DEPARTMENT_OTHER): Payer: Self-pay

## 2021-04-05 ENCOUNTER — Telehealth: Payer: Self-pay | Admitting: *Deleted

## 2021-04-05 ENCOUNTER — Other Ambulatory Visit (HOSPITAL_BASED_OUTPATIENT_CLINIC_OR_DEPARTMENT_OTHER): Payer: Self-pay

## 2021-04-05 ENCOUNTER — Ambulatory Visit (AMBULATORY_SURGERY_CENTER): Payer: BC Managed Care – PPO | Admitting: *Deleted

## 2021-04-05 ENCOUNTER — Other Ambulatory Visit: Payer: Self-pay

## 2021-04-05 VITALS — Ht 66.0 in | Wt 140.0 lb

## 2021-04-05 DIAGNOSIS — Z86010 Personal history of colonic polyps: Secondary | ICD-10-CM

## 2021-04-05 MED ORDER — CLENPIQ 10-3.5-12 MG-GM -GM/160ML PO SOLN
1.0000 | ORAL | 0 refills | Status: DC
  Filled 2021-04-05: qty 320, 1d supply, fill #0

## 2021-04-05 NOTE — Telephone Encounter (Signed)
Patient no showed PV today- at 1030 am  Called patient  at 1021 am, 1030 am ,1036 am and 1040 am and left message on cell and hone numbers each call  ? ?at 1040 am LM  to return call by 5 pm today- If no call by 5 pm, PV and procedure will be canceled - no call at 5 pm - ? ? PV and Procedure both canceled- No Show letter mailed to patient   ?

## 2021-04-05 NOTE — Telephone Encounter (Signed)
Pt called back and RS to 230 pm today  ?

## 2021-04-05 NOTE — Progress Notes (Signed)
No egg or soy allergy known to patient  ?No issues known to pt with past sedation with any surgeries or procedures ?Patient denies ever being told they had issues or difficulty with intubation  ?No FH of Malignant Hyperthermia ?Pt is not on diet pills ?Pt is not on  home 02  ?Pt is not on blood thinners  ?Pt denies issues with constipation  ?No A fib or A flutter ? ?Pt is fully vaccinated  for Covid  ? ?Clenpiq  Coupon to pt in PV today , Code to Pharmacy and  NO PA's for preps discussed with pt In PV today  ?Discussed with pt there will be an out-of-pocket cost for prep and that varies from $0 to 70 +  dollars - pt verbalized understanding  ? ?Due to the COVID-19 pandemic we are asking patients to follow certain guidelines in PV and the [place]   ?Pt aware of COVID protocols and [facility] guidelines  ? ?PV completed over the phone. Pt verified name, DOB, address and insurance during PV today.  ?Pt mailed instruction packet with copy of consent form to read and not return, and instructions.  ?Pt encouraged to call with questions or issues.  ?If pt has My chart, procedure instructions sent via My Chart  ? ?

## 2021-04-05 NOTE — Telephone Encounter (Signed)
Attempted pt again this pm at 220 pm. 230 pm,  ?233 pm, 240 pm, and 248 pm- the phone [phone] rings once and goes strainght to voicemail- the VM identifies pt by first and last name- I have left message every time I have attempted pt today to try to complete her PV  ?

## 2021-04-10 ENCOUNTER — Encounter: Payer: Self-pay | Admitting: Internal Medicine

## 2021-04-19 ENCOUNTER — Ambulatory Visit (AMBULATORY_SURGERY_CENTER): Payer: BC Managed Care – PPO | Admitting: Internal Medicine

## 2021-04-19 ENCOUNTER — Other Ambulatory Visit: Payer: Self-pay

## 2021-04-19 ENCOUNTER — Encounter: Payer: Self-pay | Admitting: Internal Medicine

## 2021-04-19 VITALS — BP 133/64 | HR 66 | Temp 97.5°F | Resp 8 | Ht 66.0 in | Wt 140.0 lb

## 2021-04-19 DIAGNOSIS — Z86010 Personal history of colonic polyps: Secondary | ICD-10-CM | POA: Diagnosis not present

## 2021-04-19 DIAGNOSIS — K635 Polyp of colon: Secondary | ICD-10-CM

## 2021-04-19 DIAGNOSIS — D123 Benign neoplasm of transverse colon: Secondary | ICD-10-CM

## 2021-04-19 MED ORDER — SODIUM CHLORIDE 0.9 % IV SOLN: 500.0000 mL | Freq: Once | INTRAVENOUS | Status: DC

## 2021-04-19 NOTE — Progress Notes (Signed)
Called to room to assist during endoscopic procedure.  Patient ID and intended procedure confirmed with present staff. Received instructions for my participation in the procedure from the performing physician.  

## 2021-04-19 NOTE — Progress Notes (Signed)
To Pacu, VSS. Report to [email or web address] 

## 2021-04-19 NOTE — Op Note (Signed)
[facility] ?Patient Name: Abigail [name] ?Procedure Date: 04/19/2021 1:22 PM ?MRN: [id number] ?Endoscopist: Jerene Bears , MD ?Age: 56 ?Referring MD:  ?Date of Birth: 1965/12/29 ?Gender: Female ?Account #: [id number] ?Procedure:                Colonoscopy ?Indications:              High risk colon cancer surveillance: Personal  ?                          history of sessile serrated colon polyps including  ?                          1 (10 mm or greater in size), Last colonoscopy:  ?                          December 2017 ?Medicines:                Monitored Anesthesia Care ?Procedure:                Pre-Anesthesia Assessment: ?                          - Prior to the procedure, a History and Physical  ?                          was performed, and patient medications and  ?                          allergies were reviewed. The patient's tolerance of  ?                          previous anesthesia was also reviewed. The risks  ?                          and benefits of the procedure and the sedation  ?                          options and risks were discussed with the patient.  ?                          All questions were answered, and informed consent  ?                          was obtained. Prior Anticoagulants: The patient has  ?                          taken no previous anticoagulant or antiplatelet  ?                          agents. ASA Grade Assessment: II - A patient with  ?                          mild systemic disease. After reviewing the risks  ?  and benefits, the patient was deemed in  ?                          satisfactory condition to undergo the procedure. ?                          After obtaining informed consent, the colonoscope  ?                          was passed under direct vision. Throughout the  ?                          procedure, the patient's blood pressure, pulse, and  ?                          oxygen saturations were monitored continuously. The   ?                          Olympus PCF-H190DL ([id number]) Colonoscope was  ?                          introduced through the anus and advanced to the  ?                          cecum, identified by appendiceal orifice and  ?                          ileocecal valve. The colonoscopy was technically  ?                          difficult and complex due to a tortuous colon. The  ?                          patient tolerated the procedure well. The quality  ?                          of the bowel preparation was good. The ileocecal  ?                          valve, appendiceal orifice, and rectum were  ?                          photographed. ?Scope In: 1:35:22 PM ?Scope Out: 1:59:07 PM ?Scope Withdrawal Time: 0 hours 11 minutes 49 seconds  ?Total Procedure Duration: 0 hours 23 minutes 45 seconds  ?Findings:                 The digital rectal exam was normal. ?                          A 6 mm polyp was found in the transverse colon. The  ?                          polyp was sessile. The polyp was removed with a  ?  cold snare. Resection and retrieval were complete. ?                          Internal hemorrhoids were found during  ?                          retroflexion. The hemorrhoids were small. ?                          The exam was otherwise without abnormality. ?Complications:            No immediate complications. ?Estimated Blood Loss:     Estimated blood loss was minimal. ?Impression:               - One 6 mm polyp in the transverse colon, removed  ?                          with a cold snare. Resected and retrieved. ?                          - Small internal hemorrhoids. ?                          - The examination was otherwise normal. ?Recommendation:           - Patient has a contact number available for  ?                          emergencies. The signs and symptoms of potential  ?                          delayed complications were discussed with the  ?                           patient. Return to normal activities tomorrow.  ?                          Written discharge instructions were provided to the  ?                          patient. ?                          - Resume previous diet. ?                          - Continue present medications. ?                          - Await pathology results. ?                          - Repeat colonoscopy is recommended for  ?                          surveillance. The colonoscopy date will be  ?  determined after pathology results from today's  ?                          exam become available for review. ?Jerene Bears, MD ?04/19/2021 2:12:18 PM ?This report has been signed electronically. ?

## 2021-04-19 NOTE — Patient Instructions (Signed)
YOU HAD AN ENDOSCOPIC PROCEDURE TODAY AT THE [facility]:   Refer to the procedure report that was given to you for any specific questions about what was found during the examination.  If the procedure report does not answer your questions, please call your gastroenterologist to clarify.  If you requested that your care partner not be given the details of your procedure findings, then the procedure report has been included in a sealed envelope for you to review at your convenience later.  YOU SHOULD EXPECT: Some feelings of bloating in the abdomen. Passage of more gas than usual.  Walking can help get rid of the air that was put into your GI tract during the procedure and reduce the bloating. If you had a lower endoscopy (such as a colonoscopy or flexible sigmoidoscopy) you may notice spotting of blood in your stool or on the toilet paper. If you underwent a bowel prep for your procedure, you may not have a normal bowel movement for a few days.  Please Note:  You might notice some irritation and congestion in your nose or some drainage.  This is from the oxygen used during your procedure.  There is no need for concern and it should clear up in a day or so.  SYMPTOMS TO REPORT IMMEDIATELY:   Following lower endoscopy (colonoscopy or flexible sigmoidoscopy):  Excessive amounts of blood in the stool  Significant tenderness or worsening of abdominal pains  Swelling of the abdomen that is new, acute  Fever of 100F or higher  For urgent or emergent issues, a gastroenterologist can be reached at any hour by calling [phone]. Do not use MyChart messaging for urgent concerns.    DIET:  We do recommend a small meal at first, but then you may proceed to your regular diet.  Drink plenty of fluids but you should avoid alcoholic beverages for 24 hours.  ACTIVITY:  You should plan to take it easy for the rest of today and you should NOT DRIVE or use heavy machinery until tomorrow (because  of the sedation medicines used during the test).    FOLLOW UP: Our staff will call the number listed on your records 48-72 hours following your procedure to check on you and address any questions or concerns that you may have regarding the information given to you following your procedure. If we do not reach you, we will leave a message.  We will attempt to reach you two times.  During this call, we will ask if you have developed any symptoms of COVID 19. If you develop any symptoms (ie: fever, flu-like symptoms, shortness of breath, cough etc.) before then, please call [phone].  If you test positive for Covid 19 in the 2 weeks post procedure, please call and report this information to us.    If any biopsies were taken you will be contacted by phone or by letter within the next 1-3 weeks.  Please call us at [phone] if you have not heard about the biopsies in 3 weeks.    SIGNATURES/CONFIDENTIALITY: You and/or your care partner have signed paperwork which will be entered into your electronic medical record.  These signatures attest to the fact that that the information above on your After Visit Summary has been reviewed and is understood.  Full responsibility of the confidentiality of this discharge information lies with you and/or your care-partner. 

## 2021-04-19 NOTE — Progress Notes (Signed)
? ?GASTROENTEROLOGY PROCEDURE H&P NOTE  ? ?Primary Care Physician: ?[name], MD ? ? ? ?Reason for Procedure:   Hx of SSPs at last exam ? ?Plan:    colonoscopy ? ?Patient is appropriate for endoscopic procedure(s) in the ambulatory ([facility]) setting. ? ?The nature of the procedure, as well as the risks, benefits, and alternatives were carefully and thoroughly reviewed with the patient. Ample time for discussion and questions allowed. The patient understood, was satisfied, and agreed to proceed.  ? ? ? ?HPI: ?Abigail [name] is a 56 y.o. female who presents for surveillance colonoscopy.  Medical history as below.  Tolerated the prep.  No recent chest pain or shortness of breath.  No abdominal pain today. ? ?Past Medical History:  ?Diagnosis Date  ? Allergic state 12/04/2010  ? Allergy   ? seasonal- spring  ? Anemia   ? mild  ? Anxiety 02/11/2011  ? BRCA gene mutation positive 11/21/2015  ? Breast cancer ([place]) 06/05/2011  ? RIGHT  BX,POSTERIOR ,SLIGHT LATERAL=FOCAL ATYPIA LOBULAR HYPERPLASIA IN BACKGROUND OF EXTENSIVE STROMAL FIBROSIS  ? Cancer ([place]) 02/21/2011  ? 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 Chicken pox as a child  ? History of mononucleosis   ? History of streptococcal pharyngitis   ? Hot flashes   ? Insomnia 02/11/2011  ? Osteoporosis   ? and osteopenia on [place]  ? Post-operative nausea and vomiting   ? past c-section  ? Preventative health care 12/04/2010  ? S/P chemotherapy, time since less than 4 weeks 05/23/2011  ? completed 4 doses docetaxel/cyclophosphamide  ? Urticaria   ? UTI (lower urinary tract infection) 11/28/2011  ? Vaginitis 10/08/2011  ? ? ?Past Surgical History:  ?Procedure Laterality Date  ? BREAST RECONSTRUCTION Bilateral 03/24/2012  ? Procedure: BREAST RECONSTRUCTION WITH PLACEMENT OF SILICONE GEL IMPLANTS;  Surgeon: Crissie Reese, MD;  Location: [facility];  Service: Plastics;  Laterality: Bilateral;  ? [place]  ? X 1  ? COLONOSCOPY    ? MASTECTOMY  W/ SENTINEL NODE BIOPSY  08/07/2011  ? Procedure: MASTECTOMY WITH SENTINEL LYMPH NODE BIOPSY;  Surgeon: Merrie Roof, MD;  Location: [facility];  Service: General;  Laterality: Bilateral;  bilateral mastectomy  ? MOUTH SURGERY  1997  ? gum surgery  ? POLYPECTOMY    ? TA x 2 2017  ? PORT-A-CATH REMOVAL  08/07/2011  ? Procedure: REMOVAL PORT-A-CATH;  Surgeon: Merrie Roof, MD;  Location: [facility];  Service: General;  Laterality: Right;  ? PORTACATH PLACEMENT  03/20/2011  ? Procedure: INSERTION PORT-A-CATH;  Surgeon: Merrie Roof, MD;  Location: [facility];  Service: General;  Laterality: Right;  placement of port -Right Subclavian Vein  ? TISSUE EXPANDER PLACEMENT  08/07/2011  ? Procedure: TISSUE EXPANDER;  Surgeon: Crissie Reese, MD;  Location: [facility];  Service: Plastics;  Laterality: Bilateral;  BILATERAL PLACEMENT OF TISSUE EXPANDERS FOR BREAST RECONSTRUCTION  ? TUBAL LIGATION    ? 1999  ? VARICOSE VEIN SURGERY    ? [place]  ? ? ?Prior to Admission medications   ?Medication Sig Start Date End Date Taking? Authorizing Provider  ?Calcium Carb-Cholecalciferol (CALCIUM + VITAMIN D3 PO) Take by mouth.   Yes [provider]  ?estradiol (ESTRACE) 0.1 MG/GM vaginal cream Insert 0.5 gram twice a week by vaginal route. 02/28/21  Yes   ?levocetirizine (XYZAL) 5 MG tablet TAKE 1 TABLET BY MOUTH EVERY EVENING 05/24/20 05/24/21 Yes [place],  Percell Miller, PA-C  ?montelukast (SINGULAIR) 10 MG tablet Take 1 tablet (10 mg total) by mouth at bedtime as needed. 12/14/20  Yes [name], MD  ?raloxifene (EVISTA) 60 MG tablet TAKE 1 TABLET BY MOUTH ONCE DAILY 02/28/21  Yes   ?sertraline (ZOLOFT) 50 MG tablet Take one-half tablet by mouth daily x 7 days, then take 1 tablet daily thereafter ?Patient taking differently: Take 25 mg by mouth daily. 12/14/20  Yes [name], MD  ?zolpidem (AMBIEN) 10 MG tablet TAKE 1 TABLET BY MOUTH EVERY NIGHT AT BEDTIME AS NEEDED 12/14/20  Yes [name], MD   ?loratadine (CLARITIN) 10 MG tablet Take 10 mg by mouth 2 (two) times daily.  ?Patient not taking: Reported on 04/05/2021    [provider]  ? ? ?Current Outpatient Medications  ?Medication Sig Dispense Refill  ? Calcium Carb-Cholecalciferol (CALCIUM + VITAMIN D3 PO) Take by mouth.    ? estradiol (ESTRACE) 0.1 MG/GM vaginal cream Insert 0.5 gram twice a week by vaginal route. 42.5 g 4  ? levocetirizine (XYZAL) 5 MG tablet TAKE 1 TABLET BY MOUTH EVERY EVENING 30 tablet 11  ? montelukast (SINGULAIR) 10 MG tablet Take 1 tablet (10 mg total) by mouth at bedtime as needed. 90 tablet 1  ? raloxifene (EVISTA) 60 MG tablet TAKE 1 TABLET BY MOUTH ONCE DAILY 90 tablet 4  ? sertraline (ZOLOFT) 50 MG tablet Take one-half tablet by mouth daily x 7 days, then take 1 tablet daily thereafter (Patient taking differently: Take 25 mg by mouth daily.) 90 tablet 1  ? zolpidem (AMBIEN) 10 MG tablet TAKE 1 TABLET BY MOUTH EVERY NIGHT AT BEDTIME AS NEEDED 15 tablet 3  ? loratadine (CLARITIN) 10 MG tablet Take 10 mg by mouth 2 (two) times daily.  (Patient not taking: Reported on 04/05/2021)    ? ?Current Facility-Administered Medications  ?Medication Dose Route Frequency Provider Last Rate Last Admin  ? 0.9 %  sodium chloride infusion  500 mL Intravenous Once Yeimy Brabant, Lajuan Lines, MD      ? ? ?Allergies as of 04/19/2021 - Review Complete 04/19/2021  ?Allergen Reaction Noted  ? Penicillins Rash   ? ? ?Family History  ?Adopted: Yes  ?Problem Relation Age of Onset  ? Heart disease Mother   ? Heart disease Maternal Uncle   ? Other Maternal Grandmother   ?     mad cow disease  ? Cancer Other   ?     prostate cancer, diffuse in uncles, GF  ? Colon cancer Neg Hx   ? Colon polyps Neg Hx   ? Esophageal cancer Neg Hx   ? Rectal cancer Neg Hx   ? Stomach cancer Neg Hx   ? ? ?Social History  ? ?Socioeconomic History  ? Marital status: Married  ?  Spouse name: Not on file  ? Number of children: 2  ? Years of education: Not on file  ? Highest education  level: Not on file  ?Occupational History  ? Occupation: Halls  ?  Employer: [employer]  ?Tobacco Use  ? Smoking status: Never  ? Smokeless tobacco: Never  ?Substance and Sexual Activity  ? Alcohol use: Yes  ?  Comment: occasionally- social  2 drinks  ? Drug use: No  ? Sexual activity: Yes  ?  Birth control/protection: Surgical  ?Other Topics Concern  ? Not on file  ?Social History Narrative  ? Not on file  ? ?Social Determinants of Health  ? ?Emergency planning/management officer  Strain: Not on file  ?Food Insecurity: Not on file  ?Transportation Needs: Not on file  ?Physical Activity: Not on file  ?Stress: Not on file  ?Social Connections: Not on file  ?Intimate Partner Violence: Not on file  ? ? ?Physical Exam: ?Vital signs in last 24 hours: ?@BP  (!) 103/58   Pulse 61   Temp (!) 97.5 ?F (36.4 ?C)   Ht 5' 6"  (1.676 m)   Wt 140 lb (63.5 kg)   LMP 03/02/2011   SpO2 99%   BMI 22.60 kg/m?  ?GEN: NAD ?EYE: Sclerae anicteric ?ENT: MMM ?CV: Non-tachycardic ?Pulm: CTA b/l ?GI: Soft, NT/ND ?NEURO:  Alert & Oriented x 3 ? ? ?Zenovia Jarred, MD ?[facility] ? ?04/19/2021 1:26 PM ? ?

## 2021-04-23 ENCOUNTER — Telehealth: Payer: Self-pay | Admitting: *Deleted

## 2021-04-23 ENCOUNTER — Telehealth: Payer: Self-pay

## 2021-04-23 NOTE — Telephone Encounter (Signed)
?  Follow up Call- ? ? ?  04/19/2021  ? 12:39 PM  ?Call back number  ?Post procedure Call Back phone  # [phone]  ?Permission to leave phone message Yes  ?  ? ?Patient questions: ? ?Do you have a fever, pain , or abdominal swelling? No. ?Pain Score  0 * ? ?Have you tolerated food without any problems? Yes.   ? ?Have you been able to return to your normal activities? Yes.   ? ?Do you have any questions about your discharge instructions: ?Diet   No. ?Medications  No. ?Follow up visit  No. ? ?Do you have questions or concerns about your Care? No. ? ?Actions: ?* If pain score is 4 or above: ?No action needed, pain <4. ? ? ?

## 2021-04-23 NOTE — Telephone Encounter (Signed)
?  Follow up Call- ? ? ?  04/19/2021  ? 12:39 PM  ?Call back number  ?Post procedure Call Back phone  # [phone]  ?Permission to leave phone message Yes  ?  ? ?Left message ?

## 2021-04-26 ENCOUNTER — Encounter: Payer: Self-pay | Admitting: Internal Medicine

## 2021-05-01 ENCOUNTER — Other Ambulatory Visit (HOSPITAL_BASED_OUTPATIENT_CLINIC_OR_DEPARTMENT_OTHER): Payer: Self-pay

## 2021-05-14 ENCOUNTER — Other Ambulatory Visit (HOSPITAL_BASED_OUTPATIENT_CLINIC_OR_DEPARTMENT_OTHER): Payer: Self-pay

## 2021-05-17 ENCOUNTER — Other Ambulatory Visit: Payer: Self-pay | Admitting: Family Medicine

## 2021-05-17 ENCOUNTER — Encounter: Payer: Self-pay | Admitting: Family Medicine

## 2021-05-17 ENCOUNTER — Other Ambulatory Visit (HOSPITAL_BASED_OUTPATIENT_CLINIC_OR_DEPARTMENT_OTHER): Payer: Self-pay

## 2021-05-17 MED ORDER — BUPROPION HCL ER (XL) 150 MG PO TB24
ORAL_TABLET | ORAL | 2 refills | Status: DC
  Filled 2021-05-17: qty 35, 20d supply, fill #0

## 2021-06-01 ENCOUNTER — Other Ambulatory Visit: Payer: Self-pay | Admitting: Medical

## 2021-06-01 ENCOUNTER — Other Ambulatory Visit (HOSPITAL_BASED_OUTPATIENT_CLINIC_OR_DEPARTMENT_OTHER): Payer: Self-pay

## 2021-06-01 DIAGNOSIS — J309 Allergic rhinitis, unspecified: Secondary | ICD-10-CM

## 2021-06-01 MED ORDER — LEVOCETIRIZINE DIHYDROCHLORIDE 5 MG PO TABS
ORAL_TABLET | Freq: Every evening | ORAL | 11 refills | Status: DC
  Filled 2021-06-01: qty 30, 30d supply, fill #0
  Filled 2021-07-02: qty 30, 30d supply, fill #1

## 2021-06-20 ENCOUNTER — Other Ambulatory Visit (HOSPITAL_BASED_OUTPATIENT_CLINIC_OR_DEPARTMENT_OTHER): Payer: Self-pay

## 2021-06-22 ENCOUNTER — Other Ambulatory Visit (HOSPITAL_BASED_OUTPATIENT_CLINIC_OR_DEPARTMENT_OTHER): Payer: Self-pay

## 2021-06-22 ENCOUNTER — Other Ambulatory Visit: Payer: Self-pay | Admitting: Family Medicine

## 2021-06-22 MED ORDER — ZOLPIDEM TARTRATE 10 MG PO TABS
10.0000 mg | ORAL_TABLET | Freq: Every evening | ORAL | 3 refills | Status: DC | PRN
  Filled 2021-06-22: qty 15, 15d supply, fill #0

## 2021-06-22 NOTE — Telephone Encounter (Signed)
Requesting: Ambien '10mg'$   Contract: None UDS: None Last Visit: 12/14/20 Next Visit: 06/26/21 Last Refill: 12/14/2020 #15 and 1RF  Please Advise

## 2021-06-24 IMAGING — DX DG CHEST 2V
2 series · 2 of 2 positions shown · non-contrast
Comparison: February 13, 2015

CLINICAL DATA: Chest pressure

EXAM:
CHEST - 2 VIEW

[chest pa]
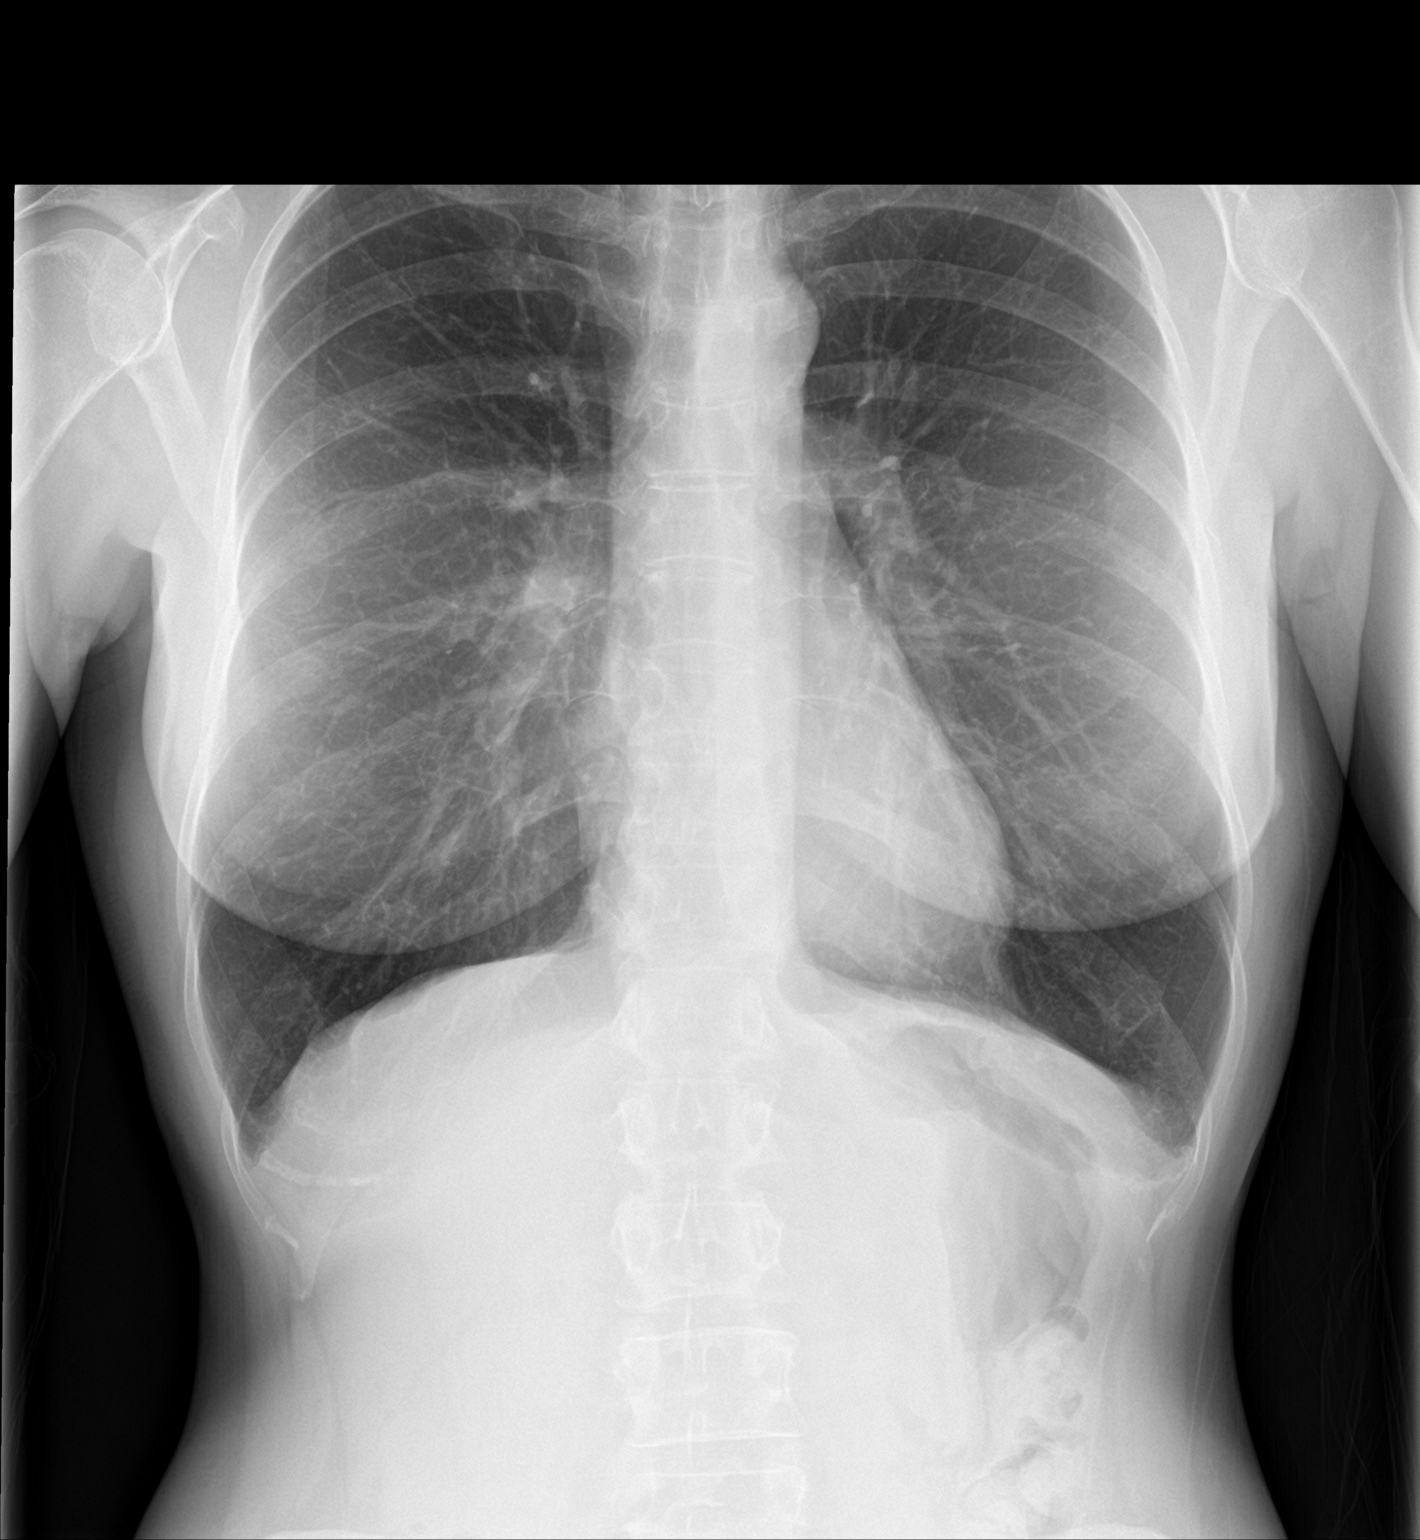

[chest lat]
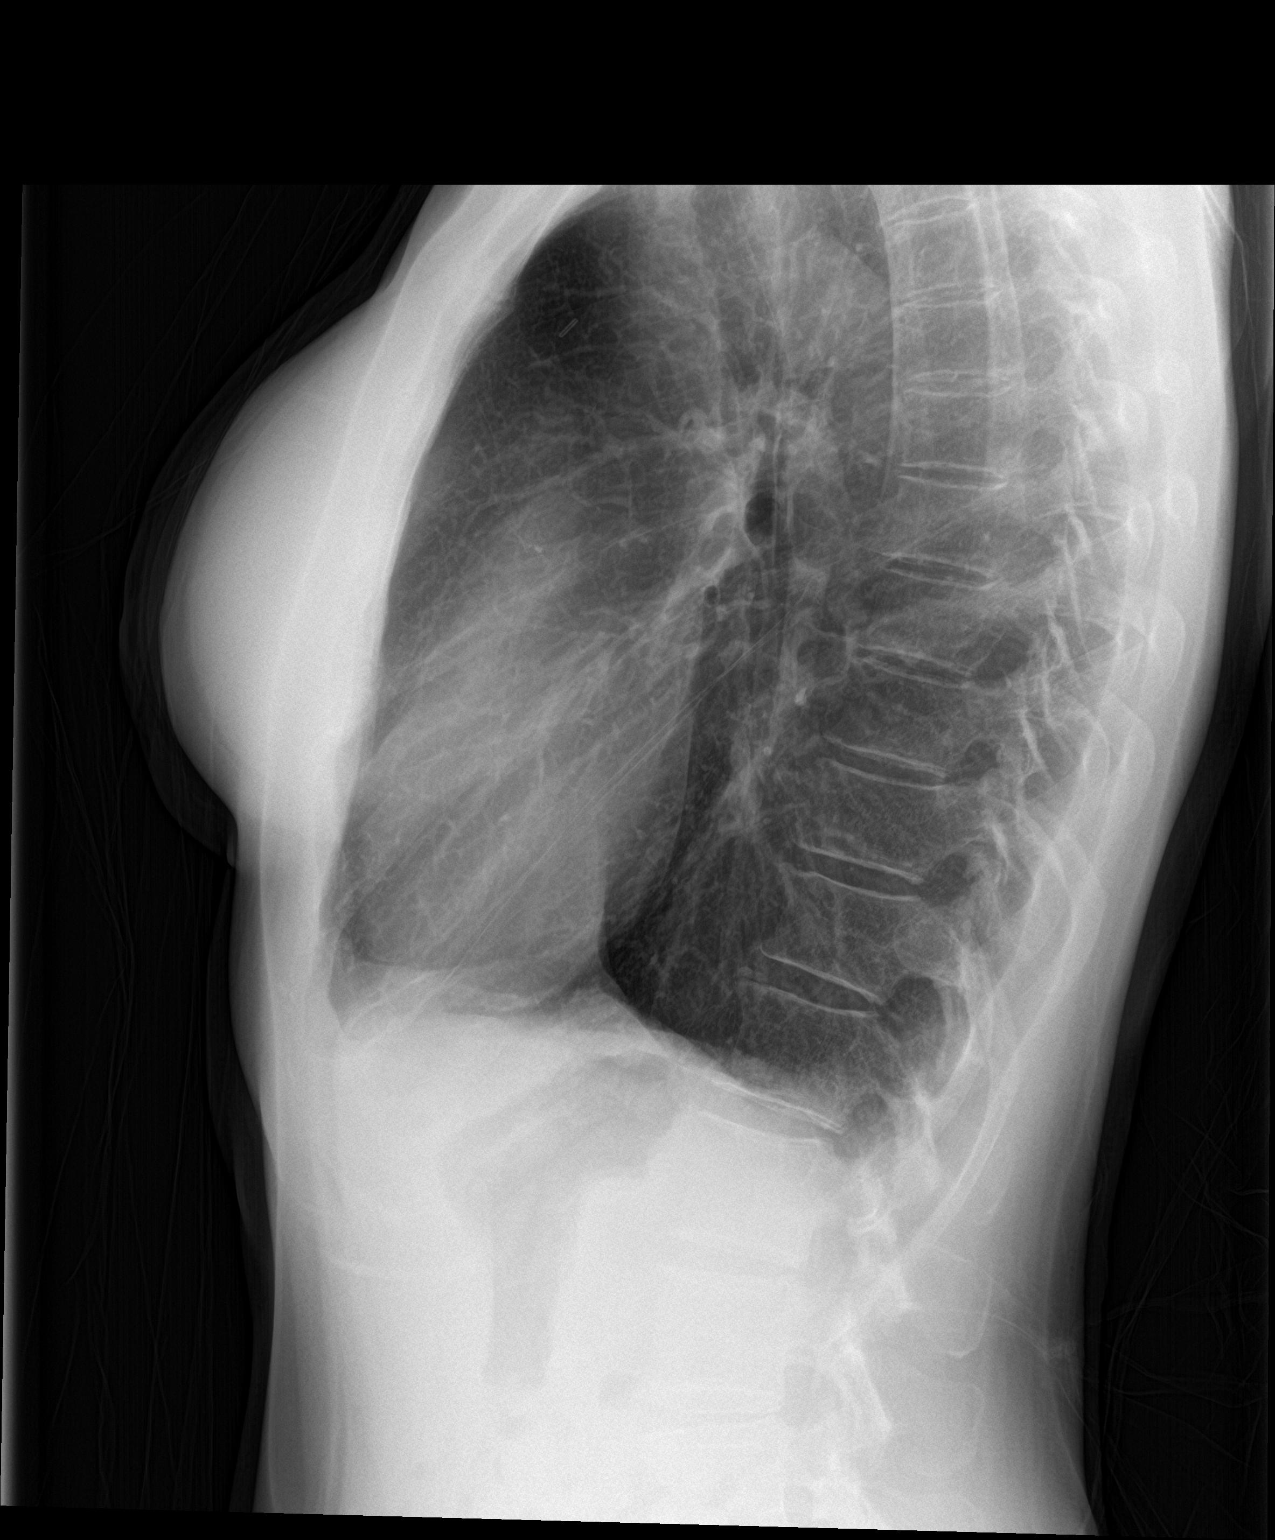

[2 of 2 positions shown; findings below may reference images not displayed]

FINDINGS: Lungs are clear. Heart size and pulmonary vascularity are normal. No
adenopathy. No pneumothorax. No bone lesions.
IMPRESSION: Lungs clear.  Cardiac silhouette within normal limits.

## 2021-06-26 ENCOUNTER — Ambulatory Visit (INDEPENDENT_AMBULATORY_CARE_PROVIDER_SITE_OTHER): Payer: BC Managed Care – PPO | Admitting: Family Medicine

## 2021-06-26 ENCOUNTER — Encounter: Payer: Self-pay | Admitting: Family Medicine

## 2021-06-26 VITALS — BP 116/80 | HR 70 | Resp 20 | Ht 66.0 in | Wt 143.0 lb

## 2021-06-26 DIAGNOSIS — D649 Anemia, unspecified: Secondary | ICD-10-CM

## 2021-06-26 DIAGNOSIS — M810 Age-related osteoporosis without current pathological fracture: Secondary | ICD-10-CM

## 2021-06-26 DIAGNOSIS — F419 Anxiety disorder, unspecified: Secondary | ICD-10-CM

## 2021-06-26 DIAGNOSIS — C50212 Malignant neoplasm of upper-inner quadrant of left female breast: Secondary | ICD-10-CM | POA: Diagnosis not present

## 2021-06-26 DIAGNOSIS — Z0000 Encounter for general adult medical examination without abnormal findings: Secondary | ICD-10-CM

## 2021-06-26 DIAGNOSIS — Z170 Estrogen receptor positive status [ER+]: Secondary | ICD-10-CM

## 2021-06-26 LAB — COMPREHENSIVE METABOLIC PANEL
ALT: 13 U/L (ref 0–35)
AST: 20 U/L (ref 0–37)
Albumin: 4.6 g/dL (ref 3.5–5.2)
Alkaline Phosphatase: 49 U/L (ref 39–117)
BUN: 18 mg/dL (ref 6–23)
CO2: 30 mEq/L (ref 19–32)
Calcium: 10 mg/dL (ref 8.4–10.5)
Chloride: 102 mEq/L (ref 96–112)
Creatinine, Ser: 0.76 mg/dL (ref 0.40–1.20)
GFR: 88.18 mL/min (ref >60.00)
Glucose, Bld: 87 mg/dL (ref 70–99)
Potassium: 3.8 mEq/L (ref 3.5–5.1)
Sodium: 140 mEq/L (ref 135–145)
Total Bilirubin: 0.7 mg/dL (ref 0.2–1.2)
Total Protein: 6.9 g/dL (ref 6.0–8.3)

## 2021-06-26 LAB — LIPID PANEL
Cholesterol: 169 mg/dL (ref 0–200)
HDL: 72.9 mg/dL (ref >39.00)
LDL Cholesterol: 82 mg/dL (ref 0–99)
NonHDL: 96.15
Total CHOL/HDL Ratio: 2
Triglycerides: 69 mg/dL (ref 0.0–149.0)
VLDL: 13.8 mg/dL (ref 0.0–40.0)

## 2021-06-26 LAB — VITAMIN D 25 HYDROXY (VIT D DEFICIENCY, FRACTURES): VITD: 31.15 ng/mL (ref 30.00–100.00)

## 2021-06-26 LAB — CBC
HCT: 37.2 % (ref 36.0–46.0)
Hemoglobin: 12.4 g/dL (ref 12.0–15.0)
MCHC: 33.4 g/dL (ref 30.0–36.0)
MCV: 95.4 fl (ref 78.0–100.0)
Platelets: 230 10*3/uL (ref 150.0–400.0)
RBC: 3.9 Mil/uL (ref 3.87–5.11)
RDW: 12.7 % (ref 11.5–15.5)
WBC: 4.8 10*3/uL (ref 4.0–10.5)

## 2021-06-26 LAB — TSH: TSH: 1.83 u[IU]/mL (ref 0.35–5.50)

## 2021-06-26 NOTE — Patient Instructions (Addendum)
Need a copy of your Advanced care planning documents  Need your HAW immunizations for your chart   Preventive Care 56-56 Years Old, Female Preventive care refers to lifestyle choices and visits with your health care provider that can promote health and wellness. Preventive care visits are also called wellness exams. What can I expect for my preventive care visit? Counseling Your health care provider may ask you questions about your: Medical history, including: Past medical problems. Family medical history. Pregnancy history. Current health, including: Menstrual cycle. Method of birth control. Emotional well-being. Home life and relationship well-being. Sexual activity and sexual health. Lifestyle, including: Alcohol, nicotine or tobacco, and drug use. Access to firearms. Diet, exercise, and sleep habits. Work and work Statistician. Sunscreen use. Safety issues such as seatbelt and bike helmet use. Physical exam Your health care provider will check your: Height and weight. These may be used to calculate your BMI (body mass index). BMI is a measurement that tells if you are at a healthy weight. Waist circumference. This measures the distance around your waistline. This measurement also tells if you are at a healthy weight and may help predict your risk of certain diseases, such as type 2 diabetes and high blood pressure. Heart rate and blood pressure. Body temperature. Skin for abnormal spots. What immunizations do I need?  Vaccines are usually given at various ages, according to a schedule. Your health care provider will recommend vaccines for you based on your age, medical history, and lifestyle or other factors, such as travel or where you work. What tests do I need? Screening Your health care provider may recommend screening tests for certain conditions. This may include: Lipid and cholesterol levels. Diabetes screening. This is done by checking your blood sugar (glucose)  after you have not eaten for a while (fasting). Pelvic exam and Pap test. Hepatitis B test. Hepatitis C test. HIV (human immunodeficiency virus) test. STI (sexually transmitted infection) testing, if you are at risk. Lung cancer screening. Colorectal cancer screening. Mammogram. Talk with your health care provider about when you should start having regular mammograms. This may depend on whether you have a family history of breast cancer. BRCA-related cancer screening. This may be done if you have a family history of breast, ovarian, tubal, or peritoneal cancers. Bone density scan. This is done to screen for osteoporosis. Talk with your health care provider about your test results, treatment options, and if necessary, the need for more tests. Follow these instructions at home: Eating and drinking  Eat a diet that includes fresh fruits and vegetables, whole grains, lean protein, and low-fat dairy products. Take vitamin and mineral supplements as recommended by your health care provider. Do not drink alcohol if: Your health care provider tells you not to drink. You are pregnant, may be pregnant, or are planning to become pregnant. If you drink alcohol: Limit how much you have to 0-1 drink a day. Know how much alcohol is in your drink. In the [place] one drink equals one 12 oz bottle of beer (355 mL), one 5 oz glass of wine (148 mL), or one 1 oz glass of hard liquor (44 mL). Lifestyle Brush your teeth every morning and night with fluoride toothpaste. Floss one time each day. Exercise for at least 30 minutes 5 or more days each week. Do not use any products that contain nicotine or tobacco. These products include cigarettes, chewing tobacco, and vaping devices, such as e-cigarettes. If you need help quitting, ask your health care provider. Do not  use drugs. If you are sexually active, practice safe sex. Use a condom or other form of protection to prevent STIs. If you do not wish to become  pregnant, use a form of birth control. If you plan to become pregnant, see your health care provider for a prepregnancy visit. Take aspirin only as told by your health care provider. Make sure that you understand how much to take and what form to take. Work with your health care provider to find out whether it is safe and beneficial for you to take aspirin daily. Find healthy ways to manage stress, such as: Meditation, yoga, or listening to music. Journaling. Talking to a trusted person. Spending time with friends and family. Minimize exposure to UV radiation to reduce your risk of skin cancer. Safety Always wear your seat belt while driving or riding in a vehicle. Do not drive: If you have been drinking alcohol. Do not ride with someone who has been drinking. When you are tired or distracted. While texting. If you have been using any mind-altering substances or drugs. Wear a helmet and other protective equipment during sports activities. If you have firearms in your house, make sure you follow all gun safety procedures. Seek help if you have been physically or sexually abused. What's next? Visit your health care provider once a year for an annual wellness visit. Ask your health care provider how often you should have your eyes and teeth checked. Stay up to date on all vaccines. This information is not intended to replace advice given to you by your health care provider. Make sure you discuss any questions you have with your health care provider. Document Revised: 07/12/2020 Document Reviewed: 07/12/2020 Elsevier Patient Education  [place].

## 2021-06-26 NOTE — Assessment & Plan Note (Signed)
Diagnosed in 2013, doing well

## 2021-06-26 NOTE — Assessment & Plan Note (Signed)
Encouraged to get adequate exercise, calcium and vitamin d intake 

## 2021-06-26 NOTE — Progress Notes (Signed)
Subjective:   By signing my name below, I, Abigail [name], attest that this documentation has been prepared under the direction and in the presence of [name], MD. 06/26/2021    Patient ID: Abigail [name], female    DOB: 21-Dec-1965, 56 y.o.   MRN: [id number]  Chief Complaint  Patient presents with   Annual Exam    HPI Patient is in today for a comprehensive physical exam.  She is doing well at this time.   She stopped taking Wellbutrin. It caused itchiness and dizziness. She feels better and is not stressed any more. She did not tolerate lexapro, Buspar, Prozac and Zoloft.   She went for an eye exam and was told she had a freckle and low pressure in her left eye. She went to [facility] and will be following up with them annually.   She reports finding a nasal polyp and is symptomatic because the right nostril runs all the time. She used Flonase but it did not help. Has not seen ENT. Denies tenderness and trouble breathing.   She denies fever, congestion, eye pain, chest pain, palpitations, leg swelling, shortness of breath, nausea, abdominal pain, diarrhea and blood in stool. Also denies dysuria, frequency, back pain and headaches.   No changes in family medical history.  She is UTD on shingles and tetanus vaccines. She would like a TB test today.  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   Chicken pox as a child   History of mononucleosis    History of streptococcal pharyngitis    Hot flashes    Insomnia 02/11/2011   Osteoporosis    and osteopenia on Evista   Post-operative nausea and  vomiting    past c-section   Preventative health care 12/04/2010   S/P chemotherapy, time since less than 4 weeks 05/23/2011   completed 4 doses docetaxel/cyclophosphamide   Urticaria    UTI (lower urinary tract infection) 11/28/2011   Vaginitis 10/08/2011    Past Surgical History:  Procedure Laterality Date   BREAST RECONSTRUCTION Bilateral 03/24/2012   Procedure: BREAST RECONSTRUCTION WITH PLACEMENT OF SILICONE GEL IMPLANTS;  Surgeon: Crissie Reese, MD;  Location: [facility];  Service: Plastics;  Laterality: Bilateral;   CESAREAN SECTION  1999   X 1   COLONOSCOPY     MASTECTOMY W/ SENTINEL NODE BIOPSY  08/07/2011   Procedure: MASTECTOMY WITH SENTINEL LYMPH NODE BIOPSY;  Surgeon: Merrie Roof, MD;  Location: [facility];  Service: General;  Laterality: Bilateral;  bilateral mastectomy   MOUTH SURGERY  1997   gum surgery   POLYPECTOMY     TA x 2 2017   PORT-A-CATH REMOVAL  08/07/2011   Procedure: REMOVAL PORT-A-CATH;  Surgeon: Merrie Roof, MD;  Location: [facility];  Service: General;  Laterality: Right;   PORTACATH PLACEMENT  03/20/2011   Procedure: INSERTION PORT-A-CATH;  Surgeon: Merrie Roof, MD;  Location: [facility];  Service: General;  Laterality: Right;  placement of port -Right Subclavian Vein   TISSUE EXPANDER PLACEMENT  08/07/2011   Procedure: TISSUE EXPANDER;  Surgeon:  Crissie Reese, MD;  Location: [facility];  Service: Plastics;  Laterality: Bilateral;  BILATERAL PLACEMENT OF TISSUE EXPANDERS FOR BREAST RECONSTRUCTION   TUBAL LIGATION     1999   [facility]    Family History  Adopted: Yes  Problem Relation Age of Onset   Heart disease Mother    Heart disease Maternal Uncle    Other Maternal Grandmother        mad cow disease   Cancer Other        prostate cancer, diffuse in uncles, GF   Colon cancer Neg Hx    Colon polyps Neg Hx    Esophageal cancer Neg Hx    Rectal cancer Neg Hx    Stomach cancer Neg Hx      Social History   Socioeconomic History   Marital status: Married    Spouse name: Not on file   Number of children: 2   Years of education: Not on file   Highest education level: Not on file  Occupational History    Employer: [employer]   Occupation: [employer]    Employer: [employer]  Tobacco Use   Smoking status: Never   Smokeless tobacco: Never  Substance and Sexual Activity   Alcohol use: Yes    Comment: occasionally- social  2 drinks   Drug use: No   Sexual activity: Yes    Birth control/protection: Surgical  Other Topics Concern   Not on file  Social History Narrative   Not on file   Social Determinants of Health   Financial Resource Strain: Not on file  Food Insecurity: Not on file  Transportation Needs: Not on file  Physical Activity: Not on file  Stress: Not on file  Social Connections: Not on file  Intimate Partner Violence: Not on file    Outpatient Medications Prior to Visit  Medication Sig Dispense Refill   Calcium Carb-Cholecalciferol (CALCIUM + VITAMIN D3 PO) Take 1,200 mg by mouth daily.     estradiol (ESTRACE) 0.1 MG/GM vaginal cream Insert 0.5 gram twice a week by vaginal route. 42.5 g 4   fluticasone (FLONASE) 50 MCG/ACT nasal spray Place into both nostrils as needed for allergies or rhinitis.     levocetirizine (XYZAL) 5 MG tablet TAKE 1 TABLET BY MOUTH EVERY EVENING (Patient taking differently: Take 5 mg by mouth in the morning.) 30 tablet 11   loratadine (CLARITIN) 10 MG tablet Take 10 mg by mouth daily as needed.     montelukast (SINGULAIR) 10 MG tablet Take 1 tablet (10 mg total) by mouth at bedtime as needed. 90 tablet 1   raloxifene (EVISTA) 60 MG tablet TAKE 1 TABLET BY MOUTH ONCE DAILY 90 tablet 4   zolpidem (AMBIEN) 10 MG tablet TAKE 1 TABLET BY MOUTH EVERY NIGHT AT BEDTIME AS NEEDED (Patient taking differently: Take 10 mg by mouth at bedtime as needed. Pt takes 1/3 of tab as needed) 15 tablet 3   buPROPion  (WELLBUTRIN XL) 150 MG 24 hr tablet Take 1 tablet by mouth daily for 4 days, then increase to 2 tablets daily 35 tablet 2   No facility-administered medications prior to visit.    Allergies  Allergen Reactions   Buspar [Buspirone] Itching   Lexapro [Escitalopram] Itching    Itching, dry mouth, constipation   Wellbutrin [Bupropion] Itching    Dizzy and itchy   Penicillins Rash   Prozac [Fluoxetine] Anxiety    Anxiety, itching  Zoloft [Sertraline] Anxiety    Anxiety, itching    Review of Systems  Constitutional:  Negative for fever.  HENT:  Negative for congestion.   Eyes:  Negative for pain.  Respiratory:  Negative for shortness of breath.   Cardiovascular:  Negative for chest pain, palpitations and leg swelling.  Gastrointestinal:  Negative for abdominal pain, blood in stool, diarrhea and nausea.  Genitourinary:  Negative for dysuria and frequency.  Musculoskeletal:  Negative for back pain.  Neurological:  Negative for headaches.      Objective:    Physical Exam Constitutional:      General: She is not in acute distress.    Appearance: She is well-developed.  HENT:     Head: Normocephalic and atraumatic.     Right Ear: Tympanic membrane, ear canal and external ear normal.     Left Ear: Tympanic membrane, ear canal and external ear normal.     Nose:     Comments: Small raised lesion, mildly erythematous in anterior lateral right nares  Eyes:     Conjunctiva/sclera: Conjunctivae normal.  Neck:     Thyroid: No thyromegaly.  Cardiovascular:     Rate and Rhythm: Normal rate and regular rhythm.     Heart sounds: Normal heart sounds. No murmur heard. Pulmonary:     Effort: Pulmonary effort is normal. No respiratory distress.     Breath sounds: Normal breath sounds.  Abdominal:     General: Bowel sounds are normal. There is no distension.     Palpations: Abdomen is soft. There is no mass.     Tenderness: There is no abdominal tenderness.  Musculoskeletal:      Cervical back: Neck supple.  Lymphadenopathy:     Cervical: No cervical adenopathy.  Skin:    General: Skin is warm and dry.  Neurological:     Mental Status: She is alert and oriented to person, place, and time.     Deep Tendon Reflexes:     Reflex Scores:      Patellar reflexes are 2+ on the right side and 2+ on the left side.    Comments: 5/5 strength in upper and lower extremities   Psychiatric:        Behavior: Behavior normal.    BP 116/80 (BP Location: Left Arm, Patient Position: Sitting, Cuff Size: Normal)   Pulse 70   Resp 20   Ht _0  (1.676 m)   Wt 143 lb (64.9 kg)   LMP 03/02/2011   SpO2 98%   BMI 23.08 kg/m  Wt Readings from Last 3 Encounters:  06/26/21 143 lb (64.9 kg)  04/19/21 140 lb (63.5 kg)  04/05/21 140 lb (63.5 kg)    Diabetic Foot Exam - Simple   No data filed    Lab Results  Component Value Date   WBC 3.6 (L) 06/12/2020   HGB 12.6 06/12/2020   HCT 37.4 06/12/2020   PLT 212.0 06/12/2020   GLUCOSE 85 06/12/2020   CHOL 166 06/12/2020   TRIG 75.0 06/12/2020   HDL 72.20 06/12/2020   LDLCALC 79 06/12/2020   ALT 24 06/12/2020   AST 22 06/12/2020   NA 143 06/12/2020   K 4.3 06/12/2020   CL 104 06/12/2020   CREATININE 0.83 06/12/2020   BUN 19 06/12/2020   CO2 32 06/12/2020   TSH 1.74 06/12/2020   HGBA1C 5.7 12/01/2017    Lab Results  Component Value Date   TSH 1.74 06/12/2020   Lab Results  Component Value Date  WBC 3.6 (L) 06/12/2020   HGB 12.6 06/12/2020   HCT 37.4 06/12/2020   MCV 94.7 06/12/2020   PLT 212.0 06/12/2020   Lab Results  Component Value Date   NA 143 06/12/2020   K 4.3 06/12/2020   CHLORIDE 106 04/08/2016   CO2 32 06/12/2020   GLUCOSE 85 06/12/2020   BUN 19 06/12/2020   CREATININE 0.83 06/12/2020   BILITOT 0.6 06/12/2020   ALKPHOS 48 06/12/2020   AST 22 06/12/2020   ALT 24 06/12/2020   PROT 6.7 06/12/2020   ALBUMIN 4.5 06/12/2020   CALCIUM 9.8 06/12/2020   ANIONGAP 7 04/08/2016   EGFR 79 (L)  04/08/2016   GFR 79.92 06/12/2020   Lab Results  Component Value Date   CHOL 166 06/12/2020   Lab Results  Component Value Date   HDL 72.20 06/12/2020   Lab Results  Component Value Date   LDLCALC 79 06/12/2020   Lab Results  Component Value Date   TRIG 75.0 06/12/2020   Lab Results  Component Value Date   CHOLHDL 2 06/12/2020   Lab Results  Component Value Date   HGBA1C 5.7 12/01/2017        Colonoscopy- Last completed on 04/19/2021. Polyp was found in the transverse colon and was resected and retrieved. There were small internal hemorrhoids. Repeat in 5 years. Pap smear- Last checked on 01/24/2020. Results were normal. Repeat in 3-5 years. Dexa- Last checked on 01/19/2020. Patient was osteoporotic. Repeat in 2 years.  Assessment & Plan:   Problem List Items Addressed This Visit     Preventative health care - Primary    Patient encouraged to maintain heart healthy diet, regular exercise, adequate sleep. Consider daily probiotics. Take medications as prescribed. Labs ordered and reviewed. 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 Colonoscopy- Last completed on 04/19/2021. Polyp was found in the transverse colon and was resected and retrieved. There were small internal hemorrhoids. Repeat in 5 years. Pap smear- Last checked on 01/24/2020. Results were normal. Repeat in 3-5 years. Dexa- Last checked on 01/19/2020. Patient was osteoporotic. Repeat next year.       Relevant Orders   TSH   Lipid panel   Comprehensive metabolic panel   QuantiFERON-TB Gold Plus   Anxiety    Is doing much better without meds and with a new job so will stay off meds for now.       Anemia   Relevant Orders   CBC   Breast cancer of upper-inner quadrant of left female breast ([place])    Diagnosed in 2013, doing well        Osteoporosis    Encouraged to get adequate exercise, calcium and vitamin d intake        Relevant Orders   VITAMIN D 25 Hydroxy (Vit-D Deficiency, Fractures)    No orders of the defined types were placed in this encounter.   I,Abigail [name],acting as a Education administrator for Penni Homans, MD.,have documented all relevant documentation on the behalf of Penni Homans, MD,as directed by  Penni Homans, MD while in the presence of Penni Homans, MD.   I, [name], MD., personally preformed the services described in this documentation.  All medical record entries made by the scribe were at my direction and in my presence.  I have reviewed the chart and discharge instructions (if applicable) and agree that the record reflects my personal performance and is accurate and complete.  [name], MD. 06/26/2021

## 2021-06-26 NOTE — Assessment & Plan Note (Signed)
Is doing much better without meds and with a new job so will stay off meds for now.

## 2021-06-26 NOTE — Assessment & Plan Note (Addendum)
Patient encouraged to maintain heart healthy diet, regular exercise, adequate sleep. Consider daily probiotics. Take medications as prescribed. Labs ordered and reviewed. 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 Colonoscopy- Last completed on 04/19/2021. Polyp was found in the transverse colon and was resected and retrieved. There were small internal hemorrhoids. Repeat in 5 years. Pap smear- Last checked on 01/24/2020. Results were normal. Repeat in 3-5 years. Dexa- Last checked on 01/19/2020. Patient was osteoporotic. Repeat next year.

## 2021-06-29 LAB — QUANTIFERON-TB GOLD PLUS
Mitogen-NIL: >10 IU/mL
NIL: 0.03 IU/mL
QuantiFERON-TB Gold Plus: NEGATIVE
TB1-NIL: 0 IU/mL
TB2-NIL: <0 IU/mL

## 2021-07-02 ENCOUNTER — Other Ambulatory Visit (HOSPITAL_BASED_OUTPATIENT_CLINIC_OR_DEPARTMENT_OTHER): Payer: Self-pay

## 2021-08-01 ENCOUNTER — Other Ambulatory Visit (HOSPITAL_BASED_OUTPATIENT_CLINIC_OR_DEPARTMENT_OTHER): Payer: Self-pay

## 2021-08-02 ENCOUNTER — Other Ambulatory Visit (HOSPITAL_BASED_OUTPATIENT_CLINIC_OR_DEPARTMENT_OTHER): Payer: Self-pay

## 2021-08-03 ENCOUNTER — Other Ambulatory Visit (HOSPITAL_BASED_OUTPATIENT_CLINIC_OR_DEPARTMENT_OTHER): Payer: Self-pay

## 2021-08-17 ENCOUNTER — Encounter: Payer: Self-pay | Admitting: Family Medicine

## 2021-08-17 ENCOUNTER — Ambulatory Visit (HOSPITAL_BASED_OUTPATIENT_CLINIC_OR_DEPARTMENT_OTHER)
Admission: RE | Admit: 2021-08-17 | Discharge: 2021-08-17 | Disposition: A | Discharge status: Home / Self Care | Payer: BC Managed Care – PPO | Source: Ambulatory Visit | Attending: Family Medicine | Admitting: Family Medicine

## 2021-08-17 ENCOUNTER — Ambulatory Visit: Payer: BC Managed Care – PPO | Admitting: Family Medicine

## 2021-08-17 ENCOUNTER — Other Ambulatory Visit (HOSPITAL_BASED_OUTPATIENT_CLINIC_OR_DEPARTMENT_OTHER): Payer: Self-pay

## 2021-08-17 VITALS — BP 131/57 | HR 62 | Ht 66.0 in | Wt 140.8 lb

## 2021-08-17 DIAGNOSIS — M25521 Pain in right elbow: Secondary | ICD-10-CM | POA: Diagnosis present

## 2021-08-17 MED ORDER — SULFAMETHOXAZOLE-TRIMETHOPRIM 800-160 MG PO TABS
1.0000 | ORAL_TABLET | Freq: Two times a day (BID) | ORAL | 0 refills | Status: AC
  Filled 2021-08-17: qty 10, 5d supply, fill #0

## 2021-08-17 MED ORDER — MELOXICAM 15 MG PO TABS
15.0000 mg | ORAL_TABLET | Freq: Every day | ORAL | 0 refills | Status: DC
  Filled 2021-08-17: qty 30, 30d supply, fill #0

## 2021-08-17 NOTE — Patient Instructions (Signed)
Start with Meloxicam (do not take with any other NSAIDs). Rest, ice, heat, massage, gentle stretches. Try to avoid triggering movements.  If it becomes bright red, severe inflammation/pain concerning for infection, I'll send you an antibiotic to have on hold since we are going into the weekend. Otherwise, wait and follow-up with Ortho (referral placed).

## 2021-08-17 NOTE — Progress Notes (Signed)
   Acute Office Visit  Subjective:     Patient ID: Abigail [name], female    DOB: 07-05-1965, 56 y.o.   MRN: [id number]  CC: right elbow pain   HPI Patient is in today for right elbow pain.  Patient reports she moved about 6 weeks ago and while lifting boxes she recalls pulling her fingers/wrist/forearm with pain all the way up to right lateral elbow. She has been working on rest, ice, immobility, but pain has continued to gradually worsen. She has been having some decreased range of motion due to the pain along with inflammation. A few nights ago her elbow got very red and started streaking, which concerned her for possible infection.     ROS All review of systems negative except what is listed in the HPI      Objective:    BP (!) 131/57   Pulse 62   Ht '5\' 6"'$  (1.676 m)   Wt 140 lb 12.8 oz (63.9 kg)   LMP 03/02/2011   BMI 22.73 kg/m    Physical Exam Vitals reviewed.  Constitutional:      General: She is not in acute distress.    Appearance: Normal appearance. She is normal weight. She is not ill-appearing.  Musculoskeletal:        General: Swelling and tenderness present.     Comments: Right lateral elbow with mild inflammation and tenderness to palpation; decreased flexion/extension and grip due to discomfort   Skin:    General: Skin is warm and dry.     Findings: No bruising, erythema or rash.  Neurological:     General: No focal deficit present.     Mental Status: She is alert and oriented to person, place, and time. Mental status is at baseline.  Psychiatric:        Mood and Affect: Mood normal.        Behavior: Behavior normal.        Thought Content: Thought content normal.        Judgment: Judgment normal.     No results found for any visits on 08/17/21.      Assessment & Plan:   1. Right elbow pain No signs of infection today, but she had a picture on phone of elbow 2 nights ago that was very red with streaking. .  Start with Meloxicam (do not  take with any other NSAIDs). Rest, ice, heat, massage, gentle stretches. Try to avoid triggering movements.  If it becomes bright red, severe inflammation/pain concerning for infection, I'll send you an antibiotic to have on hold since we are going into the weekend. Otherwise, wait and follow-up with Ortho (referral placed).    - Ambulatory referral to Orthopedic Surgery - meloxicam (MOBIC) 15 MG tablet; Take 1 tablet (15 mg total) by mouth daily.  Dispense: 30 tablet; Refill: 0 - sulfamethoxazole-trimethoprim (BACTRIM DS) 800-160 MG tablet; Take 1 tablet by mouth 2 (two) times daily for 5 days.  Dispense: 10 tablet; Refill: 0 - DG ELBOW COMPLETE RIGHT (3+VIEW); Future   Return if symptoms worsen or fail to improve.  Terrilyn Saver, NP

## 2021-08-22 ENCOUNTER — Encounter: Payer: Self-pay | Admitting: *Deleted

## 2021-08-24 ENCOUNTER — Encounter: Payer: Self-pay | Admitting: Family Medicine

## 2021-11-08 ENCOUNTER — Other Ambulatory Visit: Payer: Self-pay | Admitting: Family Medicine

## 2021-11-08 ENCOUNTER — Other Ambulatory Visit: Payer: Self-pay

## 2021-11-08 DIAGNOSIS — T7840XD Allergy, unspecified, subsequent encounter: Secondary | ICD-10-CM

## 2021-11-08 MED ORDER — MONTELUKAST SODIUM 10 MG PO TABS
10.0000 mg | ORAL_TABLET | Freq: Every evening | ORAL | 2 refills | Status: DC | PRN
  Filled 2021-11-08: qty 90, 90d supply, fill #0
  Filled 2022-02-20: qty 90, 90d supply, fill #1
  Filled 2022-06-04: qty 90, 90d supply, fill #2

## 2022-01-30 ENCOUNTER — Other Ambulatory Visit: Payer: Self-pay | Admitting: Family Medicine

## 2022-03-28 ENCOUNTER — Telehealth: Payer: Self-pay | Admitting: Family Medicine

## 2022-03-28 NOTE — Telephone Encounter (Signed)
Medical records received from [facility]. Transferred to [facility]

## 2022-05-02 LAB — HM PAP SMEAR: HM Pap smear: NEGATIVE

## 2022-06-27 ENCOUNTER — Other Ambulatory Visit: Payer: Self-pay | Admitting: Medical

## 2022-06-27 DIAGNOSIS — J309 Allergic rhinitis, unspecified: Secondary | ICD-10-CM

## 2022-06-30 NOTE — Assessment & Plan Note (Signed)
Patient encouraged to maintain heart healthy diet, regular exercise, adequate sleep. Consider daily probiotics. Take medications as prescribed. Labs ordered and reviewed. 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 Colonoscopy- Last completed on 04/19/2021. Polyp was found in the transverse colon and was resected and retrieved. There were small internal hemorrhoids. Repeat in 5 years. Pap smear- Last checked on May 2024 Dr Rana Snare will request records Dexa- Last checked on 2024. Patient was osteopenia has improved

## 2022-06-30 NOTE — Assessment & Plan Note (Addendum)
Encouraged to get adequate exercise, calcium and vitamin d intake  Dexa done at Dr [facility] Sn[facility] has improved to osteopenia with treatment is stable is on Evista. D3 1000 qod, calcium slow release 1200 mg daily. Check Vitamin D level

## 2022-07-01 ENCOUNTER — Ambulatory Visit (INDEPENDENT_AMBULATORY_CARE_PROVIDER_SITE_OTHER): Payer: BC Managed Care – PPO | Admitting: Family Medicine

## 2022-07-01 VITALS — BP 122/78 | HR 64 | Temp 98.1°F | Resp 16 | Ht 66.0 in | Wt 141.0 lb

## 2022-07-01 DIAGNOSIS — G4700 Insomnia, unspecified: Secondary | ICD-10-CM

## 2022-07-01 DIAGNOSIS — M79601 Pain in right arm: Secondary | ICD-10-CM

## 2022-07-01 DIAGNOSIS — R739 Hyperglycemia, unspecified: Secondary | ICD-10-CM

## 2022-07-01 DIAGNOSIS — Z0000 Encounter for general adult medical examination without abnormal findings: Secondary | ICD-10-CM | POA: Diagnosis not present

## 2022-07-01 DIAGNOSIS — Z170 Estrogen receptor positive status [ER+]: Secondary | ICD-10-CM

## 2022-07-01 DIAGNOSIS — L989 Disorder of the skin and subcutaneous tissue, unspecified: Secondary | ICD-10-CM

## 2022-07-01 DIAGNOSIS — C50212 Malignant neoplasm of upper-inner quadrant of left female breast: Secondary | ICD-10-CM

## 2022-07-01 DIAGNOSIS — L578 Other skin changes due to chronic exposure to nonionizing radiation: Secondary | ICD-10-CM

## 2022-07-01 DIAGNOSIS — R252 Cramp and spasm: Secondary | ICD-10-CM

## 2022-07-01 DIAGNOSIS — M810 Age-related osteoporosis without current pathological fracture: Secondary | ICD-10-CM | POA: Diagnosis not present

## 2022-07-01 DIAGNOSIS — R319 Hematuria, unspecified: Secondary | ICD-10-CM

## 2022-07-01 LAB — URINALYSIS, ROUTINE W REFLEX MICROSCOPIC
Bilirubin Urine: NEGATIVE
Hgb urine dipstick: NEGATIVE
Ketones, ur: NEGATIVE
Leukocytes,Ua: NEGATIVE
Nitrite: NEGATIVE
RBC / HPF: NONE SEEN (ref 0–?)
Specific Gravity, Urine: 1.01 (ref 1.000–1.030)
Total Protein, Urine: NEGATIVE
Urine Glucose: NEGATIVE
Urobilinogen, UA: 0.2 (ref 0.0–1.0)
pH: 7.5 (ref 5.0–8.0)

## 2022-07-01 LAB — COMPREHENSIVE METABOLIC PANEL
ALT: 19 U/L (ref 0–35)
AST: 22 U/L (ref 0–37)
Albumin: 4.5 g/dL (ref 3.5–5.2)
Alkaline Phosphatase: 43 U/L (ref 39–117)
BUN: 16 mg/dL (ref 6–23)
CO2: 30 mEq/L (ref 19–32)
Calcium: 10 mg/dL (ref 8.4–10.5)
Chloride: 103 mEq/L (ref 96–112)
Creatinine, Ser: 0.78 mg/dL (ref 0.40–1.20)
GFR: 84.87 mL/min (ref >60.00)
Glucose, Bld: 90 mg/dL (ref 70–99)
Potassium: 4.1 mEq/L (ref 3.5–5.1)
Sodium: 143 mEq/L (ref 135–145)
Total Bilirubin: 0.4 mg/dL (ref 0.2–1.2)
Total Protein: 7.1 g/dL (ref 6.0–8.3)

## 2022-07-01 LAB — LIPID PANEL
Cholesterol: 161 mg/dL (ref 0–200)
HDL: 68.8 mg/dL (ref >39.00)
LDL Cholesterol: 80 mg/dL (ref 0–99)
NonHDL: 92.67
Total CHOL/HDL Ratio: 2
Triglycerides: 64 mg/dL (ref 0.0–149.0)
VLDL: 12.8 mg/dL (ref 0.0–40.0)

## 2022-07-01 LAB — TSH: TSH: 1.53 u[IU]/mL (ref 0.35–5.50)

## 2022-07-01 LAB — CBC WITH DIFFERENTIAL/PLATELET
Basophils Absolute: 0 10*3/uL (ref 0.0–0.1)
Basophils Relative: 0.9 % (ref 0.0–3.0)
Eosinophils Absolute: 0.1 10*3/uL (ref 0.0–0.7)
Eosinophils Relative: 2.5 % (ref 0.0–5.0)
HCT: 37.1 % (ref 36.0–46.0)
Hemoglobin: 12 g/dL (ref 12.0–15.0)
Lymphocytes Relative: 42 % (ref 12.0–46.0)
Lymphs Abs: 1.9 10*3/uL (ref 0.7–4.0)
MCHC: 32.2 g/dL (ref 30.0–36.0)
MCV: 96.5 fl (ref 78.0–100.0)
Monocytes Absolute: 0.3 10*3/uL (ref 0.1–1.0)
Monocytes Relative: 6.8 % (ref 3.0–12.0)
Neutro Abs: 2.2 10*3/uL (ref 1.4–7.7)
Neutrophils Relative %: 47.8 % (ref 43.0–77.0)
Platelets: 253 10*3/uL (ref 150.0–400.0)
RBC: 3.84 Mil/uL — ABNORMAL LOW (ref 3.87–5.11)
RDW: 13 % (ref 11.5–15.5)
WBC: 4.6 10*3/uL (ref 4.0–10.5)

## 2022-07-01 LAB — MAGNESIUM: Magnesium: 2.2 mg/dL (ref 1.5–2.5)

## 2022-07-01 LAB — HEMOGLOBIN A1C: Hgb A1c MFr Bld: 5.5 % (ref 4.6–6.5)

## 2022-07-01 LAB — VITAMIN D 25 HYDROXY (VIT D DEFICIENCY, FRACTURES): VITD: 37.96 ng/mL (ref 30.00–100.00)

## 2022-07-01 NOTE — Patient Instructions (Addendum)
Magnesium Glycinate 200-400 mg at bedtime   Preventive Care 7-57 Years Old, Female Preventive care refers to lifestyle choices and visits with your health care provider that can promote health and wellness. Preventive care visits are also called wellness exams. What can I expect for my preventive care visit? Counseling Your health care provider may ask you questions about your: Medical history, including: Past medical problems. Family medical history. Pregnancy history. Current health, including: Menstrual cycle. Method of birth control. Emotional well-being. Home life and relationship well-being. Sexual activity and sexual health. Lifestyle, including: Alcohol, nicotine or tobacco, and drug use. Access to firearms. Diet, exercise, and sleep habits. Work and work Astronomer. Sunscreen use. Safety issues such as seatbelt and bike helmet use. Physical exam Your health care provider will check your: Height and weight. These may be used to calculate your BMI (body mass index). BMI is a measurement that tells if you are at a healthy weight. Waist circumference. This measures the distance around your waistline. This measurement also tells if you are at a healthy weight and may help predict your risk of certain diseases, such as type 2 diabetes and high blood pressure. Heart rate and blood pressure. Body temperature. Skin for abnormal spots. What immunizations do I need?  Vaccines are usually given at various ages, according to a schedule. Your health care provider will recommend vaccines for you based on your age, medical history, and lifestyle or other factors, such as travel or where you work. What tests do I need? Screening Your health care provider may recommend screening tests for certain conditions. This may include: Lipid and cholesterol levels. Diabetes screening. This is done by checking your blood sugar (glucose) after you have not eaten for a while (fasting). Pelvic  exam and Pap test. Hepatitis B test. Hepatitis C test. HIV (human immunodeficiency virus) test. STI (sexually transmitted infection) testing, if you are at risk. Lung cancer screening. Colorectal cancer screening. Mammogram. Talk with your health care provider about when you should start having regular mammograms. This may depend on whether you have a family history of breast cancer. BRCA-related cancer screening. This may be done if you have a family history of breast, ovarian, tubal, or peritoneal cancers. Bone density scan. This is done to screen for osteoporosis. Talk with your health care provider about your test results, treatment options, and if necessary, the need for more tests. Follow these instructions at home: Eating and drinking  Eat a diet that includes fresh fruits and vegetables, whole grains, lean protein, and low-fat dairy products. Take vitamin and mineral supplements as recommended by your health care provider. Do not drink alcohol if: Your health care provider tells you not to drink. You are pregnant, may be pregnant, or are planning to become pregnant. If you drink alcohol: Limit how much you have to 0-1 drink a day. Know how much alcohol is in your drink. In the [place] one drink equals one 12 oz bottle of beer (355 mL), one 5 oz glass of wine (148 mL), or one 1 oz glass of hard liquor (44 mL). Lifestyle Brush your teeth every morning and night with fluoride toothpaste. Floss one time each day. Exercise for at least 30 minutes 5 or more days each week. Do not use any products that contain nicotine or tobacco. These products include cigarettes, chewing tobacco, and vaping devices, such as e-cigarettes. If you need help quitting, ask your health care provider. Do not use drugs. If you are sexually active, practice safe sex. Use  a condom or other form of protection to prevent STIs. If you do not wish to become pregnant, use a form of birth control. If you plan to become  pregnant, see your health care provider for a prepregnancy visit. Take aspirin only as told by your health care provider. Make sure that you understand how much to take and what form to take. Work with your health care provider to find out whether it is safe and beneficial for you to take aspirin daily. Find healthy ways to manage stress, such as: Meditation, yoga, or listening to music. Journaling. Talking to a trusted person. Spending time with friends and family. Minimize exposure to UV radiation to reduce your risk of skin cancer. Safety Always wear your seat belt while driving or riding in a vehicle. Do not drive: If you have been drinking alcohol. Do not ride with someone who has been drinking. When you are tired or distracted. While texting. If you have been using any mind-altering substances or drugs. Wear a helmet and other protective equipment during sports activities. If you have firearms in your house, make sure you follow all gun safety procedures. Seek help if you have been physically or sexually abused. What's next? Visit your health care provider once a year for an annual wellness visit. Ask your health care provider how often you should have your eyes and teeth checked. Stay up to date on all vaccines. This information is not intended to replace advice given to you by your health care provider. Make sure you discuss any questions you have with your health care provider. Document Revised: 07/12/2020 Document Reviewed: 07/12/2020 Elsevier Patient Education  2024 [employer].

## 2022-07-01 NOTE — Assessment & Plan Note (Addendum)
Some Tennis elbow right arm after moving boxes while moving. Encouraged moist heat and gentle stretching as tolerated. May try NSAIDs and prescription meds as directed and report if symptoms worsen or seek immediate care consider referral to sports med if persists

## 2022-07-01 NOTE — Assessment & Plan Note (Signed)
hgba1c acceptable, minimize simple carbs. Increase exercise as tolerated.  

## 2022-07-01 NOTE — Assessment & Plan Note (Signed)
S/p tamoxifen and bilateral mastectomy and released from Oncology

## 2022-07-03 LAB — URINE CULTURE
MICRO NUMBER:: 15032603
SPECIMEN QUALITY:: ADEQUATE

## 2022-07-08 ENCOUNTER — Encounter: Payer: Self-pay | Admitting: Family Medicine

## 2022-07-08 NOTE — Progress Notes (Signed)
Subjective:    Patient ID: Abigail [name], female    DOB: November 14, 1965, 57 y.o.   MRN: [id number]  Chief Complaint  Patient presents with   Annual Exam    Annual Exam     HPI Discussed the use of AI scribe software for clinical note transcription with the patient, who gave verbal consent to proceed.  History of Present Illness   The patient, with a past medical history of breast cancer, osteopenia, and occasional sleep issues, presents for a routine annual check-up and follow up on chronic medical concerns. They report taking over-the-counter magnesium supplements every other day to help with frequent muscle cramps, particularly in the feet. However, they have not noticed any significant improvement. The patient also mentions experiencing symptoms suggestive of urinary tract infections approximately once every other month, including frequency, burning, and hematuria. They manage these symptoms by increasing fluid intake and using over-the-counter cranberry tablets, which usually resolves the symptoms. The patient also reports occasional sleep issues, for which they take a third of an Ambien tablet as needed. They note that without the medication, they often wake up at 2 AM and have difficulty returning to sleep. The patient also mentions a history of osteopenia, managed with Evista, vitamin D3, and daily calcium supplements. They report no recent emergency room visits or major illnesses.    She stays busy with activity and follows a heart healthy diet. 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   Chicken  pox as a child   History of mononucleosis    History of streptococcal pharyngitis    Hot flashes    Insomnia 02/11/2011   Osteoporosis    and osteopenia on Evista   Post-operative nausea and vomiting    past c-section   Preventative health care 12/04/2010   S/P chemotherapy, time since less than 4 weeks 05/23/2011   completed 4 doses docetaxel/cyclophosphamide   Urticaria    UTI (lower urinary tract infection) 11/28/2011   Vaginitis 10/08/2011    Past Surgical History:  Procedure Laterality Date   BREAST RECONSTRUCTION Bilateral 03/24/2012   Procedure: BREAST RECONSTRUCTION WITH PLACEMENT OF SILICONE GEL IMPLANTS;  Surgeon: Etter Sjogren, MD;  Location: [facility] OR;  Service: Plastics;  Laterality: Bilateral;   CESAREAN SECTION  1999   X 1   COLONOSCOPY     MASTECTOMY W/ SENTINEL NODE BIOPSY  08/07/2011   Procedure: MASTECTOMY WITH SENTINEL LYMPH NODE BIOPSY;  Surgeon: Robyne Askew, MD;  Location: [facility] OR;  Service: General;  Laterality: Bilateral;  bilateral mastectomy   MOUTH SURGERY  1997   gum surgery   POLYPECTOMY     TA x 2 2017   PORT-A-CATH REMOVAL  08/07/2011   Procedure: REMOVAL PORT-A-CATH;  Surgeon: Robyne Askew, MD;  Location: [facility] OR;  Service: General;  Laterality: Right;   PORTACATH PLACEMENT  03/20/2011   Procedure: INSERTION PORT-A-CATH;  Surgeon: Robyne Askew, MD;  Location: [facility];  Service: General;  Laterality: Right;  placement of port -Right Subclavian Vein   TISSUE EXPANDER PLACEMENT  08/07/2011  Procedure: TISSUE EXPANDER;  Surgeon: Etter Sjogren, MD;  Location: [facility] OR;  Service: Plastics;  Laterality: Bilateral;  BILATERAL PLACEMENT OF TISSUE EXPANDERS FOR BREAST RECONSTRUCTION   TUBAL LIGATION     1999   VARICOSE VEIN SURGERY     [facility]    Family History  Adopted: Yes  Problem Relation Age of Onset   Heart disease Mother    Heart disease Maternal Uncle    Other Maternal Grandmother        mad cow disease    Cancer Other        prostate cancer, diffuse in uncles, GF   Colon cancer Neg Hx    Colon polyps Neg Hx    Esophageal cancer Neg Hx    Rectal cancer Neg Hx    Stomach cancer Neg Hx     Social History   Socioeconomic History   Marital status: Married    Spouse name: Not on file   Number of children: 2   Years of education: Not on file   Highest education level: Not on file  Occupational History    Employer: [employer]   Occupation: [employer]    Employer: [employer]  Tobacco Use   Smoking status: Never   Smokeless tobacco: Never  Substance and Sexual Activity   Alcohol use: Yes    Comment: occasionally- social  2 drinks   Drug use: No   Sexual activity: Yes    Birth control/protection: Surgical  Other Topics Concern   Not on file  Social History Narrative   Not on file   Social Determinants of Health   Financial Resource Strain: Not on file  Food Insecurity: Not on file  Transportation Needs: Not on file  Physical Activity: Not on file  Stress: Not on file  Social Connections: Not on file  Intimate Partner Violence: Not on file    Outpatient Medications Prior to Visit  Medication Sig Dispense Refill   Calcium Carb-Cholecalciferol (CALCIUM + VITAMIN D3 PO) Take 1,200 mg by mouth daily.     estradiol (ESTRACE) 0.1 MG/GM vaginal cream Insert 0.5 gram twice a week by vaginal route. 42.5 g 4   fluticasone (FLONASE) 50 MCG/ACT nasal spray Place into both nostrils as needed for allergies or rhinitis.     levocetirizine (XYZAL) 5 MG tablet TAKE 1 TABLET BY MOUTH EVERY MORNING 90 tablet 3   loratadine (CLARITIN) 10 MG tablet Take 10 mg by mouth daily as needed.     montelukast (SINGULAIR) 10 MG tablet Take 1 tablet (10 mg total) by mouth at bedtime as needed. 90 tablet 2   raloxifene (EVISTA) 60 MG tablet TAKE 1 TABLET BY MOUTH ONCE DAILY 90 tablet 4   zolpidem (AMBIEN) 10 MG tablet TAKE 1 TABLET BY MOUTH EVERY NIGHT AT BEDTIME AS NEEDED 15  tablet 3   meloxicam (MOBIC) 15 MG tablet Take 1 tablet (15 mg total) by mouth daily. 30 tablet 0   No facility-administered medications prior to visit.    Allergies  Allergen Reactions   Buspar [Buspirone] Itching   Lexapro [Escitalopram] Itching    Itching, dry mouth, constipation   Wellbutrin [Bupropion] Itching    Dizzy and itchy   Penicillins Rash   Prozac [Fluoxetine] Anxiety    Anxiety, itching   Zoloft [Sertraline] Anxiety    Anxiety, itching    Review of Systems  Constitutional:  Negative for chills, fever and malaise/fatigue.  HENT:  Negative for congestion and hearing loss.  Eyes:  Negative for discharge.  Respiratory:  Negative for cough, sputum production and shortness of breath.   Cardiovascular:  Negative for chest pain, palpitations and leg swelling.  Gastrointestinal:  Negative for abdominal pain, blood in stool, constipation, diarrhea, heartburn, nausea and vomiting.  Genitourinary:  Positive for frequency and urgency. Negative for dysuria and hematuria.  Musculoskeletal:  Positive for joint pain. Negative for back pain, falls and myalgias.  Skin:  Negative for rash.  Neurological:  Negative for dizziness, sensory change, loss of consciousness, weakness and headaches.  Endo/Heme/Allergies:  Negative for environmental allergies. Does not bruise/bleed easily.  Psychiatric/Behavioral:  Negative for depression and suicidal ideas. The patient has insomnia. The patient is not nervous/anxious.        Objective:    Physical Exam Constitutional:      General: She is not in acute distress.    Appearance: Normal appearance. She is not diaphoretic.  HENT:     Head: Normocephalic and atraumatic.     Right Ear: Tympanic membrane, ear canal and external ear normal.     Left Ear: Tympanic membrane, ear canal and external ear normal.     Nose: Nose normal.     Mouth/Throat:     Mouth: Mucous membranes are moist.     Pharynx: Oropharynx is clear. No oropharyngeal  exudate.  Eyes:     General: No scleral icterus.       Right eye: No discharge.        Left eye: No discharge.     Conjunctiva/sclera: Conjunctivae normal.     Pupils: Pupils are equal, round, and reactive to light.  Neck:     Thyroid: No thyromegaly.  Cardiovascular:     Rate and Rhythm: Normal rate and regular rhythm.     Heart sounds: Normal heart sounds. No murmur heard. Pulmonary:     Effort: Pulmonary effort is normal. No respiratory distress.     Breath sounds: Normal breath sounds. No wheezing or rales.  Abdominal:     General: Bowel sounds are normal. There is no distension.     Palpations: Abdomen is soft. There is no mass.     Tenderness: There is no abdominal tenderness.  Musculoskeletal:        General: No tenderness. Normal range of motion.     Cervical back: Normal range of motion and neck supple.  Lymphadenopathy:     Cervical: No cervical adenopathy.  Skin:    General: Skin is warm and dry.     Findings: No rash.  Neurological:     General: No focal deficit present.     Mental Status: She is alert and oriented to person, place, and time.     Cranial Nerves: No cranial nerve deficit.     Coordination: Coordination normal.     Deep Tendon Reflexes: Reflexes are normal and symmetric. Reflexes normal.  Psychiatric:        Mood and Affect: Mood normal.        Behavior: Behavior normal.        Thought Content: Thought content normal.        Judgment: Judgment normal.     BP 122/78 (BP Location: Left Arm, Patient Position: Sitting, Cuff Size: Normal)   Pulse 64   Temp 98.1 F (36.7 C) (Oral)   Resp 16   Ht 5\' 6"  (1.676 m)   Wt 141 lb (64 kg)   LMP 03/02/2011   SpO2 99%   BMI 22.76 kg/m  Wt Readings  from Last 3 Encounters:  07/01/22 141 lb (64 kg)  08/17/21 140 lb 12.8 oz (63.9 kg)  06/26/21 143 lb (64.9 kg)    Diabetic Foot Exam - Simple   No data filed    Lab Results  Component Value Date   WBC 4.6 07/01/2022   HGB 12.0 07/01/2022   HCT  37.1 07/01/2022   PLT 253.0 07/01/2022   GLUCOSE 90 07/01/2022   CHOL 161 07/01/2022   TRIG 64.0 07/01/2022   HDL 68.80 07/01/2022   LDLCALC 80 07/01/2022   ALT 19 07/01/2022   AST 22 07/01/2022   NA 143 07/01/2022   K 4.1 07/01/2022   CL 103 07/01/2022   CREATININE 0.78 07/01/2022   BUN 16 07/01/2022   CO2 30 07/01/2022   TSH 1.53 07/01/2022   HGBA1C 5.5 07/01/2022    Lab Results  Component Value Date   TSH 1.53 07/01/2022   Lab Results  Component Value Date   WBC 4.6 07/01/2022   HGB 12.0 07/01/2022   HCT 37.1 07/01/2022   MCV 96.5 07/01/2022   PLT 253.0 07/01/2022   Lab Results  Component Value Date   NA 143 07/01/2022   K 4.1 07/01/2022   CHLORIDE 106 04/08/2016   CO2 30 07/01/2022   GLUCOSE 90 07/01/2022   BUN 16 07/01/2022   CREATININE 0.78 07/01/2022   BILITOT 0.4 07/01/2022   ALKPHOS 43 07/01/2022   AST 22 07/01/2022   ALT 19 07/01/2022   PROT 7.1 07/01/2022   ALBUMIN 4.5 07/01/2022   CALCIUM 10.0 07/01/2022   ANIONGAP 7 04/08/2016   EGFR 79 (L) 04/08/2016   GFR 84.87 07/01/2022   Lab Results  Component Value Date   CHOL 161 07/01/2022   Lab Results  Component Value Date   HDL 68.80 07/01/2022   Lab Results  Component Value Date   LDLCALC 80 07/01/2022   Lab Results  Component Value Date   TRIG 64.0 07/01/2022   Lab Results  Component Value Date   CHOLHDL 2 07/01/2022   Lab Results  Component Value Date   HGBA1C 5.5 07/01/2022       Assessment & Plan:  Osteoporosis, unspecified osteoporosis type, unspecified pathological fracture presence Assessment & Plan: Encouraged to get adequate exercise, calcium and vitamin d intake  Dexa done at [facility] has improved to osteopenia with treatment is stable is on Evista. D3 1000 qod, calcium slow release 1200 mg daily. Check Vitamin D level  Orders: -     VITAMIN D 25 Hydroxy (Vit-D Deficiency, Fractures) -     Lipid panel  Preventative health care Assessment &  Plan: Patient encouraged to maintain heart healthy diet, regular exercise, adequate sleep. Consider daily probiotics. Take medications as prescribed. Labs ordered and reviewed. 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 Colonoscopy- Last completed on 04/19/2021. Polyp was found in the transverse colon and was resected and retrieved. There were small internal hemorrhoids. Repeat in 5 years. Pap smear- Last checked on May 2024 Dr Rana Snare will request records Dexa- Last checked on 2024. Patient was osteopenia has improved  Orders: -     TSH  Hyperglycemia Assessment & Plan: hgba1c acceptable, minimize simple carbs. Increase exercise as tolerated.  Orders: -     Hemoglobin A1c -     Lipid panel  Malignant neoplasm of upper-inner quadrant of left breast in female, estrogen receptor positive (HCC) Assessment & Plan: S/p tamoxifen  and bilateral mastectomy and released from Oncology   Right arm pain Assessment & Plan: Some Tennis elbow right arm after moving boxes while moving. Encouraged moist heat and gentle stretching as tolerated. May try NSAIDs and prescription meds as directed and report if symptoms worsen or seek immediate care consider referral to sports med if persists   Hematuria, unspecified type -     Urinalysis, Routine w reflex microscopic -     Urine Culture -     CBC with Differential/Platelet  Muscle cramps -     Magnesium -     Comprehensive metabolic panel  Skin lesion of face -     Ambulatory referral to Dermatology  Sun-damaged skin -     Ambulatory referral to Dermatology  Insomnia, unspecified type Assessment & Plan: Encouraged good sleep hygiene such as dark, quiet room. No blue/green glowing lights such as computer screens in bedroom. No alcohol or stimulants in evening. Cut down on caffeine as able. Regular exercise is helpful but not just prior to bed time.  Uses small amounts of ambien  prn with adeuate effect.      Assessment and Plan    Insomnia: Reports intermittent use of Ambien, taking a third of a dose as needed. Wakes up at 2 AM after initial sleep onset. -Continue current regimen of Ambien as needed.  Muscle cramps: Reports frequent muscle cramps in feet. Currently taking over-the-counter magnesium every other day. -Check magnesium level today. -Consider switching to magnesium glycinate for potential improved absorption and sleep benefits.  Recurrent UTI symptoms: Reports frequent episodes of symptoms suggestive of UTI, including frequency, burning, and hematuria. Manages symptoms with hydration, Azo, and cranberry juice. -Collect urine sample today for analysis. -Consider prophylactic measures such as cranberry tablets and hydration on long clinical days.  Osteopenia: Stable, no change on recent DEXA scan. Currently on Evista, calcium 1200mg  daily, and Vitamin D3 1000 IU every other day. -Continue current regimen.  Skin lesions: Reports seborrheic keratosis and a persistent red spot on forehead. -Refer to dermatology for evaluation and management.         Danise Edge, MD

## 2022-07-08 NOTE — Assessment & Plan Note (Signed)
Encouraged good sleep hygiene such as dark, quiet room. No blue/green glowing lights such as computer screens in bedroom. No alcohol or stimulants in evening. Cut down on caffeine as able. Regular exercise is helpful but not just prior to bed time.  Uses small amounts of ambien prn with adeuate effect.

## 2022-08-27 ENCOUNTER — Other Ambulatory Visit: Payer: Self-pay | Admitting: Family Medicine

## 2022-08-27 NOTE — Telephone Encounter (Signed)
Requesting: Ambien 10mg   Contract: None UDS: None Last Visit: 07/01/22 Next Visit: 07/03/23 Last Refill: 01/31/22 #15 and 3RF   Please Advise

## 2022-09-11 ENCOUNTER — Other Ambulatory Visit: Payer: Self-pay

## 2022-09-11 ENCOUNTER — Other Ambulatory Visit: Payer: Self-pay | Admitting: Family Medicine

## 2022-09-11 DIAGNOSIS — T7840XD Allergy, unspecified, subsequent encounter: Secondary | ICD-10-CM

## 2022-09-11 MED ORDER — MONTELUKAST SODIUM 10 MG PO TABS
10.0000 mg | ORAL_TABLET | Freq: Every evening | ORAL | 2 refills | Status: DC | PRN
Start: 2022-09-11 — End: 2023-07-22
  Filled 2022-09-11: qty 90, 90d supply, fill #0
  Filled 2023-01-07: qty 90, 90d supply, fill #1
  Filled 2023-04-10: qty 90, 90d supply, fill #2

## 2023-03-28 ENCOUNTER — Other Ambulatory Visit: Payer: Self-pay | Admitting: Family Medicine

## 2023-03-28 NOTE — Telephone Encounter (Signed)
 Requesting:  ZOLPIDEM TARTRATE 10 MG TABLET  Last Visit: 07/01/2022 Next Visit: 07/03/2023 Last Refill: 08/27/2022  Please Advise

## 2023-06-02 LAB — RESULTS CONSOLE HPV: CHL HPV: NEGATIVE

## 2023-06-02 LAB — HM PAP SMEAR

## 2023-06-27 ENCOUNTER — Other Ambulatory Visit: Payer: Self-pay | Admitting: Medical

## 2023-06-27 DIAGNOSIS — J309 Allergic rhinitis, unspecified: Secondary | ICD-10-CM

## 2023-06-29 NOTE — Assessment & Plan Note (Signed)
 Patient encouraged to maintain heart healthy diet, regular exercise, adequate sleep. Consider daily probiotics. Take medications as prescribed. Labs ordered and reviewed. 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 Colonoscopy- Last completed on 04/19/2021. Polyp was found in the transverse colon and was resected and retrieved. There were small internal hemorrhoids. Repeat in 5 years. Pap smear- Last checked on May 2025 Dr Asencion Blacksmith will request records Dexa- Last checked on 2024. Patient was osteopenia has improved MGM- ineligible due to bilateral mastectomy  Given and reviewed copy of ACP documents from [employer] and encouraged to complete and return

## 2023-06-29 NOTE — Assessment & Plan Note (Signed)
 hgba1c acceptable, minimize simple carbs. Increase exercise as tolerated.

## 2023-06-29 NOTE — Assessment & Plan Note (Signed)
 Encouraged to get adequate exercise, calcium and vitamin d  intake  Dexa done at [facility]enc[facility], has improved to osteopenia with treatment is stable is on Evista . D3 1000 qod, calcium slow release 1200 mg daily. Check Vitamin D  level

## 2023-07-03 ENCOUNTER — Ambulatory Visit: Payer: BC Managed Care – PPO | Admitting: Family Medicine

## 2023-07-03 ENCOUNTER — Encounter: Payer: Self-pay | Admitting: Family Medicine

## 2023-07-03 VITALS — BP 118/76 | HR 67 | Resp 16 | Ht 66.0 in | Wt 140.2 lb

## 2023-07-03 DIAGNOSIS — M8580 Other specified disorders of bone density and structure, unspecified site: Secondary | ICD-10-CM

## 2023-07-03 DIAGNOSIS — M810 Age-related osteoporosis without current pathological fracture: Secondary | ICD-10-CM | POA: Diagnosis not present

## 2023-07-03 DIAGNOSIS — Z1501 Genetic susceptibility to malignant neoplasm of breast: Secondary | ICD-10-CM | POA: Diagnosis not present

## 2023-07-03 DIAGNOSIS — G4700 Insomnia, unspecified: Secondary | ICD-10-CM

## 2023-07-03 DIAGNOSIS — Z0000 Encounter for general adult medical examination without abnormal findings: Secondary | ICD-10-CM

## 2023-07-03 DIAGNOSIS — R739 Hyperglycemia, unspecified: Secondary | ICD-10-CM | POA: Diagnosis not present

## 2023-07-03 DIAGNOSIS — Z1509 Genetic susceptibility to other malignant neoplasm: Secondary | ICD-10-CM

## 2023-07-03 DIAGNOSIS — C44311 Basal cell carcinoma of skin of nose: Secondary | ICD-10-CM | POA: Diagnosis not present

## 2023-07-03 LAB — COMPREHENSIVE METABOLIC PANEL WITH GFR
ALT: 20 U/L (ref 0–35)
AST: 21 U/L (ref 0–37)
Albumin: 4.6 g/dL (ref 3.5–5.2)
Alkaline Phosphatase: 46 U/L (ref 39–117)
BUN: 18 mg/dL (ref 6–23)
CO2: 27 meq/L (ref 19–32)
Calcium: 9.8 mg/dL (ref 8.4–10.5)
Chloride: 104 meq/L (ref 96–112)
Creatinine, Ser: 0.75 mg/dL (ref 0.40–1.20)
GFR: 88.34 mL/min (ref >60.00)
Glucose, Bld: 91 mg/dL (ref 70–99)
Potassium: 4 meq/L (ref 3.5–5.1)
Sodium: 141 meq/L (ref 135–145)
Total Bilirubin: 0.5 mg/dL (ref 0.2–1.2)
Total Protein: 7 g/dL (ref 6.0–8.3)

## 2023-07-03 LAB — LIPID PANEL
Cholesterol: 177 mg/dL (ref 0–200)
HDL: 71.5 mg/dL (ref >39.00)
LDL Cholesterol: 90 mg/dL (ref 0–99)
NonHDL: 105.21
Total CHOL/HDL Ratio: 2
Triglycerides: 77 mg/dL (ref 0.0–149.0)
VLDL: 15.4 mg/dL (ref 0.0–40.0)

## 2023-07-03 LAB — CBC WITH DIFFERENTIAL/PLATELET
Basophils Absolute: 0 10*3/uL (ref 0.0–0.1)
Basophils Relative: 0.8 % (ref 0.0–3.0)
Eosinophils Absolute: 0.2 10*3/uL (ref 0.0–0.7)
Eosinophils Relative: 3.4 % (ref 0.0–5.0)
HCT: 39 % (ref 36.0–46.0)
Hemoglobin: 12.9 g/dL (ref 12.0–15.0)
Lymphocytes Relative: 34.3 % (ref 12.0–46.0)
Lymphs Abs: 1.5 10*3/uL (ref 0.7–4.0)
MCHC: 33.2 g/dL (ref 30.0–36.0)
MCV: 94.9 fl (ref 78.0–100.0)
Monocytes Absolute: 0.4 10*3/uL (ref 0.1–1.0)
Monocytes Relative: 8.3 % (ref 3.0–12.0)
Neutro Abs: 2.4 10*3/uL (ref 1.4–7.7)
Neutrophils Relative %: 53.2 % (ref 43.0–77.0)
Platelets: 237 10*3/uL (ref 150.0–400.0)
RBC: 4.11 Mil/uL (ref 3.87–5.11)
RDW: 12.7 % (ref 11.5–15.5)
WBC: 4.5 10*3/uL (ref 4.0–10.5)

## 2023-07-03 LAB — VITAMIN D 25 HYDROXY (VIT D DEFICIENCY, FRACTURES): VITD: 42.79 ng/mL (ref 30.00–100.00)

## 2023-07-03 LAB — TSH: TSH: 1.07 u[IU]/mL (ref 0.35–5.50)

## 2023-07-03 MED ORDER — CITRACAL SLOW RELEASE 600-40-500 MG-MG-UNIT PO TB24: 1.0000 | ORAL_TABLET | Freq: Two times a day (BID) | ORAL | Status: AC

## 2023-07-03 NOTE — Patient Instructions (Signed)
 Tetanus in August of 206 or sooner if injured  Prevnar 20 and RSV, Respiratory Syncitial Virus Vaccine, Arexvy at pharmacy  CBTInsomnia   Preventive Care 37-58 Years Old, Female Preventive care refers to lifestyle choices and visits with your health care provider that can promote health and wellness. Preventive care visits are also called wellness exams. What can I expect for my preventive care visit? Counseling Your health care provider may ask you questions about your: Medical history, including: Past medical problems. Family medical history. Pregnancy history. Current health, including: Menstrual cycle. Method of birth control. Emotional well-being. Home life and relationship well-being. Sexual activity and sexual health. Lifestyle, including: Alcohol, nicotine or tobacco, and drug use. Access to firearms. Diet, exercise, and sleep habits. Work and work Astronomer. Sunscreen use. Safety issues such as seatbelt and bike helmet use. Physical exam Your health care provider will check your: Height and weight. These may be used to calculate your BMI (body mass index). BMI is a measurement that tells if you are at a healthy weight. Waist circumference. This measures the distance around your waistline. This measurement also tells if you are at a healthy weight and may help predict your risk of certain diseases, such as type 2 diabetes and high blood pressure. Heart rate and blood pressure. Body temperature. Skin for abnormal spots. What immunizations do I need?  Vaccines are usually given at various ages, according to a schedule. Your health care provider will recommend vaccines for you based on your age, medical history, and lifestyle or other factors, such as travel or where you work. What tests do I need? Screening Your health care provider may recommend screening tests for certain conditions. This may include: Lipid and cholesterol levels. Diabetes screening. This is done  by checking your blood sugar (glucose) after you have not eaten for a while (fasting). Pelvic exam and Pap test. Hepatitis B test. Hepatitis C test. HIV (human immunodeficiency virus) test. STI (sexually transmitted infection) testing, if you are at risk. Lung cancer screening. Colorectal cancer screening. Mammogram. Talk with your health care provider about when you should start having regular mammograms. This may depend on whether you have a family history of breast cancer. BRCA-related cancer screening. This may be done if you have a family history of breast, ovarian, tubal, or peritoneal cancers. Bone density scan. This is done to screen for osteoporosis. Talk with your health care provider about your test results, treatment options, and if necessary, the need for more tests. Follow these instructions at home: Eating and drinking  Eat a diet that includes fresh fruits and vegetables, whole grains, lean protein, and low-fat dairy products. Take vitamin and mineral supplements as recommended by your health care provider. Do not drink alcohol if: Your health care provider tells you not to drink. You are pregnant, may be pregnant, or are planning to become pregnant. If you drink alcohol: Limit how much you have to 0-1 drink a day. Know how much alcohol is in your drink. In the [place] one drink equals one 12 oz bottle of beer (355 mL), one 5 oz glass of wine (148 mL), or one 1 oz glass of hard liquor (44 mL). Lifestyle Brush your teeth every morning and night with fluoride toothpaste. Floss one time each day. Exercise for at least 30 minutes 5 or more days each week. Do not use any products that contain nicotine or tobacco. These products include cigarettes, chewing tobacco, and vaping devices, such as e-cigarettes. If you need help quitting, ask  your health care provider. Do not use drugs. If you are sexually active, practice safe sex. Use a condom or other form of protection to prevent  STIs. If you do not wish to become pregnant, use a form of birth control. If you plan to become pregnant, see your health care provider for a prepregnancy visit. Take aspirin only as told by your health care provider. Make sure that you understand how much to take and what form to take. Work with your health care provider to find out whether it is safe and beneficial for you to take aspirin daily. Find healthy ways to manage stress, such as: Meditation, yoga, or listening to music. Journaling. Talking to a trusted person. Spending time with friends and family. Minimize exposure to UV radiation to reduce your risk of skin cancer. Safety Always wear your seat belt while driving or riding in a vehicle. Do not drive: If you have been drinking alcohol. Do not ride with someone who has been drinking. When you are tired or distracted. While texting. If you have been using any mind-altering substances or drugs. Wear a helmet and other protective equipment during sports activities. If you have firearms in your house, make sure you follow all gun safety procedures. Seek help if you have been physically or sexually abused. What's next? Visit your health care provider once a year for an annual wellness visit. Ask your health care provider how often you should have your eyes and teeth checked. Stay up to date on all vaccines. This information is not intended to replace advice given to you by your health care provider. Make sure you discuss any questions you have with your health care provider. Document Revised: 07/12/2020 Document Reviewed: 07/12/2020 Elsevier Patient Education  2024 [employer].

## 2023-07-03 NOTE — Assessment & Plan Note (Signed)
 From her biologic father's gene pool most likely. All of the males have prostate cancer

## 2023-07-03 NOTE — Assessment & Plan Note (Signed)
 S/P Mohs doing well

## 2023-07-03 NOTE — Progress Notes (Signed)
 Subjective:    Patient ID: Abigail [name], [name]    DOB: September 23, 1965, 58 y.o.   MRN: [id number]  Chief Complaint  Patient presents with   Annual Exam    Patient presents today for a physical exam.    HPI Discussed the use of AI scribe software for clinical note transcription with the patient, who gave verbal consent to proceed.  History of Present Illness Abigail [name] who presents for a routine follow-up visit.  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 The procedure was performed within the [facility]. The lesion was deeper than anticipated, necessitating the referral for Mohs surgery. No plastic surgery was required post-procedure.  She has a history of osteopenia and takes Citracal slow release calcium citrate, one tablet twice daily, which has improved her bone density. Due to insurance changes, she requires a doctor's note to purchase calcium with her benefits card.  Her family history is significant for her biological father having aggressive multiple myeloma and prostate cancer. She has a BRCA gene mutation, discovered through genetic testing, which prompted her to seek out her biological family history.  She experiences sleep disturbances and occasionally uses Ambien , though she tries to avoid regular use. She is interested in exploring cognitive behavioral therapy for insomnia (CBT-I) as a potential treatment.  She has not completed her advanced directives or healthcare power of attorney but plans to do so, with her daughter as the secondary healthcare proxy. She works at [employer] and has a daughter who is a Engineer, civil (consulting). No recent emergency room visits, chest pain, or significant changes in bowel habits.    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   Chicken pox as a child   History of mononucleosis    History of streptococcal pharyngitis    Hot flashes    Insomnia 02/11/2011   Osteoporosis    and osteopenia on Evista    Post-operative nausea and vomiting    past c-section   Preventative health care 12/04/2010   S/P chemotherapy, time since less than 4 weeks 05/23/2011   completed 4 doses docetaxel /cyclophosphamide    Urticaria    UTI (lower urinary tract infection) 11/28/2011   Vaginitis 10/08/2011    Past Surgical History:  Procedure Laterality Date   BREAST RECONSTRUCTION Bilateral 03/24/2012   Procedure: BREAST RECONSTRUCTION WITH PLACEMENT OF SILICONE GEL IMPLANTS;  Surgeon: Elidia Grout, MD;  Location: [facility] OR;  Service: Plastics;  Laterality: Bilateral;   CESAREAN SECTION  1999   X 1   COLONOSCOPY     MASTECTOMY W/ SENTINEL NODE BIOPSY  08/07/2011   Procedure: MASTECTOMY WITH SENTINEL LYMPH NODE BIOPSY;  Surgeon: Mayme Spearman, MD;  Location: [facility] OR;  Service: General;  Laterality: Bilateral;  bilateral mastectomy   MOHS SURGERY  2025   nose   MOUTH SURGERY  1997   gum surgery   POLYPECTOMY     TA x 2 2017   PORT-A-CATH REMOVAL  08/07/2011   Procedure: REMOVAL PORT-A-CATH;  Surgeon: Mayme Spearman, MD;  Location: [facility] OR;  Service: General;  Laterality: Right;   PORTACATH PLACEMENT  03/20/2011   Procedure: INSERTION PORT-A-CATH;  Surgeon: Mayme Spearman, MD;  Location: [facility];  Service: General;  Laterality: Right;  placement of port -Right Subclavian Vein   TISSUE EXPANDER PLACEMENT  08/07/2011   Procedure: TISSUE EXPANDER;  Surgeon: Elidia Grout, MD;  Location: [facility] OR;  Service: Plastics;  Laterality: Bilateral;  BILATERAL PLACEMENT OF TISSUE EXPANDERS FOR BREAST  RECONSTRUCTION   TUBAL LIGATION     1999   VARICOSE VEIN SURGERY     [facility]    Family History  Adopted: Yes  Problem Relation Age of Onset   Heart disease Mother    Cancer Father        multiple myeloma. prostate cancer   Heart disease Maternal Uncle    Other Maternal Grandmother        mad cow disease   Cancer Other        prostate cancer, diffuse in uncles, GF   Colon cancer Neg Hx    Colon polyps Neg Hx    Esophageal cancer Neg Hx    Rectal cancer Neg Hx    Stomach cancer Neg Hx     Social History   Socioeconomic History   Marital status: Married    Spouse name: Not on file   Number of children: 2   Years of education: Not on file   Highest education level: Not on file  Occupational History    Employer: [employer]   Occupation: [employer]    Employer: [employer]  Tobacco Use   Smoking status: Never   Smokeless tobacco: Never  Substance and Sexual Activity   Alcohol use: Yes    Comment: occasionally- social  2 drinks   Drug use: No   Sexual activity: Yes    Birth control/protection: Surgical  Other Topics Concern   Not on file  Social History Narrative   Not on file   Social Drivers of Health   Financial Resource Strain: Not on file  Food Insecurity: Not on file  Transportation Needs: Not on file  Physical Activity: Not on file  Stress: Not on file  Social Connections: Not on file  Intimate Partner Violence: Not on file    Outpatient Medications Prior to Visit  Medication Sig Dispense Refill   ASPIRIN 81 PO Take 81 mg by mouth every other day.     Calcium Carb-Cholecalciferol (CALCIUM + VITAMIN D3 PO) Take 1,200 mg by mouth daily.     Calcium Citrate-Vitamin D3 (CALCITRATE) 315-6.25 MG-MCG TABS      Cholecalciferol (VITAMIN D -3 PO) Every other day     estradiol  (ESTRACE ) 0.1 MG/GM vaginal cream Insert 0.5 gram twice a week by vaginal route. 42.5 g 4   fluticasone (FLONASE) 50 MCG/ACT nasal spray Place  into both nostrils as needed for allergies or rhinitis.     levocetirizine (XYZAL ) 5 MG tablet TAKE 1 TABLET BY MOUTH EVERY DAY IN THE MORNING 90 tablet 3   loratadine (CLARITIN) 10 MG tablet Take 10 mg by mouth daily as needed.     Magnesium 200 MG TABS Take by oral route.     montelukast  (SINGULAIR ) 10 MG tablet Take 1 tablet (10 mg total) by mouth at bedtime as needed. 90 tablet 2   raloxifene  (EVISTA ) 60 MG tablet TAKE 1 TABLET BY MOUTH ONCE DAILY 90 tablet 4   zolpidem  (AMBIEN ) 10 MG tablet TAKE  1 TABLET BY MOUTH EVERY NIGHT AT BEDTIME AS NEEDED 15 tablet 1   No facility-administered medications prior to visit.    Allergies  Allergen Reactions   Buspar  [Buspirone ] Itching   Lexapro  [Escitalopram ] Itching    Itching, dry mouth, constipation   Wellbutrin  [Bupropion ] Itching    Dizzy and itchy   Penicillins Rash   Prozac  [Fluoxetine ] Anxiety    Anxiety, itching   Zoloft  [Sertraline ] Anxiety    Anxiety, itching    Review of Systems  Constitutional:  Negative for chills, fever and malaise/fatigue.  HENT:  Negative for congestion and hearing loss.   Eyes:  Negative for discharge.  Respiratory:  Negative for cough, sputum production and shortness of breath.   Cardiovascular:  Negative for chest pain, palpitations and leg swelling.  Gastrointestinal:  Negative for abdominal pain, blood in stool, constipation, diarrhea, heartburn, nausea and vomiting.  Genitourinary:  Negative for dysuria, frequency, hematuria and urgency.  Musculoskeletal:  Negative for back pain, falls and myalgias.  Skin:  Negative for rash.  Neurological:  Negative for dizziness, sensory change, loss of consciousness, weakness and headaches.  Endo/Heme/Allergies:  Negative for environmental allergies. Does not bruise/bleed easily.  Psychiatric/Behavioral:  Negative for depression and suicidal ideas. The patient is not nervous/anxious and does not have insomnia.        Objective:     Physical  Exam Constitutional:      General: She is not in acute distress.    Appearance: Normal appearance. She is not diaphoretic.  HENT:     Head: Normocephalic and atraumatic.     Right Ear: Tympanic membrane, ear canal and external ear normal.     Left Ear: Tympanic membrane, ear canal and external ear normal.     Nose: Nose normal.     Mouth/Throat:     Mouth: Mucous membranes are moist.     Pharynx: Oropharynx is clear. No oropharyngeal exudate.  Eyes:     General: No scleral icterus.       Right eye: No discharge.        Left eye: No discharge.     Conjunctiva/sclera: Conjunctivae normal.     Pupils: Pupils are equal, round, and reactive to light.  Neck:     Thyroid : No thyromegaly.  Cardiovascular:     Rate and Rhythm: Normal rate and regular rhythm.     Heart sounds: Normal heart sounds. No murmur heard. Pulmonary:     Effort: Pulmonary effort is normal. No respiratory distress.     Breath sounds: Normal breath sounds. No wheezing or rales.  Abdominal:     General: Bowel sounds are normal. There is no distension.     Palpations: Abdomen is soft. There is no mass.     Tenderness: There is no abdominal tenderness.  Musculoskeletal:        General: No tenderness. Normal range of motion.     Cervical back: Normal range of motion and neck supple.  Lymphadenopathy:     Cervical: No cervical adenopathy.  Skin:    General: Skin is warm and dry.     Findings: No rash.  Neurological:     General: No focal deficit present.     Mental Status: She is alert and oriented to person, place, and time.     Cranial Nerves: No cranial nerve deficit.     Coordination: Coordination normal.     Deep Tendon Reflexes: Reflexes are normal and symmetric. Reflexes normal.  Psychiatric:  Mood and Affect: Mood normal.        Behavior: Behavior normal.        Thought Content: Thought content normal.        Judgment: Judgment normal.     BP 118/76   Pulse 67   Resp 16   Ht 5\' 6"  (1.676 m)    Wt 140 lb 3.2 oz (63.6 kg)   LMP 03/02/2011   SpO2 98%   BMI 22.63 kg/m  Wt Readings from Last 3 Encounters:  07/03/23 140 lb 3.2 oz (63.6 kg)  07/01/22 141 lb (64 kg)  08/17/21 140 lb 12.8 oz (63.9 kg)    Diabetic Foot Exam - Simple   No data filed    Lab Results  Component Value Date   WBC 4.6 07/01/2022   HGB 12.0 07/01/2022   HCT 37.1 07/01/2022   PLT 253.0 07/01/2022   GLUCOSE 90 07/01/2022   CHOL 161 07/01/2022   TRIG 64.0 07/01/2022   HDL 68.80 07/01/2022   LDLCALC 80 07/01/2022   ALT 19 07/01/2022   AST 22 07/01/2022   NA 143 07/01/2022   K 4.1 07/01/2022   CL 103 07/01/2022   CREATININE 0.78 07/01/2022   BUN 16 07/01/2022   CO2 30 07/01/2022   TSH 1.53 07/01/2022   HGBA1C 5.5 07/01/2022    Lab Results  Component Value Date   TSH 1.53 07/01/2022   Lab Results  Component Value Date   WBC 4.6 07/01/2022   HGB 12.0 07/01/2022   HCT 37.1 07/01/2022   MCV 96.5 07/01/2022   PLT 253.0 07/01/2022   Lab Results  Component Value Date   NA 143 07/01/2022   K 4.1 07/01/2022   CHLORIDE 106 04/08/2016   CO2 30 07/01/2022   GLUCOSE 90 07/01/2022   BUN 16 07/01/2022   CREATININE 0.78 07/01/2022   BILITOT 0.4 07/01/2022   ALKPHOS 43 07/01/2022   AST 22 07/01/2022   ALT 19 07/01/2022   PROT 7.1 07/01/2022   ALBUMIN 4.5 07/01/2022   CALCIUM 10.0 07/01/2022   ANIONGAP 7 04/08/2016   EGFR 79 (L) 04/08/2016   GFR 84.87 07/01/2022   Lab Results  Component Value Date   CHOL 161 07/01/2022   Lab Results  Component Value Date   HDL 68.80 07/01/2022   Lab Results  Component Value Date   LDLCALC 80 07/01/2022   Lab Results  Component Value Date   TRIG 64.0 07/01/2022   Lab Results  Component Value Date   CHOLHDL 2 07/01/2022   Lab Results  Component Value Date   HGBA1C 5.5 07/01/2022       Assessment & Plan:  Hyperglycemia Assessment & Plan: hgba1c acceptable, minimize simple carbs. Increase exercise as tolerated.  Orders: -      Lipid panel -     Comprehensive metabolic panel with GFR -     CBC with Differential/Platelet -     TSH  Osteoporosis, unspecified osteoporosis type, unspecified pathological fracture presence Assessment & Plan: Encouraged to get adequate exercise, calcium and vitamin d  intake  Dexa done at [facility], has improved to osteopenia with treatment is stable is on Evista . D3 1000 qod, calcium slow release 1200 mg daily. Check Vitamin D  level  Orders: -     VITAMIN D  25 Hydroxy (Vit-D Deficiency, Fractures)  Preventative health care Assessment & Plan: Patient encouraged to maintain heart healthy diet, regular exercise, adequate sleep. Consider daily probiotics. Take medications as prescribed. Labs ordered and  reviewed. 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 Colonoscopy- Last completed on 04/19/2021. Polyp was found in the transverse colon and was resected and retrieved. There were small internal hemorrhoids. Repeat in 5 years. Pap smear- Last checked on May 2025 Dr Asencion Blacksmith will request records Dexa- Last checked on 2024. Patient was osteopenia has improved MGM- ineligible due to bilateral mastectomy  Given and reviewed copy of ACP documents from [facility] Secretary of State and encouraged to complete and return    Other orders -     Citracal Slow Release; Take 1 tablet by mouth 2 (two) times daily. osteopenia    Assessment and Plan Assessment & Plan Skin cancer (post-Mohs surgery) Skin cancer on her nose was excised via Mohs surgery in September 2024 with successful outcomes. Emphasized specialist care for optimal results.  Insomnia She uses Ambien  cautiously due to addiction concerns. Discussed CBT-I as a superior non-pharmacological option. - Research CBT-I resources for insomnia management.  Osteopenia Osteopenia managed with Citracal slow release, improving bone density. Insurance requires a  doctor's note for calcium purchase. - Provide letter for over-the-counter calcium supplementation. - Document Citracal slow release, one tablet twice daily, in her chart.  General Health Maintenance Discussed routine health maintenance, vaccinations, and wellness. Tetanus booster due in 2026. Future availability of Prevnar 20 and RSV vaccines pending insurance. - Order basic metabolic panel, complete blood count, thyroid  function tests, and vitamin D  level. - Administer tetanus booster in August 2026 or sooner if injured. - Advocate for flu and COVID boosters in the fall. - Monitor insurance coverage for Prevnar 20 and RSV vaccines.  Goals of Care Provided information on advanced directives and healthcare power of attorney. She plans to designate her husband as primary proxy and daughter as secondary. - Complete advanced directives and healthcare power of attorney documentation.  Follow-up Routine follow-up planned to monitor health and address issues. Lab work to assess health parameters. - Schedule follow-up appointment in one year or as needed. - Conduct lab work today and address any unexpected findings.      Randie Bustle, MD

## 2023-07-05 ENCOUNTER — Ambulatory Visit: Payer: Self-pay | Admitting: Family Medicine

## 2023-07-07 ENCOUNTER — Encounter: Payer: Self-pay | Admitting: Family Medicine

## 2023-07-22 ENCOUNTER — Other Ambulatory Visit: Payer: Self-pay | Admitting: Family Medicine

## 2023-07-22 DIAGNOSIS — T7840XD Allergy, unspecified, subsequent encounter: Secondary | ICD-10-CM

## 2023-07-22 MED ORDER — MONTELUKAST SODIUM 10 MG PO TABS: 10.0000 mg | ORAL_TABLET | Freq: Every evening | ORAL | 1 refills | Status: DC | PRN

## 2023-09-19 ENCOUNTER — Ambulatory Visit: Admitting: Student

## 2023-09-19 ENCOUNTER — Encounter: Payer: Self-pay | Admitting: Student

## 2023-09-19 VITALS — BP 128/70 | HR 73 | Temp 98.3°F | Resp 12 | Ht 66.0 in | Wt 142.2 lb

## 2023-09-19 DIAGNOSIS — J339 Nasal polyp, unspecified: Secondary | ICD-10-CM

## 2023-09-19 NOTE — Progress Notes (Signed)
   Acute Office Visit  Subjective:     Patient ID: Abigail [name], female    DOB: 1965/05/25, 58 y.o.   MRN: [id number]  Chief Complaint  Patient presents with   MASS INSIDE RIGHT NOSTRIL    HPI [name] is a 58 year old female patient is in today for acute visit with c/o  nose mass, right nare.   Patient first noticed a polyp in the right nare in May 2024. Since June 2025, she reports the polyp has progressively enlarged and feels it has nearly doubled in size over the past few weeks. She denies pain, epistaxis, or changes in smell or taste. +clear rhinorrhea.  Past medical history significant for allergies.  Patient reports she has not been using Flonase nasal spray regularly, but has been using Xyzal , Singulair , Claritin as prescribed.  Patient denies fever, chills, SOB, CP, palpitations, dyspnea, edema, HA, vision changes, N/V/D, abdominal pain, urinary symptoms, rash, weight changes, and recent illness or hospitalizations.   ROS  See HPI    Objective:    BP 128/70 (BP Location: Right Arm, Patient Position: Sitting, Cuff Size: Normal)   Pulse 73   Temp 98.3 F (36.8 C) (Oral)   Resp 12   Ht 5' 6 (1.676 m)   Wt 142 lb 3.2 oz (64.5 kg)   LMP 03/02/2011   SpO2 100%   BMI 22.95 kg/m    Physical Exam  General: No acute distress. Awake and conversant.  Eyes: Normal conjunctiva, anicteric. Round symmetric pupils.  ENT: Hearing grossly intact. +pink nasal poly right nare Neck: Neck is supple. No masses or thyromegaly.  Respiratory: CTAB. Respirations are non-labored. No wheezing.  Skin: Warm. No rashes or ulcers.  Psych: Alert and oriented. Cooperative, Appropriate mood and affect, Normal judgment.  CV: RRR. No murmur. No lower extremity edema.  MSK: Normal ambulation. No clubbing or cyanosis.  Neuro:  CN II-XII grossly normal.     No results found for any visits on 09/19/23.      Assessment & Plan:   Problem List Items Addressed This Visit    None Visit Diagnoses       Nasal polyp    -  Primary   Relevant Orders   Ambulatory referral to ENT     Referral to ENT for further evaluation.  Encouraged use of Flonase daily.  Continue allergy medications as prescribed.  Portions of this note were dictated using DRAGON voice recognition software. Please disregard any errors in transcription.    No orders of the defined types were placed in this encounter.   No follow-ups on file.  Brinklee Cisse L Jamica Woodyard, NP

## 2023-10-10 ENCOUNTER — Other Ambulatory Visit: Payer: Self-pay | Admitting: Otolaryngology

## 2023-10-10 DIAGNOSIS — J3489 Other specified disorders of nose and nasal sinuses: Secondary | ICD-10-CM

## 2023-10-17 ENCOUNTER — Inpatient Hospital Stay: Admission: RE | Admit: 2023-10-17 | Source: Ambulatory Visit

## 2023-10-17 ENCOUNTER — Ambulatory Visit
Admission: RE | Admit: 2023-10-17 | Discharge: 2023-10-17 | Disposition: A | Discharge status: Home / Self Care | Source: Ambulatory Visit | Attending: Otolaryngology | Admitting: Otolaryngology

## 2023-10-17 ENCOUNTER — Encounter: Payer: Self-pay | Admitting: Radiology

## 2023-10-17 DIAGNOSIS — J3489 Other specified disorders of nose and nasal sinuses: Secondary | ICD-10-CM

## 2023-10-17 MED ORDER — IOPAMIDOL (ISOVUE-300) INJECTION 61%
75.0000 mL | Freq: Once | INTRAVENOUS | Status: AC | PRN
  Administered 2023-10-17: 75 mL via INTRAVENOUS

## 2023-11-20 ENCOUNTER — Encounter (INDEPENDENT_AMBULATORY_CARE_PROVIDER_SITE_OTHER): Payer: Self-pay

## 2023-12-12 ENCOUNTER — Encounter (INDEPENDENT_AMBULATORY_CARE_PROVIDER_SITE_OTHER): Payer: Self-pay

## 2023-12-12 ENCOUNTER — Ambulatory Visit (INDEPENDENT_AMBULATORY_CARE_PROVIDER_SITE_OTHER)

## 2023-12-12 VITALS — BP 102/66 | HR 80 | Temp 97.9°F | Ht 66.0 in | Wt 140.0 lb

## 2023-12-12 DIAGNOSIS — D2339 Other benign neoplasm of skin of other parts of face: Secondary | ICD-10-CM

## 2023-12-12 DIAGNOSIS — D2330 Other benign neoplasm of skin of unspecified part of face: Secondary | ICD-10-CM

## 2023-12-12 NOTE — Progress Notes (Signed)
 HPI:   Discussed the use of AI scribe software for clinical note transcription with the patient, who gave verbal consent to proceed.  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  Approximately two years ago, she noticed a shiny area in her nose with increased drainage on the right side, initially diagnosed as a nasal polyp. This summer, she observed the growth extending under the skin, noticeable while applying makeup. A CT scan revealed a soft tissue growth consistent with a dermoid cyst.  She reports no additional nasal drainage and no history of nasal surgeries. A scope examination performed by the previous ENT she was referred to showed no involvement of the sinuses. No changes in the size of the growth since summer and no issues with breathing through her nose. No shift in her teeth or loose teeth.  She takes an 81 mg aspirin every other day for mild fluttering but reduced the frequency due to epistaxis. She has a penicillin allergy.    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   Chicken pox as a child   History of mononucleosis    History of streptococcal pharyngitis    Hot flashes    Insomnia 02/11/2011   Osteoporosis    and osteopenia on Evista    Post-operative nausea and vomiting    past c-section   Preventative health care 12/04/2010   S/P chemotherapy, time since less than 4 weeks 05/23/2011   completed 4 doses docetaxel /cyclophosphamide    Urticaria    UTI (lower urinary tract infection)  11/28/2011   Vaginitis 10/08/2011   Past Surgical History:  Procedure Laterality Date   BREAST RECONSTRUCTION Bilateral 03/24/2012   Procedure: BREAST RECONSTRUCTION WITH PLACEMENT OF SILICONE GEL IMPLANTS;  Surgeon: Alm Sick, MD;  Location: [facility] OR;  Service: Plastics;  Laterality: Bilateral;   CESAREAN SECTION  1999   X 1   COLONOSCOPY     MASTECTOMY W/ SENTINEL NODE BIOPSY  08/07/2011   Procedure: MASTECTOMY WITH SENTINEL LYMPH NODE BIOPSY;  Surgeon: Deward GORMAN Curvin DOUGLAS, MD;  Location: [facility] OR;  Service: General;  Laterality: Bilateral;  bilateral mastectomy   MOHS SURGERY  2025   nose   MOUTH SURGERY  1997   gum surgery   POLYPECTOMY     TA x 2 2017   PORT-A-CATH REMOVAL  08/07/2011   Procedure: REMOVAL PORT-A-CATH;  Surgeon: Deward GORMAN Curvin DOUGLAS, MD;  Location: [facility] OR;  Service: General;  Laterality: Right;   PORTACATH PLACEMENT  03/20/2011   Procedure: INSERTION PORT-A-CATH;  Surgeon: Deward GORMAN Curvin DOUGLAS, MD;  Location: [facility];  Service: General;  Laterality: Right;  placement of port -Right Subclavian Vein   TISSUE EXPANDER PLACEMENT  08/07/2011   Procedure: TISSUE EXPANDER;  Surgeon: Alm Sick, MD;  Location: [facility] OR;  Service: Plastics;  Laterality: Bilateral;  BILATERAL PLACEMENT OF TISSUE EXPANDERS FOR BREAST RECONSTRUCTION   TUBAL LIGATION     1999   VARICOSE VEIN SURGERY     [facility]   No  family history of bleeding disorders, wound healing problems or difficulty with anesthesia.  Social Connections: Not on file    Current Outpatient Medications:    ASPIRIN 81 PO, Take 81 mg by mouth every other day., Disp: , Rfl:    Calcium Carb-Cholecalciferol (CALCIUM + VITAMIN D3 PO), Take 1,200 mg by mouth daily., Disp: , Rfl:    estradiol  (ESTRACE ) 0.1 MG/GM vaginal cream, Insert 0.5 gram twice a week by vaginal route., Disp: 42.5 g, Rfl: 4   fluticasone (FLONASE) 50 MCG/ACT nasal spray, Place into both nostrils as needed for allergies or rhinitis., Disp:  , Rfl:    levocetirizine (XYZAL ) 5 MG tablet, TAKE 1 TABLET BY MOUTH EVERY DAY IN THE MORNING, Disp: 90 tablet, Rfl: 3   loratadine (CLARITIN) 10 MG tablet, Take 10 mg by mouth daily as needed., Disp: , Rfl:    Magnesium 200 MG TABS, Take by oral route., Disp: , Rfl:    montelukast  (SINGULAIR ) 10 MG tablet, Take 1 tablet (10 mg total) by mouth at bedtime as needed., Disp: 90 tablet, Rfl: 1   raloxifene  (EVISTA ) 60 MG tablet, TAKE 1 TABLET BY MOUTH ONCE DAILY, Disp: 90 tablet, Rfl: 4   zolpidem  (AMBIEN ) 10 MG tablet, TAKE 1 TABLET BY MOUTH EVERY NIGHT AT BEDTIME AS NEEDED, Disp: 15 tablet, Rfl: 1   Calcium Citrate-Vitamin D3 (CALCITRATE) 315-6.25 MG-MCG TABS, , Disp: , Rfl:    Calcium-Magnesium-Vitamin D  (CITRACAL CALCIUM+D) 600-40-500 MG-MG-UNIT TB24, Take 1 tablet by mouth 2 (two) times daily. osteopenia (Patient not taking: Reported on 12/12/2023), Disp: , Rfl:    Cholecalciferol (VITAMIN D -3 PO), Every other day (Patient not taking: Reported on 12/12/2023), Disp: , Rfl:  A complete ROS was performed with pertinent positives/negatives noted in the HPI. The remainder of the ROS are negative.   Physical Exam:  BP 102/66 (BP Location: Right Arm, Patient Position: Sitting, Cuff Size: Normal)   Pulse 80   Temp 97.9 F (36.6 C)   Ht 5' 6 (1.676 m)   Wt 140 lb (63.5 kg)   LMP 03/02/2011   SpO2 98%   BMI 22.60 kg/m  General: Well developed, well nourished. No acute distress. Head/Face: Normocephalic. No sinus tenderness. Facial nerve intact and equal bilaterally. No facial lacerations. Eyes: PERRL, no scleral icterus or conjunctival hemorrhage. EOMI. Ears: No gross deformity. Normal external canal. Cerumen impaction bilaterally Hearing: Normal speech reception.  Nose: Right nasolabial fold blunted at the alar-cheek junction. Palpable soft tissue mass that is soft and without any overlying skin changes. Elevation of the nasal mucosa into the nasal cavity is noted. Mass is approximately 2 cm x 2  cm. No purulent discharge.  Right inferior turbinate visualization obscured by mass. No left turbinate hypertrophy. Mouth/Oropharynx: Lips without any lesions. Dentition good. No mucosal lesions within the oropharynx. No tonsillar enlargement, exudate, or lesions. Pharyngeal walls symmetrical. Uvula midline. Tongue midline without lesions. Larynx: See TFL if applicable Nasopharynx: See TFL if applicable Neck: Trachea midline. No masses. No thyromegaly or nodules palpated. No crepitus. Lymphatic: No lymphadenopathy in the neck. Respiratory: No stridor or distress. Room air. Cardiovascular: Regular rate and rhythm. Extremities: No edema or cyanosis. Warm and well-perfused. Skin: No scars or lesions on face or neck. Neurologic: CN II-XII grossly intact. Moving all extremities without gross abnormality. Other:  Independent Review of Additional Tests or Records: CT maxillofacial 10/17/2023 - anterior to the nasal aperture is an ovoid soft tissue mass extending medially to partially obstruct the nasal cavity, no bony destruction noted  Procedures: None Impression & Plans: Assessment & Plan Dermoid cyst of right nasolabial fold Dermoid cyst confirmed by CT scan. Surgical removal recommended. - Reviewed CT scan - Discussed surgical intervention with patient. Discussed risks, benefits, and alternatives. Patient would like to proceed with surgery after mid-December due to her job. - Will plan to schedule surgery after December 15 to accommodate patient schedule  Follow-up with Dr. Anice post-op  Adah Malkin, DO [facility]

## 2024-01-23 ENCOUNTER — Other Ambulatory Visit: Payer: Self-pay | Admitting: Family Medicine

## 2024-01-23 DIAGNOSIS — T7840XD Allergy, unspecified, subsequent encounter: Secondary | ICD-10-CM

## 2024-03-04 ENCOUNTER — Encounter (INDEPENDENT_AMBULATORY_CARE_PROVIDER_SITE_OTHER): Payer: Self-pay

## 2024-03-04 ENCOUNTER — Ambulatory Visit (INDEPENDENT_AMBULATORY_CARE_PROVIDER_SITE_OTHER)

## 2024-03-04 VITALS — BP 97/63 | HR 78 | Wt 140.0 lb

## 2024-03-04 DIAGNOSIS — D2330 Other benign neoplasm of skin of unspecified part of face: Secondary | ICD-10-CM

## 2024-03-04 NOTE — Progress Notes (Signed)
 HPI:   Discussed the use of AI scribe software for clinical note transcription with the patient, who gave verbal consent to proceed.  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  Approximately two years ago, she noticed a shiny area in her nose with increased drainage on the right side, initially diagnosed as a nasal polyp. This summer, she observed the growth extending under the skin, noticeable while applying makeup. A CT scan revealed a soft tissue growth consistent with a dermoid cyst.  She reports no additional nasal drainage and no history of nasal surgeries. A scope examination performed by the previous ENT she was referred to showed no involvement of the sinuses. No changes in the size of the growth since summer and no issues with breathing through her nose. No shift in her teeth or loose teeth.  She takes an 81 mg aspirin every other day for mild fluttering but reduced the frequency due to epistaxis. She has a penicillin allergy.  Interval hx 03/04/24  Abigail [name] is a 59 year old female with a history of skin cancer who presents for evaluation of a persistent right oral cavity cyst.  Oral Cavity Cyst: - Persistent cyst in the right oral cavity present for approximately two years - Gradual enlargement with extension into surrounding tissue - Lesion is palpable, can be isolated by pinching, and has intermittently drained - First noticed expansion while applying makeup over the summer - Multiple visits to primary care provider due to lack of resolution - Referred for specialist management - Prior ENT evaluation included endoscopic assessment - Considering intraoral excision to avoid facial scarring - Requests excised cyst be sent for pathology due to personal and strong family history of cancer  Perioperative Considerations: - History of general anesthesia with notable postoperative  pruritus - Allergic to penicillins; will require alternative perioperative antibiotics - History of gum replacement surgery  Cardiac Symptoms and Aspirin Use: - Takes aspirin 81 mg every other day for palpitations - Discontinued aspirin several days prior to visit due to prior episodes of epistaxis with daily dosing - Previously wore Holter monitor for evaluation of intermittent palpitations - Advised to continue aspirin therapy  Cancer History: - History of skin cancer - History of double mastectomy - Strong family history of cancer  Here for preoperative evaluation by me.   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   Chicken pox as a child   History of mononucleosis    History of streptococcal pharyngitis    Hot flashes    Insomnia 02/11/2011   Osteoporosis    and osteopenia on Evista    Post-operative nausea and vomiting    past c-section   Preventative health care 12/04/2010   S/P chemotherapy, time since less than 4 weeks 05/23/2011   completed 4 doses docetaxel /cyclophosphamide    Urticaria    UTI (lower urinary tract infection) 11/28/2011   Vaginitis 10/08/2011   Past Surgical History:  Procedure Laterality Date   BREAST RECONSTRUCTION Bilateral 03/24/2012   Procedure: BREAST RECONSTRUCTION WITH  PLACEMENT OF SILICONE GEL IMPLANTS;  Surgeon: Alm Sick, MD;  Location: [facility] OR;  Service: Plastics;  Laterality: Bilateral;   CESAREAN SECTION  1999   X 1   COLONOSCOPY     MASTECTOMY W/ SENTINEL NODE BIOPSY  08/07/2011   Procedure: MASTECTOMY WITH SENTINEL LYMPH NODE BIOPSY;   Surgeon: Deward GORMAN Curvin DOUGLAS, MD;  Location: [facility] OR;  Service: General;  Laterality: Bilateral;  bilateral mastectomy   MOHS SURGERY  2025   nose   MOUTH SURGERY  1997   gum surgery   POLYPECTOMY     TA x 2 2017   PORT-A-CATH REMOVAL  08/07/2011   Procedure: REMOVAL PORT-A-CATH;  Surgeon: Deward GORMAN Curvin DOUGLAS, MD;  Location: [facility] OR;  Service: General;  Laterality: Right;   PORTACATH PLACEMENT  03/20/2011   Procedure: INSERTION PORT-A-CATH;  Surgeon: Deward GORMAN Curvin DOUGLAS, MD;  Location: [facility];  Service: General;  Laterality: Right;  placement of port -Right Subclavian Vein   TISSUE EXPANDER PLACEMENT  08/07/2011   Procedure: TISSUE EXPANDER;  Surgeon: Alm Sick, MD;  Location: [facility] OR;  Service: Plastics;  Laterality: Bilateral;  BILATERAL PLACEMENT OF TISSUE EXPANDERS FOR BREAST RECONSTRUCTION   TUBAL LIGATION     1999   VARICOSE VEIN SURGERY     [facility]   No family history of bleeding disorders, wound healing problems or difficulty with anesthesia.  Social Connections: Not on file    Current Outpatient Medications:    ASPIRIN 81 PO, Take 81 mg by mouth every other day., Disp: , Rfl:    Calcium Carb-Cholecalciferol (CALCIUM + VITAMIN D3 PO), Take 1,200 mg by mouth daily., Disp: , Rfl:    estradiol  (ESTRACE ) 0.1 MG/GM vaginal cream, Insert 0.5 gram twice a week by vaginal route., Disp: 42.5 g, Rfl: 4   fluticasone (FLONASE) 50 MCG/ACT nasal spray, Place into both nostrils as needed for allergies or rhinitis., Disp: , Rfl:    levocetirizine (XYZAL ) 5 MG tablet, TAKE 1 TABLET BY MOUTH EVERY DAY IN THE MORNING, Disp: 90 tablet, Rfl: 3   loratadine (CLARITIN) 10 MG tablet, Take 10 mg by mouth daily as needed., Disp: , Rfl:    Magnesium 200 MG TABS, Take by oral route., Disp: , Rfl:    montelukast  (SINGULAIR ) 10 MG tablet, TAKE 1 TABLET BY MOUTH AT BEDTIME AS NEEDED., Disp: 90 tablet, Rfl: 1   raloxifene  (EVISTA ) 60 MG tablet, TAKE 1 TABLET BY MOUTH ONCE DAILY, Disp:  90 tablet, Rfl: 4   zolpidem  (AMBIEN ) 10 MG tablet, TAKE 1 TABLET BY MOUTH EVERY NIGHT AT BEDTIME AS NEEDED, Disp: 15 tablet, Rfl: 1   Calcium Citrate-Vitamin D3 (CALCITRATE) 315-6.25 MG-MCG TABS, , Disp: , Rfl:    Calcium-Magnesium-Vitamin D  (CITRACAL CALCIUM+D) 600-40-500 MG-MG-UNIT TB24, Take 1 tablet by mouth 2 (two) times daily. osteopenia (Patient not taking: Reported on 03/04/2024), Disp: , Rfl:    Cholecalciferol (VITAMIN D -3 PO), Every other day (Patient not taking: Reported on 03/04/2024), Disp: , Rfl:  A complete ROS was performed with pertinent positives/negatives noted in the HPI. The remainder of the ROS are negative.   Physical Exam:  BP 97/63 (BP Location: Left Arm, Patient Position: Sitting, Cuff Size: Normal)   Pulse 78   Wt 140 lb (63.5 kg)   LMP 03/02/2011   SpO2 96%   BMI 22.60 kg/m  General: Well developed, well nourished. No acute distress. Head/Face: Normocephalic. No sinus tenderness. Facial nerve intact and equal bilaterally. No facial lacerations. Eyes:  PERRL, no scleral icterus or conjunctival hemorrhage. EOMI. Ears: No gross deformity. Normal external canal. Cerumen impaction bilaterally Hearing: Normal speech reception.  Nose: Right nasolabial fold blunted at the alar-cheek junction. Palpable soft tissue mass that is soft and without any overlying skin changes. Elevation of the nasal mucosa into the nasal cavity is noted. Mass is approximately 2 cm x 2 cm. No purulent discharge.  Right inferior turbinate visualization obscured by mass. No left turbinate hypertrophy. Mouth/Oropharynx: Lips without any lesions. Dentition good. No mucosal lesions within the oropharynx. No tonsillar enlargement, exudate, or lesions. Pharyngeal walls symmetrical. Uvula midline. Tongue midline without lesions. Larynx: See TFL if applicable Nasopharynx: See TFL if applicable Neck: Trachea midline. No masses. No thyromegaly or nodules palpated. No crepitus. Lymphatic: No lymphadenopathy in  the neck. Respiratory: No stridor or distress. Room air. Cardiovascular: Regular rate and rhythm. Extremities: No edema or cyanosis. Warm and well-perfused. Skin: No scars or lesions on face or neck. Neurologic: CN II-XII grossly intact. Moving all extremities without gross abnormality. Other:   Independent Review of Additional Tests or Records: CT maxillofacial 10/17/2023 - anterior to the nasal aperture is an ovoid soft tissue mass extending medially to partially obstruct the nasal cavity, no bony destruction noted. Diameter 2cm  Procedures: None Impression & Plans: Assessment & Plan Dermoid cyst of right nasolabial fold Dermoid cyst confirmed by CT scan. Surgical removal recommended. - Reviewed CT scan - Discussed surgical intervention with patient. Discussed risks, benefits, and alternatives.   - Scheduled surgical excision via intraoral approach to avoid facial scarring. - Use absorbable sutures to minimize postoperative care and discomfort. - General anesthesia planned, considering history of itching after anesthesia and penicillin allergy. - Send excised cyst for pathology to confirm benign nature. - Discussed anticipated postoperative symptoms: bruising, swelling, tenderness, with rapid oral mucosal healing expected. - Prescribe clindamycin  for five days postoperatively due to penicillin allergy. - Provide mouth rinse postoperatively. - Advised to avoid manipulation of sutures to promote healing. - Instructed to resume aspirin the day after surgery. - Provided detailed instructions for surgery at [facility]. - Answered questions regarding surgical duration (under one hour) and postoperative care.  Penne Croak 03/04/2024 9:37 AM Available on [employer] chat and [employer]  Department of Otolaryngology Contact Info: Otolaryngology Front Desk Phone including questions about appointments or questions: [phone] - If after normal business hours (Monday-Friday  after 5PM or Weekends/Holidays), please call same number and follow prompts for Patient Access Line. There is a physician on call for urgent matters. For life threatening emergencies, please call 911

## 2024-03-04 NOTE — Patient Instructions (Signed)
 Contains text generated by [employer]bridge.

## 2024-03-05 ENCOUNTER — Other Ambulatory Visit: Payer: Self-pay

## 2024-03-05 ENCOUNTER — Encounter (HOSPITAL_BASED_OUTPATIENT_CLINIC_OR_DEPARTMENT_OTHER): Payer: Self-pay

## 2024-03-12 ENCOUNTER — Ambulatory Visit (HOSPITAL_BASED_OUTPATIENT_CLINIC_OR_DEPARTMENT_OTHER): Admit: 2024-03-12

## 2024-03-12 ENCOUNTER — Encounter (HOSPITAL_BASED_OUTPATIENT_CLINIC_OR_DEPARTMENT_OTHER): Admission: RE | Payer: Self-pay | Source: Home / Self Care

## 2024-03-12 DIAGNOSIS — Z01818 Encounter for other preprocedural examination: Secondary | ICD-10-CM

## 2024-03-12 SURGERY — EXCISION, MASS, NOSE
Anesthesia: General | Laterality: Right

## 2024-03-19 ENCOUNTER — Ambulatory Visit (INDEPENDENT_AMBULATORY_CARE_PROVIDER_SITE_OTHER)

## 2024-03-22 ENCOUNTER — Ambulatory Visit (INDEPENDENT_AMBULATORY_CARE_PROVIDER_SITE_OTHER)

## 2024-07-05 ENCOUNTER — Encounter: Admitting: Family Medicine
